# Patient Record
Sex: Male | Born: 1970 | Race: White | Hispanic: No | Marital: Married | Smoking: Never smoker
Health system: Southern US, Community
[De-identification: ages and names within clinical notes are randomized; demographics above are authoritative.]

## PROBLEM LIST (undated history)

## (undated) DIAGNOSIS — C801 Malignant (primary) neoplasm, unspecified: Secondary | ICD-10-CM

## (undated) DIAGNOSIS — N529 Male erectile dysfunction, unspecified: Secondary | ICD-10-CM

## (undated) DIAGNOSIS — G4730 Sleep apnea, unspecified: Secondary | ICD-10-CM

## (undated) HISTORY — DX: Male erectile dysfunction, unspecified: N52.9

## (undated) HISTORY — PX: SPINE SURGERY: SHX786

---

## 1998-07-24 HISTORY — PX: FOOT SURGERY: SHX648

## 2001-09-13 ENCOUNTER — Encounter: Payer: Self-pay | Admitting: Neurological Surgery

## 2001-09-13 ENCOUNTER — Ambulatory Visit (HOSPITAL_COMMUNITY): Admission: RE | Admit: 2001-09-13 | Discharge: 2001-09-13 | Payer: Self-pay | Admitting: Neurological Surgery

## 2001-10-01 ENCOUNTER — Inpatient Hospital Stay (HOSPITAL_COMMUNITY): Admission: RE | Admit: 2001-10-01 | Discharge: 2001-10-04 | Payer: Self-pay | Admitting: Neurological Surgery

## 2001-10-01 ENCOUNTER — Encounter: Payer: Self-pay | Admitting: Neurological Surgery

## 2009-12-10 DIAGNOSIS — N529 Male erectile dysfunction, unspecified: Secondary | ICD-10-CM | POA: Insufficient documentation

## 2009-12-10 DIAGNOSIS — F528 Other sexual dysfunction not due to a substance or known physiological condition: Secondary | ICD-10-CM | POA: Insufficient documentation

## 2011-09-08 ENCOUNTER — Ambulatory Visit (INDEPENDENT_AMBULATORY_CARE_PROVIDER_SITE_OTHER): Payer: 59 | Admitting: Physician Assistant

## 2011-09-08 VITALS — BP 137/80 | HR 79 | Temp 98.1°F | Resp 18 | Ht 69.0 in | Wt 248.6 lb

## 2011-09-08 DIAGNOSIS — G4700 Insomnia, unspecified: Secondary | ICD-10-CM | POA: Insufficient documentation

## 2011-09-08 DIAGNOSIS — J020 Streptococcal pharyngitis: Secondary | ICD-10-CM

## 2011-09-08 MED ORDER — PENICILLIN G BENZATHINE 1200000 UNIT/2ML IM SUSP
1.2000 10*6.[IU] | Freq: Once | INTRAMUSCULAR | Status: AC
  Administered 2011-09-08: 1.2 10*6.[IU] via INTRAMUSCULAR

## 2011-09-08 NOTE — Progress Notes (Signed)
Patient left approximately 5 min after injection of bicillin LA. He was advised by both myself and Benny Lennert that he needed to wait 15 minutes in case there was any type of reaction. Patient stated there had already been a time warp here and he had been here for hours. Patient was told again that it was not advisable but he left anyway.  [name], CMA

## 2011-09-08 NOTE — Progress Notes (Signed)
  Subjective:    Patient ID: Gerald [name], male    DOB: 1971-02-01, 41 y.o.   MRN: [id number]  HPI [facility] and tested positive for strep by culture.  He has been taking clindamycin for 4 days bid (though Rx qid) but feels no better and yesterday developed a rash (hives that were really itchy just across chest), now worried he has an allergy and very frustrated that he is not better.  He had a nose sore about 1 month ago and was put on bactroban prior to this sore throat. He has no SOB, no feelings of throat edema.     Review of Systems  Constitutional: Negative for fever and chills.  HENT: Positive for sore throat. Negative for drooling and trouble swallowing.   Respiratory: Negative for chest tightness and shortness of breath.        Objective:   Physical Exam  Constitutional: He is oriented to person, place, and time. He appears well-developed and well-nourished.  HENT:  Head: Normocephalic and atraumatic.  Right Ear: External ear normal.  Left Ear: External ear normal.  Mouth/Throat: Oropharyngeal exudate and posterior oropharyngeal erythema present.  Cardiovascular: Normal rate, regular rhythm and normal heart sounds.   Pulmonary/Chest: Effort normal and breath sounds normal. No respiratory distress. He has no wheezes.  Neurological: He is alert and oriented to person, place, and time. He has normal reflexes.  Skin: Skin is warm and dry.     Psychiatric: He has a normal mood and affect. His behavior is normal. Judgment and thought content normal.          Assessment & Plan:   1. Strep pharyngitis  penicillin g benzathine (BICILLIN LA) 1200000 UNIT/2ML injection 1.2 Million Units  2. Insomnia     I expect pt is not better due to inadequate abx dosage.  ? True allergy b/c of distribution but b/c hives will document as an allergy.  Will give Bicillin tonight.  Pt agrees with plan.   Pt immediately left against medical advice following  administration of Bicillin .  Gerald [name] asked him to stay for 15 mins after the injection of Bicillin as per protocol (and as I had instructed he would have to prior to the injection being ordered/given) but he refused and walked out of the room, stating that his 15 mins had occurred prior to the injection because we were so slow.  I could not catch the patient and try to convince him to stay.

## 2012-08-30 ENCOUNTER — Ambulatory Visit: Payer: 59

## 2012-08-30 ENCOUNTER — Ambulatory Visit (INDEPENDENT_AMBULATORY_CARE_PROVIDER_SITE_OTHER): Payer: 59 | Admitting: Emergency Medicine

## 2012-08-30 VITALS — BP 130/90 | HR 86 | Temp 98.4°F | Resp 16 | Ht 67.0 in | Wt 240.0 lb

## 2012-08-30 DIAGNOSIS — N529 Male erectile dysfunction, unspecified: Secondary | ICD-10-CM

## 2012-08-30 DIAGNOSIS — M25579 Pain in unspecified ankle and joints of unspecified foot: Secondary | ICD-10-CM

## 2012-08-30 MED ORDER — SILDENAFIL CITRATE 100 MG PO TABS: 100.0000 mg | ORAL_TABLET | Freq: Every day | ORAL | Status: DC | PRN

## 2012-08-30 MED ORDER — MELOXICAM 7.5 MG PO TABS: 7.5000 mg | ORAL_TABLET | Freq: Every day | ORAL | Status: DC

## 2012-08-30 NOTE — Progress Notes (Signed)
   [address], [place] [address]   Phone [phone]  Subjective:    Patient ID: Gerald [name], male    DOB: 06-27-1971, 42 y.o.   MRN: [id number]  HPI Pt presents to clinic with R ankle pain that he gets every once in a while.  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  He typically can get it to stop hurting and swelling by wrapping it for a couple of days but this time he feels like he needs NSAID.  He has taken no medications.  He is also on Viagra and 50mg  works great.  He wakes up in the am with soft erections and during stimulation he can get an erection but he cannot maintain one.  He would like a refill of his viagra.  His last testosterone was several years ago and it was normal.  He works on [employer] and runs his own blood every once in a while and he has normal labs.   Review of Systems  Musculoskeletal: Positive for joint swelling. Gait problem: 2nd to pain.       Objective:   Physical Exam  Vitals reviewed. Constitutional: He appears well-developed and well-nourished.  HENT:  Head: Normocephalic and atraumatic.  Right Ear: External ear normal.  Left Ear: External ear normal.  Cardiovascular: Normal rate, regular rhythm and normal heart sounds.   No murmur heard. Pulmonary/Chest: Effort normal and breath sounds normal.  Musculoskeletal:       Right ankle: He exhibits decreased range of motion and swelling (mild). He exhibits no deformity. No lateral malleolus and no medial malleolus tenderness found.       Feet:  Skin: Skin is warm and dry.  Psychiatric: He has a normal mood and affect. His behavior is normal. Judgment and thought content normal.   [facility] reading (PRIMARY) by  Dr. Cleta Alberts. Anchor screw in place - no acute findings.     Assessment & Plan:   1. Ankle pain  DG Ankle Complete Right, sildenafil (VIAGRA) 100 MG tablet,  meloxicam (MOBIC) 7.5 MG tablet  2. Impotence due to erectile dysfunction  DG Ankle Complete Right   Suggested pt RTC for CPE but doubt he will because he stated no need because his labs are ok.  Gave meds with refills. Pt to continue with brace and ice and rest.  F/u if problems.

## 2013-06-06 ENCOUNTER — Ambulatory Visit (INDEPENDENT_AMBULATORY_CARE_PROVIDER_SITE_OTHER): Payer: 59 | Admitting: Emergency Medicine

## 2013-06-06 VITALS — BP 110/80 | HR 76 | Temp 98.4°F | Resp 16 | Ht 69.0 in | Wt 254.0 lb

## 2013-06-06 DIAGNOSIS — J018 Other acute sinusitis: Secondary | ICD-10-CM

## 2013-06-06 DIAGNOSIS — J309 Allergic rhinitis, unspecified: Secondary | ICD-10-CM

## 2013-06-06 MED ORDER — PSEUDOEPHEDRINE-GUAIFENESIN ER 60-600 MG PO TB12: 1.0000 | ORAL_TABLET | Freq: Two times a day (BID) | ORAL | Status: AC

## 2013-06-06 MED ORDER — FLUTICASONE PROPIONATE 50 MCG/ACT NA SUSP: 2.0000 | Freq: Every day | NASAL | Status: DC

## 2013-06-06 MED ORDER — AMOXICILLIN-POT CLAVULANATE 875-125 MG PO TABS: 1.0000 | ORAL_TABLET | Freq: Two times a day (BID) | ORAL | Status: DC

## 2013-06-06 NOTE — Progress Notes (Signed)
[facility] [address], [place] [address] [phone]  Date:  06/06/2013   Name:  Gerald [name]   DOB:  [birth year]   MRN:  [id number]  PCP:  No primary provider on file.    Chief Complaint: Sore Throat, Diarrhea and Head congestion   History of Present Illness:  Gerald [name] is a 42 y.o. very pleasant male patient who presents with the following:  Purulent nasal drainage with congestion and a post nasal drainage.  Has a sore throat.  No fever or chills. No nausea or vomiting.  No rash has a cough that is productive of purulent sputum occasionally.  No wheezing or shortness of breath.  No improvement with over the counter medications or other home remedies. Denies other complaint or health concern today.   Patient Active Problem List   Diagnosis Date Noted  . Impotence due to erectile dysfunction 08/30/2012  . Insomnia 09/08/2011    History reviewed. No pertinent past medical history.  Past Surgical History  Procedure Laterality Date  . Spine surgery    . Foot surgery  2000    extra nivicular removal    History  Substance Use Topics  . Smoking status: Never Smoker   . Smokeless tobacco: Not on file  . Alcohol Use: Not on file    History reviewed. No pertinent family history.  No Known Allergies  Medication list has been reviewed and updated.  Current Outpatient Prescriptions on File Prior to Visit  Medication Sig Dispense Refill  . sildenafil (VIAGRA) 100 MG tablet Take 1 tablet (100 mg total) by mouth daily as needed.  8 tablet  1   No current facility-administered medications on file prior to visit.    Review of Systems:  As per HPI, otherwise negative.    Physical Examination: Filed Vitals:   06/06/13 1302  BP: 110/80  Pulse: 76  Temp: 98.4 F (36.9 C)  Resp: 16   Filed Vitals:   06/06/13 1302  Height: 5\' 9"  (1.753 m)  Weight: 254 lb (115.214 kg)   Body mass index is 37.49 kg/(m^2). Ideal Body Weight: Weight in  (lb) to have BMI = 25: 168.9  GEN: WDWN, NAD, Non-toxic, A & O x 3 HEENT: Atraumatic, Normocephalic. Neck supple. No masses, No LAD. Ears and Nose: No external deformity. CV: RRR, No M/G/R. No JVD. No thrill. No extra heart sounds. PULM: CTA B, no wheezes, crackles, rhonchi. No retractions. No resp. distress. No accessory muscle use. ABD: S, NT, ND, +BS. No rebound. No HSM. EXTR: No c/c/e NEURO Normal gait.  PSYCH: Normally interactive. Conversant. Not depressed or anxious appearing.  Calm demeanor.    Assessment and Plan: Sinusitis augmentin mucinex flonase  Signed,  Phillips Odor, MD

## 2013-06-06 NOTE — Patient Instructions (Signed)

## 2014-10-14 ENCOUNTER — Encounter (HOSPITAL_COMMUNITY): Payer: Self-pay | Admitting: Emergency Medicine

## 2014-10-14 ENCOUNTER — Emergency Department (HOSPITAL_COMMUNITY)
Admission: EM | Admit: 2014-10-14 | Discharge: 2014-10-14 | Disposition: A | Discharge status: Home / Self Care | Payer: 59 | Attending: Emergency Medicine | Admitting: Emergency Medicine

## 2014-10-14 DIAGNOSIS — L02215 Cutaneous abscess of perineum: Secondary | ICD-10-CM | POA: Diagnosis present

## 2014-10-14 DIAGNOSIS — L03818 Cellulitis of other sites: Secondary | ICD-10-CM | POA: Insufficient documentation

## 2014-10-14 DIAGNOSIS — Z7951 Long term (current) use of inhaled steroids: Secondary | ICD-10-CM | POA: Diagnosis not present

## 2014-10-14 DIAGNOSIS — Z792 Long term (current) use of antibiotics: Secondary | ICD-10-CM | POA: Insufficient documentation

## 2014-10-14 MED ORDER — LIDOCAINE-EPINEPHRINE 2 %-1:100000 IJ SOLN
20.0000 mL | Freq: Once | INTRAMUSCULAR | Status: AC
  Administered 2014-10-14: 20 mL
  Filled 2014-10-14: qty 1

## 2014-10-14 MED ORDER — HYDROCODONE-ACETAMINOPHEN 5-325 MG PO TABS: 1.0000 | ORAL_TABLET | ORAL | Status: DC | PRN

## 2014-10-14 NOTE — ED Notes (Signed)
Pt states he has an abcess behind his scrotum. Was seen yesterday at [facility] and had it lanced but states that the PA was not able to get much out of it. Is on antibiotics but states it has doubled in size. Alert and oriented.

## 2014-10-14 NOTE — ED Provider Notes (Signed)
CSN: [id number]     Arrival date & time 10/14/14  0148 History   First MD Initiated Contact with Patient 10/14/14 [phone]     Chief Complaint  Patient presents with  . Skin Abcess      (Consider location/radiation/quality/duration/timing/severity/associated sxs/prior Treatment) HPI Comments: Patient with abscess on abdominal wall in the mons area  And one under scrotum not involving scrotal wall  Had these lanced wt [facility] and started on Sulfa and T#3 without relief of discomfort  Denies dysuria, constipation testicular pain   The history is provided by the patient.    History reviewed. No pertinent past medical history. Past Surgical History  Procedure Laterality Date  . Spine surgery    . Foot surgery  2000    extra nivicular removal   History reviewed. No pertinent family history. History  Substance Use Topics  . Smoking status: Never Smoker   . Smokeless tobacco: Not on file  . Alcohol Use: Not on file    Review of Systems  Constitutional: Negative for fever.  Genitourinary: Negative for dysuria, frequency, hematuria, flank pain, discharge, penile swelling, scrotal swelling, genital sores, penile pain and testicular pain.  Skin: Positive for wound.  All other systems reviewed and are negative.     Allergies  Review of patient's allergies indicates no known allergies.  Home Medications   Prior to Admission medications   Medication Sig Start Date End Date Taking? Authorizing Provider  amoxicillin-clavulanate (AUGMENTIN) 875-125 MG per tablet Take 1 tablet by mouth 2 (two) times daily. 06/06/13   Roselee Culver, MD  fluticasone (FLONASE) 50 MCG/ACT nasal spray Place 2 sprays into both nostrils daily. 06/06/13   Roselee Culver, MD  HYDROcodone-acetaminophen (NORCO/VICODIN) 5-325 MG per tablet Take 1-2 tablets by mouth every 4 (four) hours as needed for moderate pain. 10/14/14   Junius Creamer, NP  sildenafil (VIAGRA) 100 MG tablet Take 1 tablet (100 mg total) by mouth  daily as needed. 08/30/12   Mancel Bale, PA-C   BP 153/98 mmHg  Pulse 113  Temp(Src) 98.8 F (37.1 C) (Oral)  SpO2 98% Physical Exam  Constitutional: He is oriented to person, place, and time. He appears well-developed and well-nourished.  HENT:  Head: Normocephalic.  Eyes: Pupils are equal, round, and reactive to light.  Neck: Normal range of motion.  Cardiovascular: Normal rate.   Pulmonary/Chest: Effort normal.  Abdominal: Soft. He exhibits no distension. There is no tenderness.  Genitourinary: Penis normal.    Right testis shows no tenderness. Cremasteric reflex is not absent on the right side. Left testis shows no tenderness. Cremasteric reflex is not absent on the left side. No penile tenderness.     Musculoskeletal: Normal range of motion.  Neurological: He is alert and oriented to person, place, and time.  Skin: Skin is warm. There is erythema.  Nursing note and vitals reviewed.   ED Course  INCISION AND DRAINAGE Date/Time: 10/14/2014 3:14 AM Performed by: Junius Creamer Authorized by: Junius Creamer Consent: Verbal consent obtained. Written consent not obtained. Risks and benefits: risks, benefits and alternatives were discussed Consent given by: patient Patient understanding: patient states understanding of the procedure being performed Patient identity confirmed: verbally with patient Time out: Immediately prior to procedure a "time out" was called to verify the correct patient, procedure, equipment, support staff and site/side marked as required. Type: abscess Anesthesia: local infiltration Local anesthetic: lidocaine 1% with epinephrine Anesthetic total: 2 ml Patient sedated: no Scalpel size: 11 Needle gauge: 22 Incision type:  single straight Complexity: simple Drainage: purulent Drainage amount: moderate Wound treatment: wound left open Patient tolerance: Patient tolerated the procedure well with no immediate complications Comments: I&D of perineal  abscess 3 cc lidocine with epi 11 blade.22gague needle Moderate draining Packed with 1/4 packing    (including critical care time) Labs Review Labs Reviewed - No data to display  Imaging Review No results found.   EKG Interpretation None     Patient is to continue antibiotic, warm compresses to the area 3-4 times daily He is to avoid baths, pools or ocean bathing  MDM   Final diagnoses:  Abscess of perineum  Cellulitis of other specified site         Junius Creamer, NP 10/14/14 [place], MD 10/14/14 0700

## 2014-10-14 NOTE — Discharge Instructions (Signed)
Cellulitis Cellulitis is an infection of the skin and the tissue under the skin. The infected area is usually red and tender. This happens most often in the arms and lower legs. HOME CARE   Take your antibiotic medicine as told. Finish the medicine even if you start to feel better.  Keep the infected arm or leg raised (elevated).  Put a warm cloth on the area up to 4 times per day.  Only take medicines as told by your doctor.  Keep all doctor visits as told. GET HELP IF:  You see red streaks on the skin coming from the infected area.  Your red area gets bigger or turns a dark color.  Your bone or joint under the infected area is painful after the skin heals.  Your infection comes back in the same area or different area.  You have a puffy (swollen) bump in the infected area.  You have new symptoms.  You have a fever. GET HELP RIGHT AWAY IF:   You feel very sleepy.  You throw up (vomit) or have watery poop (diarrhea).  You feel sick and have muscle aches and pains. MAKE SURE YOU:   Understand these instructions.  Will watch your condition.  Will get help right away if you are not doing well or get worse. Document Released: 12/27/2007 Document Revised: 11/24/2013 Document Reviewed: 09/25/2011 [facility] Patient Information 2015 [place], [place]. This information is not intended to replace advice given to you by your health care provider. Make sure you discuss any questions you have with your health care provider.  Abscess Care After An abscess (also called a boil or furuncle) is an infected area that contains a collection of pus. Signs and symptoms of an abscess include pain, tenderness, redness, or hardness, or you may feel a moveable soft area under your skin. An abscess can occur anywhere in the body. The infection may spread to surrounding tissues causing cellulitis. A cut (incision) by the surgeon was made over your abscess and the pus was drained out. Gauze may have  been packed into the space to provide a drain that will allow the cavity to heal from the inside outwards. The boil may be painful for 5 to 7 days. Most people with a boil do not have high fevers. Your abscess, if seen early, may not have localized, and may not have been lanced. If not, another appointment may be required for this if it does not get better on its own or with medications. HOME CARE INSTRUCTIONS   Only take over-the-counter or prescription medicines for pain, discomfort, or fever as directed by your caregiver.  When you bathe, soak and then remove gauze or iodoform packs at least daily or as directed by your caregiver. You may then wash the wound gently with mild soapy water. Repack with gauze or do as your caregiver directs. SEEK IMMEDIATE MEDICAL CARE IF:   You develop increased pain, swelling, redness, drainage, or bleeding in the wound site.  You develop signs of generalized infection including muscle aches, chills, fever, or a general ill feeling.  An oral temperature above 102 F (38.9 C) develops, not controlled by medication. See your caregiver for a recheck if you develop any of the symptoms described above. If medications (antibiotics) were prescribed, take them as directed. Document Released: 01/26/2005 Document Revised: 10/02/2011 Document Reviewed: 09/23/2007 [facility] Patient Information 2015 [place], [place]. This information is not intended to replace advice given to you by your health care provider. Make sure you discuss  any questions you have with your health care provider.  You may shower but no sitting in water. Pool or ocean til no longer draining

## 2020-10-26 ENCOUNTER — Other Ambulatory Visit: Payer: Self-pay | Admitting: Otolaryngology

## 2020-10-26 DIAGNOSIS — K1120 Sialoadenitis, unspecified: Secondary | ICD-10-CM

## 2020-11-09 ENCOUNTER — Ambulatory Visit
Admission: RE | Admit: 2020-11-09 | Discharge: 2020-11-09 | Disposition: A | Discharge status: Home / Self Care | Payer: Managed Care, Other (non HMO) | Source: Ambulatory Visit | Attending: Otolaryngology | Admitting: Otolaryngology

## 2020-11-09 ENCOUNTER — Other Ambulatory Visit: Payer: Self-pay

## 2020-11-09 DIAGNOSIS — K1120 Sialoadenitis, unspecified: Secondary | ICD-10-CM

## 2020-11-09 MED ORDER — IOPAMIDOL (ISOVUE-300) INJECTION 61%
75.0000 mL | Freq: Once | INTRAVENOUS | Status: AC | PRN
  Administered 2020-11-09: 75 mL via INTRAVENOUS

## 2020-11-11 ENCOUNTER — Encounter (HOSPITAL_COMMUNITY): Payer: Self-pay

## 2020-11-11 ENCOUNTER — Ambulatory Visit (HOSPITAL_COMMUNITY)
Admission: EM | Admit: 2020-11-11 | Discharge: 2020-11-11 | Disposition: A | Discharge status: Home / Self Care | Payer: Managed Care, Other (non HMO)

## 2020-11-11 ENCOUNTER — Encounter (HOSPITAL_COMMUNITY): Payer: Self-pay | Admitting: Emergency Medicine

## 2020-11-11 ENCOUNTER — Other Ambulatory Visit: Payer: Self-pay

## 2020-11-11 ENCOUNTER — Emergency Department (HOSPITAL_COMMUNITY)
Admission: EM | Admit: 2020-11-11 | Discharge: 2020-11-11 | Disposition: A | Discharge status: Home / Self Care | Payer: Managed Care, Other (non HMO) | Attending: Emergency Medicine | Admitting: Emergency Medicine

## 2020-11-11 ENCOUNTER — Emergency Department (HOSPITAL_COMMUNITY): Payer: Managed Care, Other (non HMO)

## 2020-11-11 DIAGNOSIS — H9209 Otalgia, unspecified ear: Secondary | ICD-10-CM | POA: Insufficient documentation

## 2020-11-11 DIAGNOSIS — R070 Pain in throat: Secondary | ICD-10-CM | POA: Diagnosis not present

## 2020-11-11 DIAGNOSIS — Z5321 Procedure and treatment not carried out due to patient leaving prior to being seen by health care provider: Secondary | ICD-10-CM | POA: Insufficient documentation

## 2020-11-11 DIAGNOSIS — J392 Other diseases of pharynx: Secondary | ICD-10-CM

## 2020-11-11 DIAGNOSIS — R042 Hemoptysis: Secondary | ICD-10-CM | POA: Diagnosis not present

## 2020-11-11 LAB — CBC WITH DIFFERENTIAL/PLATELET
Abs Immature Granulocytes: 0.03 10*3/uL (ref 0.00–0.07)
Basophils Absolute: 0.1 10*3/uL (ref 0.0–0.1)
Basophils Relative: 1 %
Eosinophils Absolute: 0.2 10*3/uL (ref 0.0–0.5)
Eosinophils Relative: 2 %
HCT: 44.2 % (ref 39.0–52.0)
Hemoglobin: 15.5 g/dL (ref 13.0–17.0)
Immature Granulocytes: 0 %
Lymphocytes Relative: 30 %
Lymphs Abs: 3.1 10*3/uL (ref 0.7–4.0)
MCH: 33.6 pg (ref 26.0–34.0)
MCHC: 35.1 g/dL (ref 30.0–36.0)
MCV: 95.9 fL (ref 80.0–100.0)
Monocytes Absolute: 0.6 10*3/uL (ref 0.1–1.0)
Monocytes Relative: 6 %
Neutro Abs: 6.2 10*3/uL (ref 1.7–7.7)
Neutrophils Relative %: 61 %
Platelets: 251 10*3/uL (ref 150–400)
RBC: 4.61 MIL/uL (ref 4.22–5.81)
RDW: 12.3 % (ref 11.5–15.5)
WBC: 10.1 10*3/uL (ref 4.0–10.5)
nRBC: 0 % (ref 0.0–0.2)

## 2020-11-11 LAB — BASIC METABOLIC PANEL
Anion gap: 6 (ref 5–15)
BUN: 10 mg/dL (ref 6–20)
CO2: 26 mmol/L (ref 22–32)
Calcium: 8.8 mg/dL — ABNORMAL LOW (ref 8.9–10.3)
Chloride: 105 mmol/L (ref 98–111)
Creatinine, Ser: 1.09 mg/dL (ref 0.61–1.24)
GFR, Estimated: >60 mL/min (ref >60)
Glucose, Bld: 91 mg/dL (ref 70–99)
Potassium: 3.9 mmol/L (ref 3.5–5.1)
Sodium: 137 mmol/L (ref 135–145)

## 2020-11-11 NOTE — ED Triage Notes (Signed)
Emergency Medicine Provider Triage Evaluation Note  Andon Villard , a 50 y.o. male  was evaluated in triage.  Pt complains of coughing up blood today. Recently diagnosed with tonsil mass on CT, his ENT (Dr. Melene Plan) is out of town, scheduled for appointment on Monday. Has area to left posterior tongue that is bleeding, thinks this is why he coughed up blood but came in to be sure.  Review of Systems  Positive: hemoptysis  Negative: Difficulty breathing  Physical Exam  BP (!) 148/107 (BP Location: Right Arm)   Pulse 83   Temp 98.8 F (37.1 C) (Oral)   Resp 20   SpO2 98%  Gen:   Awake, no distress   HEENT:  Macerated area to left posterior tongue, no visible bleeding  Resp:  Normal effort  Cardiac:  Normal rate  Abd:   Nondistended, nontender  MSK:   Moves extremities without difficulty  Neuro:  Speech clear   Medical Decision Making  Medically screening exam initiated at 7:41 PM.  Appropriate orders placed.  Awad Gladd was informed that the remainder of the evaluation will be completed by another provider, this initial triage assessment does not replace that evaluation, and the importance of remaining in the ED until their evaluation is complete.  Clinical Impression     Roque Lias 11/11/20 1943

## 2020-11-11 NOTE — ED Provider Notes (Signed)
[facility] [place]    CSN: [id number] Arrival date & time: 11/11/20  1830      History   Chief Complaint Chief Complaint  Patient presents with  . Cough  . Hemoptysis    HPI Gerald [name] is a 50 y.o. male presenting with hemoptysis and throat pain.  This patient is currently being evaluated for neck mass by ENT, CT was performed 2 days ago with results as below.  Patient states that he is currently followed by ENT for 6 months of a neck mass and sore throat.  States that he has been treated for sialadenitis with multiple rounds of antibiotics without improvement.  States that he noticed taste of blood in his mouth 4 days ago, and has coughed up several blood clots.  Denies cough or respiratory symptoms, states that he thinks the blood is coming from a lesion at the base of his tongue.  Denies shortness of breath, trouble swallowing, dizziness, chest pain.  States he did call his ear nose and throat doctor but they are on vacation and cannot see him.  HPI  History reviewed. No pertinent past medical history.  Patient Active Problem List   Diagnosis Date Noted  . Impotence due to erectile dysfunction 08/30/2012  . Insomnia 09/08/2011    Past Surgical History:  Procedure Laterality Date  . FOOT SURGERY  2000   extra nivicular removal  . SPINE SURGERY         Home Medications    Prior to Admission medications   Medication Sig Start Date End Date Taking? Authorizing Provider  fluticasone (FLONASE) 50 MCG/ACT nasal spray Place 2 sprays into both nostrils daily. 06/06/13 11/11/20  Roselee Culver, MD  sildenafil (VIAGRA) 100 MG tablet Take 1 tablet (100 mg total) by mouth daily as needed. 08/30/12 11/11/20  Mancel Bale, PA-C    Family History No family history on file.  Social History Social History   Tobacco Use  . Smoking status: Never Smoker  . Smokeless tobacco: Never Used  Vaping Use  . Vaping Use: Never used  Substance Use Topics  . Alcohol use: Yes     Comment: rare  . Drug use: Never     Allergies   Patient has no known allergies.   Review of Systems Review of Systems  Musculoskeletal:       Tongue lesion Neck mass  All other systems reviewed and are negative.    Physical Exam Triage Vital Signs ED Triage Vitals  Enc Vitals Group     BP      Pulse      Resp      Temp      Temp src      SpO2      Weight      Height      Head Circumference      Peak Flow      Pain Score      Pain Loc      Pain Edu?      Excl. in [place]?    No data found.  Updated Vital Signs BP 130/85 (BP Location: Right Arm) Comment (BP Location): large cuff  Pulse 79   Temp 99.2 F (37.3 C) (Oral)   Resp 20   Visual Acuity Right Eye Distance:   Left Eye Distance:   Bilateral Distance:    Right Eye Near:   Left Eye Near:    Bilateral Near:     Physical Exam Vitals reviewed.  Constitutional:  General: He is not in acute distress.    Appearance: Normal appearance. He is not ill-appearing or diaphoretic.  HENT:     Head: Normocephalic and atraumatic.     Mouth/Throat:      Comments: Base of L tongue with are of bleeding and friability  Eyes:     Extraocular Movements: Extraocular movements intact.     Pupils: Pupils are equal, round, and reactive to light.  Neck:     Comments: Area of swelling and tenderness L chin Cardiovascular:     Rate and Rhythm: Normal rate and regular rhythm.     Heart sounds: Normal heart sounds.  Pulmonary:     Effort: Pulmonary effort is normal.     Breath sounds: Normal breath sounds.  Skin:    General: Skin is warm.  Neurological:     General: No focal deficit present.     Mental Status: He is alert and oriented to person, place, and time.  Psychiatric:        Mood and Affect: Mood normal.        Behavior: Behavior normal.        Thought Content: Thought content normal.        Judgment: Judgment normal.      UC Treatments / Results  Labs (all labs ordered are listed, but only  abnormal results are displayed) Labs Reviewed - No data to display  EKG   Radiology No results found.  Procedures Procedures (including critical care time)  Medications Ordered in UC Medications - No data to display  Initial Impression / Assessment and Plan / UC Course  I have reviewed the triage vital signs and the nursing notes.  Pertinent labs & imaging results that were available during my care of the patient were reviewed by me and considered in my medical decision making (see chart for details).     This patient is a 50 year old male presenting with neck mass x6 months and "hemoptysis" x4 days. Today this pt is afebrile nontachycardic nontachypneic, oxygenating well on room air, no wheezes rhonchi or rales. This patient has been followed by ENT for 6 months for "sialadenitis" and has been treated with multiple rounds of abx per pt. Unfortunately I do not have access to their notes. 4/19 CT as below; pt has not been given these results yet. On exam, there is a lesion at the base of his tongue that appears to be bleeding. However, I cannot rule out additional sources of bleeding and airway compromise, and so I am recommending this patient head to [facility] ED for further evaluation and management. He is in agreement with this treatment plan. He is hemodynamically stable for transport to ED in personal vehicle at this time.   CT neck 11/09/20 IMPRESSION: 1. 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 2. 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  Final Clinical Impressions(s) / UC Diagnoses   Final diagnoses:  Pharyngeal lesion     Discharge Instructions     -Head straight to [facility] ED for further evaluation of coughing up blood     ED Prescriptions    None     PDMP not reviewed this encounter.   Hazel Sams, PA-C 11/11/20 1923

## 2020-11-11 NOTE — ED Notes (Signed)
Notified laura, pa of patient's complaint

## 2020-11-11 NOTE — ED Triage Notes (Signed)
Pt states he is seeing ENT for a lump in throat, states it may be a salivary gland stone. Today while standing in garage on phone, he started coughing up blood. Pt has picture of bright red small blood clot he coughed up. Pt states he had same a few days ago but it went away. Pt c/o throat and ear pain.

## 2020-11-11 NOTE — Discharge Instructions (Signed)
-  Head straight to [facility] ED for further evaluation of coughing up blood

## 2020-11-11 NOTE — ED Triage Notes (Addendum)
Patient has been seeing an ent for a lump on left side of neck.  Patient had a ct on Monday.  Patient had blood streaks to sputum on Monday, ent did not agree to appt.  This afternoon, patient coughed up small clot.  Patient reports increased pain and taste of blood in left side of throat.  Patient has picture of bright red, small clot on phone

## 2020-11-11 NOTE — ED Notes (Signed)
Patient is being discharged from the Urgent Care and sent to the Emergency Department via POV . Per Olene Craven, PA, patient is in need of higher level of care due to coughing up blood. Patient is aware and verbalizes understanding of plan of care.  Vitals:   11/11/20 1859  BP: 130/85  Pulse: 79  Resp: 20  Temp: 99.2 F (37.3 C)

## 2020-11-11 NOTE — ED Notes (Signed)
Pt inquiring about wait time, pt stated that he has stopped spitting up blood and wants to leave. Pt advised against leaving, but he decided to leave.

## 2020-11-12 ENCOUNTER — Telehealth: Payer: Managed Care, Other (non HMO) | Admitting: Physician Assistant

## 2020-11-12 DIAGNOSIS — J358 Other chronic diseases of tonsils and adenoids: Secondary | ICD-10-CM | POA: Diagnosis not present

## 2020-11-12 NOTE — Progress Notes (Signed)
Mr. Gerald [name], Gerald [name] are scheduled for a virtual visit with your provider today.    Just as we do with appointments in the office, we must obtain your consent to participate.  Your consent will be active for this visit and any virtual visit you may have with one of our providers in the next 365 days.    If you have a MyChart account, I can also send a copy of this consent to you electronically.  All virtual visits are billed to your insurance company just like a traditional visit in the office.  As this is a virtual visit, video technology does not allow for your provider to perform a traditional examination.  This may limit your provider's ability to fully assess your condition.  If your provider identifies any concerns that need to be evaluated in person or the need to arrange testing such as labs, EKG, etc, we will make arrangements to do so.    Although advances in technology are sophisticated, we cannot ensure that it will always work on either your end or our end.  If the connection with a video visit is poor, we may have to switch to a telephone visit.  With either a video or telephone visit, we are not always able to ensure that we have a secure connection.   I need to obtain your verbal consent now.   Are you willing to proceed with your visit today?   Gerald [name] has provided verbal consent on 11/12/2020 for a virtual visit (video or telephone).  Gerald Rio, PA-C 11/12/2020  9:32 AM  Virtual Visit via Video   I connected with patient on 11/12/20 at  9:30 AM EDT by a video enabled telemedicine application and verified that I am speaking with the correct person using two identifiers.  Location patient: Home Location provider: [place] participating in the virtual visit: Patient, Provider  I discussed the limitations of evaluation and management by telemedicine and the availability of in person appointments. The patient expressed understanding and agreed to  proceed.  Subjective:   HPI:   Patient presents via [facility] today with concerns regarding a recent CT head and neck that he had on 11/09/2020. Notes he has been dealing with ongoing issue of left sided sore throat with a palpable mass of the neck. Was initially diagnosed with sialadenitis and treated with rounds of antibiotics without improvement. Was referred to ENT, Dr. Leta Baptist, who ordered the CT scan 10/26/2020. Patient states yesterday he noted he was getting blood up when coughing so went to a local Urgent Care for evaluation. Workup revealed a friable ulcerated lesion in the back of this throat. Was sent to ER for further evaluation giving hemoptysis. ER workup included labs (overall unremarkable except for borderline low calcium) and CXR which was negative for any acute findings or mass. Patient left after waiting over 5 hours. No residual hemoptysis. States he is just very worried as he had not been contacted by his ENT for CT result, noting he is now able to see it on MyChart and sees mention of malignant mass and lymph nodes. He contacted his ENT office but the provider is out of state until Sunday and they told him there was no covering provider. He states he is very concerned and does not know if he needs to wait until his follow-up appointment Monday or go back to the ER. He says he is very upset that he had his CT earlier in the  week and no one told him the provider would not be in to review the test.     ROS:   See pertinent positives and negatives per HPI.  Patient Active Problem List   Diagnosis Date Noted  . Impotence due to erectile dysfunction 08/30/2012  . Insomnia 09/08/2011    Social History   Tobacco Use  . Smoking status: Never Smoker  . Smokeless tobacco: Never Used  Substance Use Topics  . Alcohol use: Yes    Comment: rare   No current outpatient medications on file.  No Known Allergies  Objective:   There were no vitals taken for this visit.  Patient is  well-developed, well-nourished in no acute distress. Mildly anxious.  Resting comfortably at home.  Head is normocephalic, atraumatic.  No labored breathing.  Speech is clear and coherent with logical content.  Patient is alert and oriented at baseline.   Assessment and Plan:   1. 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 No contralateral adenopathy. Patient had already seen results via MyChart and unable to get advice from ENT as provider is out of town. While on video I encouraged patient not to go to the ER unless there was any significant issue -- recurrence of hemoptysis, fever, chills, etc, as they will not likely do anything except have him follow-up with his specialist. I contacted practice who verified provider is out of town and not able to receive messages and that there was no covering provider. Made sure patient had appointment Monday for further evaluation and management. RN agreed to answer any other questions the patient had in regards to delay in result response. Called patient back and reviewed this with him in detail. He is currently between PCP and recommendations given for [facility] primary providers giving his interest.  Requested patient send me a message to let me know he is doing and if he needs any other recommendations for a primary care provider.   Gerald [name], [place] 11/12/2020

## 2020-11-16 ENCOUNTER — Other Ambulatory Visit: Payer: Self-pay

## 2020-11-16 ENCOUNTER — Encounter (HOSPITAL_BASED_OUTPATIENT_CLINIC_OR_DEPARTMENT_OTHER): Payer: Self-pay | Admitting: Otolaryngology

## 2020-11-16 ENCOUNTER — Other Ambulatory Visit: Payer: Self-pay | Admitting: Otolaryngology

## 2020-11-16 DIAGNOSIS — J358 Other chronic diseases of tonsils and adenoids: Secondary | ICD-10-CM

## 2020-11-16 HISTORY — DX: Other chronic diseases of tonsils and adenoids: J35.8

## 2020-11-18 ENCOUNTER — Other Ambulatory Visit (HOSPITAL_COMMUNITY): Payer: Managed Care, Other (non HMO)

## 2020-11-19 ENCOUNTER — Other Ambulatory Visit (HOSPITAL_COMMUNITY)
Admission: RE | Admit: 2020-11-19 | Discharge: 2020-11-19 | Disposition: A | Discharge status: Home / Self Care | Payer: Managed Care, Other (non HMO) | Source: Ambulatory Visit | Attending: Internal Medicine | Admitting: Internal Medicine

## 2020-11-19 DIAGNOSIS — Z01812 Encounter for preprocedural laboratory examination: Secondary | ICD-10-CM | POA: Insufficient documentation

## 2020-11-19 DIAGNOSIS — Z20822 Contact with and (suspected) exposure to covid-19: Secondary | ICD-10-CM | POA: Diagnosis not present

## 2020-11-19 LAB — SARS CORONAVIRUS 2 (TAT 6-24 HRS): SARS Coronavirus 2: NEGATIVE

## 2020-11-22 ENCOUNTER — Encounter (HOSPITAL_BASED_OUTPATIENT_CLINIC_OR_DEPARTMENT_OTHER): Payer: Self-pay | Admitting: Otolaryngology

## 2020-11-22 ENCOUNTER — Ambulatory Visit (HOSPITAL_BASED_OUTPATIENT_CLINIC_OR_DEPARTMENT_OTHER): Payer: Managed Care, Other (non HMO) | Admitting: Certified Registered"

## 2020-11-22 ENCOUNTER — Ambulatory Visit (HOSPITAL_BASED_OUTPATIENT_CLINIC_OR_DEPARTMENT_OTHER)
Admission: RE | Admit: 2020-11-22 | Discharge: 2020-11-22 | Disposition: A | Discharge status: Home / Self Care | Payer: Managed Care, Other (non HMO) | Source: Ambulatory Visit | Attending: Otolaryngology | Admitting: Otolaryngology

## 2020-11-22 ENCOUNTER — Encounter (HOSPITAL_BASED_OUTPATIENT_CLINIC_OR_DEPARTMENT_OTHER)
Admission: RE | Disposition: A | Discharge status: Home / Self Care | Payer: Self-pay | Source: Ambulatory Visit | Attending: Otolaryngology

## 2020-11-22 DIAGNOSIS — J359 Chronic disease of tonsils and adenoids, unspecified: Secondary | ICD-10-CM | POA: Insufficient documentation

## 2020-11-22 DIAGNOSIS — C090 Malignant neoplasm of tonsillar fossa: Secondary | ICD-10-CM | POA: Insufficient documentation

## 2020-11-22 DIAGNOSIS — C01 Malignant neoplasm of base of tongue: Secondary | ICD-10-CM | POA: Diagnosis not present

## 2020-11-22 HISTORY — PX: DIRECT LARYNGOSCOPY: SHX5326

## 2020-11-22 HISTORY — DX: Sleep apnea, unspecified: G47.30

## 2020-11-22 HISTORY — PX: TONSILLECTOMY: SHX5217

## 2020-11-22 SURGERY — TONSILLECTOMY
Anesthesia: General | Site: Throat

## 2020-11-22 MED ORDER — HYDROMORPHONE HCL 1 MG/ML IJ SOLN
INTRAMUSCULAR | Status: AC
  Filled 2020-11-22: qty 0.5

## 2020-11-22 MED ORDER — PROPOFOL 10 MG/ML IV BOLUS
INTRAVENOUS | Status: DC | PRN
  Administered 2020-11-22: 200 mg via INTRAVENOUS

## 2020-11-22 MED ORDER — ONDANSETRON HCL 4 MG/2ML IJ SOLN
INTRAMUSCULAR | Status: DC | PRN
  Administered 2020-11-22: 4 mg via INTRAVENOUS

## 2020-11-22 MED ORDER — HYDROMORPHONE HCL 1 MG/ML IJ SOLN
0.2500 mg | INTRAMUSCULAR | Status: DC | PRN
Start: 2020-11-22 — End: 2020-11-22
  Administered 2020-11-22 (×3): 0.5 mg via INTRAVENOUS

## 2020-11-22 MED ORDER — LIDOCAINE-EPINEPHRINE 1 %-1:100000 IJ SOLN
INTRAMUSCULAR | Status: AC
  Filled 2020-11-22: qty 1

## 2020-11-22 MED ORDER — SUGAMMADEX SODIUM 500 MG/5ML IV SOLN
INTRAVENOUS | Status: DC | PRN
  Administered 2020-11-22: 300 mg via INTRAVENOUS

## 2020-11-22 MED ORDER — MIDAZOLAM HCL 2 MG/2ML IJ SOLN
INTRAMUSCULAR | Status: AC
  Filled 2020-11-22: qty 2

## 2020-11-22 MED ORDER — ROCURONIUM BROMIDE 10 MG/ML (PF) SYRINGE
PREFILLED_SYRINGE | INTRAVENOUS | Status: AC
  Filled 2020-11-22: qty 10

## 2020-11-22 MED ORDER — AMISULPRIDE (ANTIEMETIC) 5 MG/2ML IV SOLN: 10.0000 mg | Freq: Once | INTRAVENOUS | Status: DC | PRN

## 2020-11-22 MED ORDER — MUPIROCIN 2 % EX OINT
TOPICAL_OINTMENT | CUTANEOUS | Status: AC
  Filled 2020-11-22: qty 22

## 2020-11-22 MED ORDER — SODIUM CHLORIDE 0.9 % IR SOLN
Status: DC | PRN
  Administered 2020-11-22: 300 mL

## 2020-11-22 MED ORDER — DEXAMETHASONE SODIUM PHOSPHATE 4 MG/ML IJ SOLN
INTRAMUSCULAR | Status: DC | PRN
  Administered 2020-11-22: 10 mg via INTRAVENOUS

## 2020-11-22 MED ORDER — BACITRACIN ZINC 500 UNIT/GM EX OINT
TOPICAL_OINTMENT | CUTANEOUS | Status: AC
  Filled 2020-11-22: qty 0.9

## 2020-11-22 MED ORDER — OXYMETAZOLINE HCL 0.05 % NA SOLN
NASAL | Status: AC
  Filled 2020-11-22: qty 90

## 2020-11-22 MED ORDER — FENTANYL CITRATE (PF) 100 MCG/2ML IJ SOLN
INTRAMUSCULAR | Status: DC | PRN
  Administered 2020-11-22: 100 ug via INTRAVENOUS

## 2020-11-22 MED ORDER — FENTANYL CITRATE (PF) 100 MCG/2ML IJ SOLN
INTRAMUSCULAR | Status: AC
  Filled 2020-11-22: qty 2

## 2020-11-22 MED ORDER — BUPIVACAINE HCL (PF) 0.25 % IJ SOLN
INTRAMUSCULAR | Status: AC
  Filled 2020-11-22: qty 30

## 2020-11-22 MED ORDER — ONDANSETRON HCL 4 MG/2ML IJ SOLN
INTRAMUSCULAR | Status: AC
  Filled 2020-11-22: qty 2

## 2020-11-22 MED ORDER — AMOXICILLIN 400 MG/5ML PO SUSR: 800.0000 mg | Freq: Two times a day (BID) | ORAL | 0 refills | Status: AC

## 2020-11-22 MED ORDER — OXYCODONE HCL 5 MG PO TABS: 5.0000 mg | ORAL_TABLET | Freq: Once | ORAL | Status: DC | PRN

## 2020-11-22 MED ORDER — OXYCODONE HCL 5 MG/5ML PO SOLN: 5.0000 mg | Freq: Once | ORAL | Status: DC | PRN

## 2020-11-22 MED ORDER — LIDOCAINE 2% (20 MG/ML) 5 ML SYRINGE
INTRAMUSCULAR | Status: AC
  Filled 2020-11-22: qty 5

## 2020-11-22 MED ORDER — ROCURONIUM BROMIDE 100 MG/10ML IV SOLN
INTRAVENOUS | Status: DC | PRN
  Administered 2020-11-22: 100 mg via INTRAVENOUS

## 2020-11-22 MED ORDER — PROMETHAZINE HCL 25 MG/ML IJ SOLN: 6.2500 mg | INTRAMUSCULAR | Status: DC | PRN

## 2020-11-22 MED ORDER — LACTATED RINGERS IV SOLN: INTRAVENOUS | Status: DC

## 2020-11-22 MED ORDER — OXYMETAZOLINE HCL 0.05 % NA SOLN
NASAL | Status: DC | PRN
  Administered 2020-11-22: 1 via TOPICAL

## 2020-11-22 MED ORDER — MIDAZOLAM HCL 5 MG/5ML IJ SOLN
INTRAMUSCULAR | Status: DC | PRN
  Administered 2020-11-22: 2 mg via INTRAVENOUS

## 2020-11-22 MED ORDER — DEXAMETHASONE SODIUM PHOSPHATE 10 MG/ML IJ SOLN
INTRAMUSCULAR | Status: AC
  Filled 2020-11-22: qty 1

## 2020-11-22 MED ORDER — MEPERIDINE HCL 25 MG/ML IJ SOLN: 6.2500 mg | INTRAMUSCULAR | Status: DC | PRN

## 2020-11-22 MED ORDER — LIDOCAINE HCL (CARDIAC) PF 100 MG/5ML IV SOSY
PREFILLED_SYRINGE | INTRAVENOUS | Status: DC | PRN
  Administered 2020-11-22: 100 mg via INTRAVENOUS

## 2020-11-22 MED ORDER — OXYCODONE-ACETAMINOPHEN 7.5-325 MG PO TABS: 2.0000 | ORAL_TABLET | Freq: Four times a day (QID) | ORAL | 0 refills | Status: AC | PRN

## 2020-11-22 SURGICAL SUPPLY — 31 items
BNDG COHESIVE 2X5 TAN STRL LF (GAUZE/BANDAGES/DRESSINGS) IMPLANT
CANISTER SUCT 1200ML W/VALVE (MISCELLANEOUS) ×3 IMPLANT
CATH ROBINSON RED A/P 10FR (CATHETERS) IMPLANT
CATH ROBINSON RED A/P 14FR (CATHETERS) IMPLANT
COAGULATOR SUCT SWTCH 10FR 6 (ELECTROSURGICAL) IMPLANT
COVER BACK TABLE 60X90IN (DRAPES) ×3 IMPLANT
COVER MAYO STAND STRL (DRAPES) ×3 IMPLANT
COVER WAND RF STERILE (DRAPES) IMPLANT
ELECT REM PT RETURN 9FT ADLT (ELECTROSURGICAL) ×3
ELECT REM PT RETURN 9FT PED (ELECTROSURGICAL)
ELECTRODE REM PT RETRN 9FT PED (ELECTROSURGICAL) IMPLANT
ELECTRODE REM PT RTRN 9FT ADLT (ELECTROSURGICAL) IMPLANT
GAUZE SPONGE 4X4 12PLY STRL LF (GAUZE/BANDAGES/DRESSINGS) ×3 IMPLANT
GLOVE SURG ENC MOIS LTX SZ7.5 (GLOVE) ×3 IMPLANT
GLOVE SURG POLYISO LF SZ8 (GLOVE) ×1 IMPLANT
GOWN STRL REUS W/ TWL LRG LVL3 (GOWN DISPOSABLE) ×4 IMPLANT
GOWN STRL REUS W/TWL 2XL LVL3 (GOWN DISPOSABLE) ×1 IMPLANT
GOWN STRL REUS W/TWL LRG LVL3 (GOWN DISPOSABLE) ×3
IV NS 500ML (IV SOLUTION) ×3
IV NS 500ML BAXH (IV SOLUTION) ×2 IMPLANT
MARKER SKIN DUAL TIP RULER LAB (MISCELLANEOUS) IMPLANT
NS IRRIG 1000ML POUR BTL (IV SOLUTION) ×1 IMPLANT
SHEET MEDIUM DRAPE 40X70 STRL (DRAPES) ×3 IMPLANT
SOLUTION BUTLER CLEAR DIP (MISCELLANEOUS) ×3 IMPLANT
SPONGE TONSIL TAPE 1.25 RFD (DISPOSABLE) ×1 IMPLANT
SYR BULB EAR ULCER 3OZ GRN STR (SYRINGE) ×1 IMPLANT
TOWEL GREEN STERILE FF (TOWEL DISPOSABLE) ×3 IMPLANT
TUBE CONNECTING 20X1/4 (TUBING) ×3 IMPLANT
TUBE SALEM SUMP 12R W/ARV (TUBING) IMPLANT
TUBE SALEM SUMP 16 FR W/ARV (TUBING) ×1 IMPLANT
WAND COBLATOR 70 EVAC XTRA (SURGICAL WAND) ×1 IMPLANT

## 2020-11-22 NOTE — Anesthesia Postprocedure Evaluation (Signed)
Anesthesia Post Note  Patient: Gerald [name]  Procedure(s) Performed: LEFT TONSILLECTOMY (Left Throat) DIRECT LARYNGOSCOPY WITH BIOPSY (N/A Throat)     Patient location during evaluation: PACU Anesthesia Type: General Level of consciousness: awake and alert Pain management: pain level controlled Vital Signs Assessment: post-procedure vital signs reviewed and stable Respiratory status: spontaneous breathing, nonlabored ventilation and respiratory function stable Cardiovascular status: blood pressure returned to baseline and stable Postop Assessment: no apparent nausea or vomiting Anesthetic complications: no   No complications documented.  Last Vitals:  Vitals:   11/22/20 0915 11/22/20 0951  BP: 133/83 127/72  Pulse: 70 72  Resp: 15 20  Temp:  36.5 C  SpO2: 98% (!) 72%    Last Pain:  Vitals:   11/22/20 0951  TempSrc: Oral  PainSc: 2                  Lynda Rainwater

## 2020-11-22 NOTE — Anesthesia Preprocedure Evaluation (Signed)
Anesthesia Evaluation  Patient identified by MRN, date of birth, ID band Patient awake    Reviewed: Allergy & Precautions, NPO status , Patient's Chart, lab work & pertinent test results  Airway Mallampati: II  TM Distance: >3 FB Neck ROM: Full    Dental no notable dental hx.    Pulmonary sleep apnea ,    Pulmonary exam normal breath sounds clear to auscultation       Cardiovascular negative cardio ROS Normal cardiovascular exam Rhythm:Regular Rate:Normal     Neuro/Psych negative neurological ROS  negative psych ROS   GI/Hepatic negative GI ROS, Neg liver ROS,   Endo/Other  negative endocrine ROS  Renal/GU negative Renal ROS  negative genitourinary   Musculoskeletal negative musculoskeletal ROS (+)   Abdominal (+) + obese,   Peds negative pediatric ROS (+)  Hematology negative hematology ROS (+)   Anesthesia Other Findings Tonsillar Mass  Reproductive/Obstetrics negative OB ROS                             Anesthesia Physical Anesthesia Plan  ASA: II  Anesthesia Plan: General   Post-op Pain Management:    Induction: Intravenous  PONV Risk Score and Plan: 2 and Ondansetron, Midazolam and Treatment may vary due to age or medical condition  Airway Management Planned: Oral ETT  Additional Equipment:   Intra-op Plan:   Post-operative Plan: Extubation in OR  Informed Consent: I have reviewed the patients History and Physical, chart, labs and discussed the procedure including the risks, benefits and alternatives for the proposed anesthesia with the patient or authorized representative who has indicated his/her understanding and acceptance.     Dental advisory given  Plan Discussed with: CRNA  Anesthesia Plan Comments:         Anesthesia Quick Evaluation

## 2020-11-22 NOTE — Discharge Instructions (Signed)
Post Anesthesia Home Care Instructions  Activity: Get plenty of rest for the remainder of the day. A responsible individual must stay with you for 24 hours following the procedure.  For the next 24 hours, DO NOT: -Drive a car -Paediatric nurse -Drink alcoholic beverages -Take any medication unless instructed by your physician -Make any legal decisions or sign important papers.  Meals: Start with liquid foods such as gelatin or soup. Progress to regular foods as tolerated. Avoid greasy, spicy, heavy foods. If nausea and/or vomiting occur, drink only clear liquids until the nausea and/or vomiting subsides. Call your physician if vomiting continues.  Special Instructions/Symptoms: Your throat may feel dry or sore from the anesthesia or the breathing tube placed in your throat during surgery. If this causes discomfort, gargle with warm salt water. The discomfort should disappear within 24 hours.  If you had a scopolamine patch placed behind your ear for the management of post- operative nausea and/or vomiting:  1. The medication in the patch is effective for 72 hours, after which it should be removed.  Wrap patch in a tissue and discard in the trash. Wash hands thoroughly with soap and water. 2. You may remove the patch earlier than 72 hours if you experience unpleasant side effects which may include dry mouth, dizziness or visual disturbances. 3. Avoid touching the patch. Wash your hands with soap and water after contact with the patch.    -------------  Erlean Mealor Raynelle Bring M.D., P.A. Postoperative Instructions for Tonsillectomy  Activity Restrict activity at home for the first two days, resting as much as possible. Light indoor activity is best. You may usually return to school or work within a week but void strenuous activity and sports for two weeks. Sleep with your head elevated on 2-3 pillows for 3-4 days to help decrease swelling. Diet Due to tissue swelling and throat discomfort, you  may have little desire to drink for several days. However fluids are very important to prevent dehydration. You will find that non-acidic juices, soups, popsicles, Jell-O, custard, puddings, and any soft or mashed foods taken in small quantities can be swallowed fairly easily. Try to increase your fluid and food intake as the discomfort subsides. It is recommended that a child receive 1-1/2 quarts of fluid in a 24-hour period. Adult require twice this amount.  Discomfort Your sore throat may be relieved by applying an ice collar to your neck and/or by taking Tylenol. You may experience an earache, which is due to referred pain from the throat. Referred ear pain is commonly felt at night when trying to rest.  Bleeding                        Although rare, there is risk of having some bleeding during the first 2 weeks after having a T&A. This usually happens between days 7-10 postoperatively. If you or your child should have any bleeding, try to remain calm. We recommend sitting up quietly in a chair and gently spitting out the blood into a bowl. For adults, gargling gently with ice water may help. If the bleeding does not stop after a short time (5 minutes), is more than 1 teaspoonful, or if you become worried, please call our office at [phone] or go directly to the nearest hospital emergency room. Do not eat or drink anything prior to going to the hospital as you may need to be taken to the operating room in order to control the bleeding. GENERAL  CONSIDERATIONS 1. Brush your teeth regularly. Avoid mouthwashes and gargles for three weeks. You may gargle gently with warm salt-water as necessary or spray with Chloraseptic. You may make salt-water by placing 2 teaspoons of table salt into a quart of fresh water. Warm the salt-water in a microwave to a luke warm temperature.  2. Avoid exposure to colds and upper respiratory infections if possible.  3. If you look into a mirror or into your child's mouth,  you will see white-gray patches in the back of the throat. This is normal after having a T&A and is like a scab that forms on the skin after an abrasion. It will disappear once the back of the throat heals completely. However, it may cause a noticeable odor; this too will disappear with time. Again, warm salt-water gargles may be used to help keep the throat clean and promote healing.  4. You may notice a temporary change in voice quality, such as a higher pitched voice or a nasal sound, until healing is complete. This may last for 1-2 weeks and should resolve.  5. Do not take or give you child any medications that we have not prescribed or recommended.  6. Snoring may occur, especially at night, for the first week after a T&A. It is due to swelling of the soft palate and will usually resolve.  Please call our office at [phone] if you have any questions.

## 2020-11-22 NOTE — Op Note (Signed)
DATE OF PROCEDURE:  11/22/2020                              OPERATIVE REPORT  SURGEON:  Leta Baptist, MD  PREOPERATIVE DIAGNOSES: 1. Left tonsillar mass  POSTOPERATIVE DIAGNOSES: 1. Left tonsillar and tongue bass mass  PROCEDURE PERFORMED:   1. Left tonsillectomy 2.  Direct laryngoscopy and tongue base biopsy  ANESTHESIA:  General endotracheal tube anesthesia.  COMPLICATIONS:  None.  ESTIMATED BLOOD LOSS: 25 mL.  INDICATION FOR PROCEDURE:  Gerald [name] is a 50 y.o. male with a history of chronic sore throat.  He was treated with multiple antibiotics without improvement in his symptoms.  He subsequently underwent a neck CT scan.  The CT showed primarily submucosal 3 cm mass within the left tonsillar fossa.  He was also noted to have a 3.2 cm left jugulodigastric lymph node.  The findings were concerning for left tonsillar malignancy and malignant lymph node.  Based on the above findings, the decision was made for the patient to undergo the left tonsillectomy procedure. The risks, benefits, alternatives, and details of the procedure were discussed with the patient. Questions were invited and answered.  Informed consent was obtained. Intraoperatively, the palpable mass appeared to extend into the left tongue base.  As a result, direct laryngoscopy was performed, with biopsy of the tongue base tissue.    DESCRIPTION:  The patient was taken to the operating room and placed supine on the operating table.  General endotracheal tube anesthesia was administered by the anesthesiologist.  The patient was positioned and prepped and draped in a standard fashion for adenotonsillectomy.  A Crowe-Davis mouth gag was inserted into the oral cavity for exposure. 2+ tonsils were noted bilaterally.  Palpation of the tonsillar fossa revealed a firm mass within the inferior portion of the left tonsillar fossa, extending into the left tongue base.  The left tonsil was then grasped with a straight Allis clamp and retracted  medially.  It was resected free from the underlying pharyngeal constrictor muscles with the Coblator device.  After the removal of the left tonsil, palpable mass was still noted within the left tongue base.    A Dedo laryngoscope was inserted via the oral cavity into the pharynx.  Examination of the vallecula showed no obvious lesion or mass.  The piriform sinus, aryepiglottic folds, and the laryngeal inlet were all normal.  Multiple biopsy specimens were obtained from the left tongue base.  The surgical sites were copiously irrigated.  The Dedo laryngoscope was withdrawn.   The care of the patient was turned over to the anesthesiologist.  The patient was awakened from anesthesia without difficulty.  The patient was extubated and transferred to the recovery room in good condition.  OPERATIVE FINDINGS: Left tonsillar mass, extending into the left tongue base.  SPECIMEN: Left tonsil.  Multiple biopsy specimens from the left tongue base.  FOLLOWUP CARE:  The patient will be discharged home once awake and alert.  He will be placed on amoxicillin 800 mg p.o. b.i.d. for 5 days, and percocet for postop pain control.   The patient will follow up in my office in approximately 1 week.  Melanye Hiraldo W Gennifer Potenza 11/22/2020 8:28 AM

## 2020-11-22 NOTE — H&P (Signed)
Cc: Left tonsillar mass  HPI: The patient is a 50 year old male who returns today for his follow-up evaluation. The patient has been experiencing chronic sore throat for the past few months.  At his last visit, he was noted to have 2+ cryptic tonsils bilaterally.  Persistent fullness was noted along the left submandibular gland.  He subsequently underwent a neck CT scan.  The CT showed primarily submucosal 3 cm mass within the left tonsillar fossa.  He was also noted to have a 3.2 cm left jugulodigastric lymph node.  The findings were concerning for left tonsillar malignancy and malignant lymph node.  The patient returned today complaining of persistent left-sided sore throat and referred pain to his left ear.  He is experiencing occasional dysphagia and odynophagia.  He denies any dyspnea or change in his voice.  He has no significant history of tobacco use.    Exam: General: Communicates without difficulty, well nourished, no acute distress. Head:  Normocephalic, no lesions or asymmetry. Eyes: PERRL, EOMI. No scleral icterus, conjunctivae clear. Neuro: CN II exam reveals vision grossly intact. No nystagmus at any point of gaze. Ears:  EAC normal without erythema AU. TM intact without fluid and mobile AU. Nose: Moist, pink mucosa without lesions or mass. Mouth: Oral cavity clear and moist, no lesions, tonsils symmetric. Tonsils are 2+. Tonsils without obvious ulceration. Neck: Full range of motion, no lymphadenopathy or masses. Tenderness and fullness noted along the left submandibular space. Neuro:  CN 2-12 grossly intact. Gait normal. Vestibular: No nystagmus at any point of gaze. The cerebellar examination is unremarkable.   Assessment: 1.  The patient is noted to have 2+ symmetric tonsils bilaterally.  However, his recent CT scan showed a 3 cm submucosal mass within the left tonsillar fossa.   2.  The patient also has a 3 cm left jugulodigastric lymph node.  3.  No ulcerative mass or lesion is noted  on today's laryngoscopy examination.   Plan 1.  The laryngoscopy findings and the CT images are reviewed with the patient.  2.  Based on the above findings, the patient will benefit from undergoing left tonsillectomy to obtain tissue diagnosis of the tonsillar mass.  The risks, benefits, alternatives and details of the procedure are reviewed with the patient.  Questions are invited and answered.  3.  The patient would like to proceed with the procedure.

## 2020-11-22 NOTE — Anesthesia Procedure Notes (Signed)
Procedure Name: Intubation Performed by: Tawni Millers, CRNA Pre-anesthesia Checklist: Patient identified, Emergency Drugs available, Suction available and Patient being monitored Patient Re-evaluated:Patient Re-evaluated prior to induction Oxygen Delivery Method: Circle system utilized Preoxygenation: Pre-oxygenation with 100% oxygen Induction Type: IV induction Ventilation: Mask ventilation without difficulty Grade View: Grade II Tube type: Oral Rae Tube size: 7.0 mm Number of attempts: 1 Airway Equipment and Method: Stylet and Oral airway Placement Confirmation: ETT inserted through vocal cords under direct vision,  positive ETCO2 and breath sounds checked- equal and bilateral Tube secured with: Tape Dental Injury: Teeth and Oropharynx as per pre-operative assessment

## 2020-11-22 NOTE — Transfer of Care (Signed)
Immediate Anesthesia Transfer of Care Note  Patient: Gerald [name]  Procedure(s) Performed: LEFT TONSILLECTOMY (Left Throat) DIRECT LARYNGOSCOPY WITH BIOPSY (N/A Throat)  Patient Location: PACU  Anesthesia Type:General  Level of Consciousness: awake  Airway & Oxygen Therapy: Patient Spontanous Breathing and Patient connected to face mask oxygen  Post-op Assessment: Report given to RN and Post -op Vital signs reviewed and stable  Post vital signs: Reviewed and stable  Last Vitals:  Vitals Value Taken Time  BP    Temp    Pulse 95 11/22/20 0835  Resp 12 11/22/20 0835  SpO2 100 % 11/22/20 0835  Vitals shown include unvalidated device data.  Last Pain:  Vitals:   11/22/20 0637  TempSrc: Oral  PainSc: 8       Patients Stated Pain Goal: 3 (53/66/44 0347)  Complications: No complications documented.

## 2020-11-23 ENCOUNTER — Encounter (HOSPITAL_BASED_OUTPATIENT_CLINIC_OR_DEPARTMENT_OTHER): Payer: Self-pay | Admitting: Otolaryngology

## 2020-11-24 ENCOUNTER — Other Ambulatory Visit: Payer: Self-pay | Admitting: Otolaryngology

## 2020-11-24 ENCOUNTER — Other Ambulatory Visit (HOSPITAL_COMMUNITY): Payer: Self-pay | Admitting: Otolaryngology

## 2020-11-24 DIAGNOSIS — C091 Malignant neoplasm of tonsillar pillar (anterior) (posterior): Secondary | ICD-10-CM

## 2020-11-24 LAB — SURGICAL PATHOLOGY

## 2020-11-26 ENCOUNTER — Telehealth: Payer: Self-pay | Admitting: Hematology and Oncology

## 2020-11-26 NOTE — Telephone Encounter (Signed)
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 Gerald [name] has been cld and scheduled to see Dr. Chryl Heck on 5/16 at 10am. Pt aware to arrive 20 minutes early.

## 2020-11-29 ENCOUNTER — Other Ambulatory Visit: Payer: Self-pay

## 2020-11-29 DIAGNOSIS — C099 Malignant neoplasm of tonsil, unspecified: Secondary | ICD-10-CM

## 2020-12-03 ENCOUNTER — Other Ambulatory Visit: Payer: Self-pay

## 2020-12-03 ENCOUNTER — Ambulatory Visit (HOSPITAL_COMMUNITY)
Admission: RE | Admit: 2020-12-03 | Discharge: 2020-12-03 | Disposition: A | Discharge status: Home / Self Care | Payer: Managed Care, Other (non HMO) | Source: Ambulatory Visit | Attending: Otolaryngology | Admitting: Otolaryngology

## 2020-12-03 ENCOUNTER — Other Ambulatory Visit (HOSPITAL_COMMUNITY): Payer: Managed Care, Other (non HMO)

## 2020-12-03 DIAGNOSIS — C091 Malignant neoplasm of tonsillar pillar (anterior) (posterior): Secondary | ICD-10-CM | POA: Diagnosis present

## 2020-12-03 LAB — GLUCOSE, CAPILLARY: Glucose-Capillary: 90 mg/dL (ref 70–99)

## 2020-12-03 MED ORDER — FLUDEOXYGLUCOSE F - 18 (FDG) INJECTION
13.9000 | Freq: Once | INTRAVENOUS | Status: AC | PRN
  Administered 2020-12-03: 13.9 via INTRAVENOUS

## 2020-12-06 ENCOUNTER — Other Ambulatory Visit: Payer: Self-pay

## 2020-12-06 ENCOUNTER — Inpatient Hospital Stay: Payer: Managed Care, Other (non HMO)

## 2020-12-06 ENCOUNTER — Inpatient Hospital Stay: Payer: Managed Care, Other (non HMO) | Attending: Hematology and Oncology | Admitting: Hematology and Oncology

## 2020-12-06 ENCOUNTER — Encounter: Payer: Self-pay | Admitting: Radiation Oncology

## 2020-12-06 ENCOUNTER — Encounter: Payer: Self-pay | Admitting: Hematology and Oncology

## 2020-12-06 DIAGNOSIS — E669 Obesity, unspecified: Secondary | ICD-10-CM | POA: Diagnosis not present

## 2020-12-06 DIAGNOSIS — C109 Malignant neoplasm of oropharynx, unspecified: Secondary | ICD-10-CM | POA: Diagnosis not present

## 2020-12-06 DIAGNOSIS — G893 Neoplasm related pain (acute) (chronic): Secondary | ICD-10-CM | POA: Diagnosis not present

## 2020-12-06 DIAGNOSIS — G4730 Sleep apnea, unspecified: Secondary | ICD-10-CM | POA: Diagnosis not present

## 2020-12-06 NOTE — Assessment & Plan Note (Signed)
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  At this time his clinical staging is at least a T2N1M0 or stage I HPV positive oropharyngeal cancer. We have reviewed PET/CT imaging and discussed that there is no clear evidence of distant metastatic disease.  There is definitely ipsilateral lymphadenopathy.  We have discussed that possible options at this point include surgery versus CRT. We have discussed about weekly versus every 21 days cisplatin dosing, mechanism of action, adverse effects including but not limited to fatigue, nausea, vomiting, ototoxicity, nephrotoxicity, increased risk of infections etc.  He understands that some of the side effects can be permanent.  He is willing to take the every 21-day dosing, he wants to do the most aggressive treatment. We have discussed that every 21-day dosing of cisplatin has not been head-to-head compared to every weekly dosing but has been used in several studies as an alternative given the toxicity from every 21 days cisplatin.  We discussed that there is an ongoing trial comparing both of these regimens. He also has some appointments at [facility] for second opinion.  He would like to talk to the medical oncologist there and would like to make a final decision.  He will have to reach out back to [place] before we place any orders for port or G-tube since he is not sure where he would like to do his treatment. He will also be meeting Dr. Isidore Moos from radiation oncology tomorrow.  We have also discussed about port as well as a G-tube for chemotherapy administration and nutrition support respectively. All his questions were answered to the best of my knowledge.  Thank you for consulting [place] in the care of this patient.  Please do  not hesitate to contact us with any new additional questions or concerns.

## 2020-12-06 NOTE — Progress Notes (Signed)
[place] NOTE  Patient Care Team: Pa, [place] as PCP - General (Family Medicine)  CHIEF COMPLAINTS/PURPOSE OF CONSULTATION:  HPV positive SCC oropharynx.  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  At this time his clinical staging is at least a T2N1M0 or stage I HPV positive oropharyngeal cancer. We have reviewed PET/CT imaging and discussed that there is no clear evidence of distant metastatic disease.  There is definitely ipsilateral lymphadenopathy.  We have discussed that possible options at this point include surgery versus CRT. We have discussed about weekly versus every 21 days cisplatin dosing, mechanism of action, adverse effects including but not limited to fatigue, nausea, vomiting, ototoxicity, nephrotoxicity, increased risk of infections etc.  He understands that some of the side effects can be permanent.  He is willing to take the every 21-day dosing, he wants to do the most aggressive treatment. We have discussed that every 21-day dosing of cisplatin has not been head-to-head compared to every weekly dosing but has been used in several studies as an alternative given the toxicity from every 21 days cisplatin.  We discussed that there is an ongoing trial comparing both of these regimens. He also has some appointments at [facility] for second opinion.  He would like to talk to the medical oncologist there and would like to make a final decision.  He will have to reach out back to [place] before we place any orders for port or G-tube since he is not sure where he would like to do his treatment. He will also be meeting Dr. Isidore Moos from  radiation oncology tomorrow.  We have also discussed about port as well as a G-tube for chemotherapy administration and nutrition support respectively. All his questions were answered to the best of my knowledge.  Thank you for consulting [place] in the care of this patient.  Please do not hesitate to contact us with any new additional questions or concerns.  Neoplasm related pain He has had severe pain since the left tonsillectomy and has been using two 7.5 mg doses of Percocets every 3-4 hours.  He has lost about 20 pounds in the past week because of lack of oral intake given the severe pain.  He will continue the current pain management.  Orders Placed This Encounter  Procedures  . Ambulatory referral to Social Work    Referral Priority:   Routine    Referral Type:   Consultation    Referral Reason:   Specialty Services Required    Number of Visits Requested:   1     HISTORY OF PRESENTING ILLNESS:  Gerald [name] 50 y.o. male is here because of left tonsillar cancer, HPV positive.  Oncology History Overview Note  50 yr old male who has experiencing chronic sore throat for the past few months. He underwent a CT neck which showed primarily a submucosal 3 cm mass within the left tonsillar fossa also noted to have 3.2 cm left jugulodigastric lymph node, findings were concerning for left tonsillar malignancy and malignant lymph node. He underwent left tonsillectomy by Dr. [name] and pathology showed invasive squamous cell carcinoma, HPV mediated.  He He had PET imaging which showed evidence of left tonsillar cancer and left ipsilateral  lymph nodes, from my review, no evidence of distant metastasis, I could not see an official report on the PET imaging. He is here for initial recommendations.   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[place])  12/06/2020 Initial Diagnosis   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[place])   12/06/2020 Cancer Staging   Staging form: Pharynx - HPV-Mediated Oropharynx, AJCC 8th  Edition - Clinical stage from 12/06/2020: Stage I (cT2, cN1, cM0, p16+) - Signed by Gerald Pike, MD on 12/06/2020 Stage prefix: Initial diagnosis    Today, patient arrived to the appointment with his wife.  He is a Building services engineer.  He tells me that there is a lot of sore throat since his tonsillectomy and the he was not able to eat or drink for almost a week which led to a 20 pound weight loss. He otherwise is very healthy at baseline except for obesity and underlying sleep apnea. He has been taking 2 percocet every 4 hrs for management of pain. Besides the sore throat and weight loss, he feels well. Rest of the pertinent 10 point ROS reviewed and negative.  REVIEW OF SYSTEMS:   Constitutional: Denies fevers, chills or abnormal night sweats Eyes: Denies blurriness of vision, double vision or watery eyes Ears, nose, mouth, throat, and face: As mentioned in HPIRespiratory: Denies cough, dyspnea or wheezes Cardiovascular: Denies palpitation, chest discomfort or lower extremity swelling Gastrointestinal:  Denies nausea, heartburn or change in bowel habits Skin: Denies abnormal skin rashes Lymphatics: Denies new lymphadenopathy or easy bruising Neurological:Denies numbness, tingling or new weaknesses Behavioral/Psych: Mood is stable, no new changes  All other systems were reviewed with the patient and are negative.  MEDICAL HISTORY:  Past Medical History:  Diagnosis Date  . Sleep apnea   . Tonsillar mass 11/16/2020    SURGICAL HISTORY: Past Surgical History:  Procedure Laterality Date  . DIRECT LARYNGOSCOPY N/A 11/22/2020   Procedure: DIRECT LARYNGOSCOPY WITH BIOPSY;  Surgeon: Leta Baptist, MD;  Location: [name];  Service: ENT;  Laterality: N/A;  . FOOT SURGERY  2000   extra nivicular removal  . SPINE SURGERY    . TONSILLECTOMY Left 11/22/2020   Procedure: LEFT TONSILLECTOMY;  Surgeon: Leta Baptist, MD;  Location: [facility];  Service: ENT;  Laterality:  Left;    SOCIAL HISTORY: Social History   Socioeconomic History  . Marital status: Married    Spouse name: Not on file  . Number of children: Not on file  . Years of education: Not on file  . Highest education level: Not on file  Occupational History  . Not on file  Tobacco Use  . Smoking status: Never Smoker  . Smokeless tobacco: Never Used  Vaping Use  . Vaping Use: Never used  Substance and Sexual Activity  . Alcohol use: Yes    Comment: rare  . Drug use: Never  . Sexual activity: Not on file  Other Topics Concern  . Not on file  Social History Narrative  . Not on file   Social Determinants of Health   Financial Resource Strain: Not on file  Food Insecurity: Not on file  Transportation Needs: Not on file  Physical Activity: Not on file  Stress: Not on file  Social Connections: Not on file  Intimate Partner Violence: Not on file    FAMILY HISTORY: No family history on file.  ALLERGIES:  has No Known Allergies.  MEDICATIONS:  Current Outpatient Medications  Medication Sig Dispense Refill  . oxyCODONE-acetaminophen (PERCOCET) 7.5-325 MG  tablet Take by mouth.     No current facility-administered medications for this visit.    PHYSICAL EXAMINATION: ECOG PERFORMANCE STATUS: 1 - Symptomatic but completely ambulatory  Vitals:   12/06/20 0956  BP: 119/86  Pulse: 64  Resp: 17  Temp: (!) 97.5 F (36.4 C)  SpO2: 100%   Filed Weights   12/06/20 0956  Weight: 255 lb 14.4 oz (116.1 kg)    GENERAL:alert, no distress and comfortable, obese. EYES: normal, conjunctiva are pink and non-injected, sclera clear OROPHARYNX:no exudate, no erythema and lips, buccal mucosa. Firmness at the BOT on the left, tonsilar fossa empty, healing from recent tonsillectomy. NECK: supple, thyroid normal size, non-tender, without nodularity, palpable LN left neck measuring over 3 cms on my exam. LYMPH:  no palpable lymphadenopathy in the right cervical or axillary. LUNGS: clear  to auscultation and percussion with normal breathing effort HEART: regular rate & rhythm and no murmurs and no lower extremity edema ABDOMEN:abdomen soft, non-tender and normal bowel sounds Musculoskeletal:no cyanosis of digits and no clubbing  PSYCH: alert & oriented x 3 with fluent speech NEURO: no focal motor/sensory deficits  LABORATORY DATA:  I have reviewed the data as listed Lab Results  Component Value Date   WBC 10.1 11/11/2020   HGB 15.5 11/11/2020   HCT 44.2 11/11/2020   MCV 95.9 11/11/2020   PLT 251 11/11/2020     Chemistry      Component Value Date/Time   NA 137 11/11/2020 1950   K 3.9 11/11/2020 1950   CL 105 11/11/2020 1950   CO2 26 11/11/2020 1950   BUN 10 11/11/2020 1950   CREATININE 1.09 11/11/2020 1950      Component Value Date/Time   CALCIUM 8.8 (L) 11/11/2020 1950       RADIOGRAPHIC STUDIES: I have personally reviewed the radiological images as listed and agreed with the findings in the report. DG Chest 2 View  Result Date: 11/11/2020 CLINICAL DATA:  Hemoptysis for 1 day EXAM: CHEST - 2 VIEW COMPARISON:  None. FINDINGS: The heart size and mediastinal contours are within normal limits. Both lungs are clear. The visualized skeletal structures are unremarkable. IMPRESSION: No active cardiopulmonary disease. Electronically Signed   By: Inez Catalina M.D.   On: 11/11/2020 20:10   CT SOFT TISSUE NECK W CONTRAST  Result Date: 11/10/2020 CLINICAL DATA:  Sialoadenitis.  Left neck swelling for 5 months EXAM: CT NECK WITH CONTRAST TECHNIQUE: Multidetector CT imaging of the neck was performed using the standard protocol following the bolus administration of intravenous contrast. CONTRAST:  16mL ISOVUE-300 IOPAMIDOL (ISOVUE-300) INJECTION 61% COMPARISON:  None. FINDINGS: Pharynx and larynx: Ulcerated mass at the left tonsillar fossa with primarily submucosal growth. On sagittal reformats maximal span is 3.6 cm. Salivary glands: No inflammation, mass, or stone. Thyroid:  Unremarkable Lymph nodes: The palpable complaint reflects an enlarged and heterogeneous left jugulodigastric node which measures up to 3.2 cm. 2 additional asymmetrically prominent nodes in the left upper jugular chain measuring up to 14 mm long axis. No contralateral adenopathy is seen. Vascular: Negative Limited intracranial: Negative Visualized orbits: Not covered Mastoids and visualized paranasal sinuses: Clear Skeleton: No bony erosion. Overlapping thyroid and hyoid on the left with depressed appearance of the left thyroid cartilage which may be related to prior trauma. Upper chest: Negative Other: These results will be called to the ordering clinician or representative by the Radiologist Assistant, and communication documented in the PACS or [employer]. IMPRESSION: 1. 3.6 cm ulcerated left tonsil mass with at  least 1 ipsilateral malignant node measuring up to 3.4 cm. 2. 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 Electronically Signed   By: Monte Fantasia M.D.   On: 11/10/2020 06:21   NM PET Image Initial (PI) Skull Base To Thigh  Result Date: 12/06/2020 CLINICAL DATA:  Initial treatment strategy for head neck carcinoma. LEFT tonsil carcinoma. EXAM: NUCLEAR MEDICINE PET SKULL BASE TO THIGH TECHNIQUE: 12.3 mCi F-18 FDG was injected intravenously. Full-ring PET imaging was performed from the skull base to thigh after the radiotracer. CT data was obtained and used for attenuation correction and anatomic localization. Fasting blood glucose: 90 mg/dl COMPARISON:  Neck CT 11/09/2020 FINDINGS: Mediastinal blood pool activity: SUV max Liver activity: SUV max NA NECK: LEFT tonsillar fossa on a there is a rim of intense hypermetabolic activity with SUV max equal 16.5 (image 34) activity appears confined to the mucosal/submucosal surface. Enlarged intensely hypermetabolic ipsilateral level 2 lymph node measures 15 mm short axis (image 37) with SUV max  equal 13.9. No additional hypermetabolic nodes are present along the cervical chains. Incidental CT findings: none CHEST: No hypermetabolic mediastinal lymph nodes. No suspicious pulmonary nodules. Incidental CT findings: none ABDOMEN/PELVIS: No abnormal hypermetabolic activity within the liver, pancreas, adrenal glands, or spleen. No hypermetabolic lymph nodes in the abdomen or pelvis. Incidental CT findings: none SKELETON: No focal hypermetabolic activity to suggest skeletal metastasis. Incidental CT findings: none IMPRESSION: 1. Hypermetabolic lesion in the LEFT tonsillar fossa corresponds to abnormal findings on comparison neck CT. Lesion appears confined to the mucosal/submucosal tissue. 2. 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 3. No evidence of contralateral adenopathy. 4. No evidence of distant metastatic disease. Electronically Signed   By: Suzy Bouchard M.D.   On: 12/06/2020 10:00    All questions were answered. The patient knows to call the clinic with any problems, questions or concerns. I spent 60 minutes in the care of this patient including H and P, review of records, counseling and coordination of care. We have reviewed imaging, pathology, staging, treatment options, adverse effects in detail today.    Gerald Pike, MD 12/06/2020 12:46 PM

## 2020-12-06 NOTE — Progress Notes (Signed)
Head and Neck Cancer Location of Tumor / Histology:  Squamous cell carcinoma of oropharynx, p16(+)  Patient presented with symptoms of:  chronic sore throat for the past few months.  At his last visit, he was noted to have 2+ cryptic tonsils bilaterally.  Persistent fullness was noted along the left submandibular gland.  He subsequently underwent a neck CT scan.  The CT showed primarily submucosal 3 cm mass within the left tonsillar fossa.  He was also noted to have a 3.2 cm left jugulodigastric lymph node.  The findings were concerning for left tonsillar malignancy and malignant lymph node. He is experiencing occasional dysphagia and odynophagia.  He denies any dyspnea or change in his voice.  PET Scan 12/03/2020 IMPRESSION: 1. Hypermetabolic lesion in the LEFT tonsillar fossa corresponds to abnormal findings on comparison neck CT. Lesion appears confined to the mucosal/submucosal tissue. 2. 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 3. No evidence of contralateral adenopathy. 4. No evidence of distant metastatic disease  Biopsies revealed:  11/22/2020 FINAL MICROSCOPIC DIAGNOSIS:  A. TONSIL, LEFT, TONSILLECTOMY:  - Invasive squamous cell carcinoma.  B. BASE OF TONGUE, LEFT, BIOPSY:  - Invasive squamous cell carcinoma.  COMMENT:  Invasive squamous cell carcinoma is observed at the cauterized/black inked resection margin of the tonsillectomy specimen. The base of tongue biopsy is comprised of multiple fragments of squamous cell carcinoma. Immunohistochemistry for p16 will be separately reported.  ADDENDUM:  Tumor cells are strong and diffusely positive for p16  Nutrition Status Yes No Comments  Weight changes? [x]  []  Lost ~21 pounds after tonsillectomy due to pain/difficulty swallowing  Swallowing concerns? [x]  []  Had some issues prior to surgery, but since tonsillectomy, reports it is painful to swallow and often feels like there is a lump in his esophagus. Also  struggles with thin liquids at times  PEG? []  [x]     Referrals Yes No Comments  Social Work? [x]  []    Dentistry? [x]  []  Sees Dr. Rogue Bussing at [place] therapy? [x]  []    Nutrition? [x]  []    Med/Onc? [x]  []  Dr. Arletha Pili [place] 12/06/2020   Safety Issues Yes No Comments  Prior radiation? []  [x]    Pacemaker/ICD? []  [x]    Possible current pregnancy? []  [x]  N/A  Is the patient on methotrexate? []  [x]     Tobacco/Marijuana/Snuff/ETOH use: Patient has never smoked or used smokeless tobacco. States he used to occasionally drink alcohol once a week, but since his diagnosis he has stopped. Denies any recreational drug use.  Past/Anticipated interventions by otolaryngology, if any:  11/22/2020 Dr. Leta Baptist 1. Left tonsillectomy 2. Direct laryngoscopy and tongue base biopsy  Past/Anticipated interventions by medical oncology, if any:  Under care of Dr. 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  At this time his clinical staging is at least a T2N1M0 or stage I HPV positive oropharyngeal cancer. --We have reviewed PET/CT imaging and discussed that there is no clear evidence of distant metastatic disease.  There is definitely ipsilateral lymphadenopathy.  We have discussed that possible options at this point include surgery versus CRT. --We have discussed about weekly versus every 21 days cisplatin dosing, mechanism of action, adverse effects including but not limited to fatigue, nausea, vomiting, ototoxicity, nephrotoxicity, increased risk of infections etc.  He understands that some of  the side effects can be permanent.  He is willing to take the every 21-day dosing, he wants to do the most aggressive  treatment. --We have discussed that every 21-day dosing of cisplatin has not been head-to-head compared to every weekly dosing but has been used in several studies as an alternative given the toxicity from every 21 days cisplatin.  We discussed that there is an ongoing trial comparing both of these regimens. --He also has some appointments at [facility] for second opinion.  He would like to talk to the medical oncologist there and would like to make a final decision.  He will have to reach out back to [place] before we place any orders for port or G-tube since he is not sure where he would like to do his treatment. --He will also be meeting Dr. Isidore Moos from radiation oncology tomorrow.  We have also discussed about port as well as a G-tube for chemotherapy administration and nutrition support respectively. --All his questions were answered to the best of my knowledge.  Thank you for consulting [place] in the care of this patient.  Please do not hesitate to contact us with any new additional questions or concerns.   Current Complaints / other details:  Patient has received the J&J COVID-19 vaccine, but not a booster

## 2020-12-06 NOTE — Assessment & Plan Note (Signed)
He has had severe pain since the left tonsillectomy and has been using two 7.5 mg doses of Percocets every 3-4 hours.  He has lost about 20 pounds in the past week because of lack of oral intake given the severe pain.  He will continue the current pain management.

## 2020-12-07 ENCOUNTER — Ambulatory Visit
Admission: RE | Admit: 2020-12-07 | Discharge: 2020-12-07 | Disposition: A | Discharge status: Home / Self Care | Payer: Managed Care, Other (non HMO) | Source: Ambulatory Visit | Attending: Radiation Oncology | Admitting: Radiation Oncology

## 2020-12-07 ENCOUNTER — Encounter: Payer: Self-pay | Admitting: *Deleted

## 2020-12-07 ENCOUNTER — Ambulatory Visit: Payer: Managed Care, Other (non HMO)

## 2020-12-07 ENCOUNTER — Encounter: Payer: Self-pay | Admitting: Radiation Oncology

## 2020-12-07 VITALS — BP 116/81 | HR 63 | Temp 97.0°F | Resp 18 | Ht 69.0 in | Wt 253.4 lb

## 2020-12-07 DIAGNOSIS — C090 Malignant neoplasm of tonsillar fossa: Secondary | ICD-10-CM

## 2020-12-07 DIAGNOSIS — K1120 Sialoadenitis, unspecified: Secondary | ICD-10-CM | POA: Diagnosis not present

## 2020-12-07 DIAGNOSIS — C109 Malignant neoplasm of oropharynx, unspecified: Secondary | ICD-10-CM | POA: Diagnosis not present

## 2020-12-07 DIAGNOSIS — C099 Malignant neoplasm of tonsil, unspecified: Secondary | ICD-10-CM

## 2020-12-07 MED ORDER — LIDOCAINE VISCOUS HCL 2 % MT SOLN: OROMUCOSAL | 3 refills | Status: DC

## 2020-12-07 NOTE — Progress Notes (Signed)
Radiation Oncology         [phone] ________________________________  Initial Outpatient Consultation  Name: Gerald [name] MRN: [id number]  Date: 12/07/2020  DOB: 05/03/1971  CC:[facility], [facility]  Leta Baptist, MD   REFERRING PHYSICIAN: Leta Baptist, MD  DIAGNOSIS: C09.0   ICD-10-CM   1. Primary tonsillar squamous cell carcinoma (HCC)  C09.9 lidocaine (XYLOCAINE) 2 % solution  2. 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HCC)  C10.9   3. Cancer of tonsillar fossa (HCC)  C09.0    Cancer Staging Cancer of tonsillar fossa ([place]) Staging form: Pharynx - HPV-Mediated Oropharynx, AJCC 8th Edition - Clinical stage from 12/07/2020: Stage I (cT2, cN1, cM0, p16+) - Signed by Eppie Gibson, MD on 12/07/2020 Stage prefix: Initial diagnosis  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[place]) Staging form: Pharynx - HPV-Mediated Oropharynx, AJCC 8th Edition - Clinical stage from 12/06/2020: Stage I (cT2, cN1, cM0, p16+) - Signed by Benay Pike, MD on 12/06/2020 Stage prefix: Initial diagnosis   CHIEF COMPLAINT: Here to discuss management of left tonsillar cancer  HISTORY OF PRESENT ILLNESS::Gerald [name] is a 50 y.o. male who presented with a five-month history of sore throat and left-sided neck swelling.  Subsequently, the patient saw Dr. [name], who noted cryptic tonsils bilaterally.  Pertinent imaging thus far includes: 1. Soft tissue neck CT scan on 11/09/2020 that showed a 3.6 cm ulcerated left tonsil mass with at least 1 ipsilateral malignant node that measured up to 3.4 cm. There was also noted to be distorted thyroid cartilage with overlapping left hyoid and thyroid cartilage, likely post-traumatic. 2. PET scan on 12/03/2020 that showed a hypermetabolic lesion in the left tonsillar fossa that corresponded to the abnormal findings on above CT scan. The lesion appeared confined to the mucosal/submucosal tissue. Additionally, there was a hypermetabolic ipsilateral metastatic level II  lymph node. There was no evidence of contralateral adenopathy, nor was there any evidence of distant metastatic disease.  I have personally reviewed his images.  Biopsy of the left tonsil and tongue base mass on 11/22/2020 revealed: invasive squamous cell carcinoma. p16 strongly and diffusely positive  He works for a [employer].  He is on temporary disability.  He is here with his wife today.  He has received the [employer] vaccine as of several months ago but no subsequent COVID shots.  He denies smoking or chewing tobacco.  He denies heavy drinking  Nutrition Status Yes No Comments  Weight changes? [x]  []  Lost ~21 pounds after tonsillectomy due to pain/difficulty swallowing  Swallowing concerns? [x]  []  Had some issues prior to surgery, but since tonsillectomy, reports it is painful to swallow and often feels like there is a lump in his esophagus. Also struggles with thin liquids at times  PEG? []  [x]     Referrals Yes No Comments  Social Work? [x]  []    Dentistry? [x]  []  Sees Dr. Rogue Bussing at [facility]? [x]  []    Nutrition? [x]  []    Med/Onc? [x]  []  Dr. Arletha Pili Iruku 12/06/2020   Safety Issues Yes No Comments  Prior radiation? []  [x]    Pacemaker/ICD? []  [x]    Possible current pregnancy? []  [x]  N/A  Is the patient on methotrexate? []  [x]     PREVIOUS RADIATION THERAPY: No  PAST MEDICAL HISTORY:  has a past medical history of Sleep apnea and Tonsillar mass (11/16/2020).    PAST SURGICAL HISTORY: Past Surgical History:  Procedure Laterality Date  . DIRECT LARYNGOSCOPY N/A 11/22/2020   Procedure: DIRECT LARYNGOSCOPY WITH  BIOPSY;  Surgeon: Leta Baptist, MD;  Location: [facility];  Service: ENT;  Laterality: N/A;  . FOOT SURGERY  2000   extra nivicular removal  . SPINE SURGERY    . TONSILLECTOMY Left 11/22/2020   Procedure: LEFT TONSILLECTOMY;  Surgeon: Leta Baptist, MD;  Location: [place];  Service: ENT;   Laterality: Left;    FAMILY HISTORY: family history is not on file.  SOCIAL HISTORY:  reports that he has never smoked. He has never used smokeless tobacco. He reports previous alcohol use. He reports that he does not use drugs.  ALLERGIES: Patient has no known allergies.  MEDICATIONS:  Current Outpatient Medications  Medication Sig Dispense Refill  . lidocaine (XYLOCAINE) 2 % solution Patient: Mix 1part 2% viscous lidocaine, 1part H20. Swish & swallow 80mL of diluted mixture, 79min before meals and at bedtime, up to QID 200 mL 3  . oxyCODONE-acetaminophen (PERCOCET) 7.5-325 MG tablet Take by mouth.     No current facility-administered medications for this encounter.    REVIEW OF SYSTEMS:  Notable for that above.   PHYSICAL EXAM:  height is 5\' 9"  (1.753 m) and weight is 253 lb 6 oz (114.9 kg). His temporal temperature is 97 F (36.1 C) (abnormal). His blood pressure is 116/81 and his pulse is 63. His respiration is 18 and oxygen saturation is 99%.   General: Alert and oriented, in no acute distress HEENT: Head is normocephalic. Extraocular movements are intact. Oropharynx is notable for scar tissue at the site of biopsy, left tonsillar fossa.  There is no exophytic tumor in this area but the tissue does appear irregular and could be consistent with endophytic tumor and biopsy changes; right tonsil is slightly enlarged Neck: Neck is notable for approximate 3 cm mass in level 2 of left neck  Heart: Regular in rate and rhythm with no murmurs, rubs, or gallops. Chest: Clear to auscultation bilaterally, with no rhonchi, wheezes, or rales. Abdomen: Soft, nontender, nondistended, with no rigidity or guarding. Lymphatics: see Neck Exam Skin: No concerning lesions. Musculoskeletal: symmetric strength and muscle tone throughout. Neurologic: Cranial nerves II through XII are grossly intact. No obvious focalities. Speech is fluent. Coordination is intact. Psychiatric: Judgment and insight are  intact. Affect is appropriate.   ECOG = 1  0 - Asymptomatic (Fully active, able to carry on all predisease activities without restriction)  1 - Symptomatic but completely ambulatory (Restricted in physically strenuous activity but ambulatory and able to carry out work of a light or sedentary nature. For example, light housework, office work)  2 - Symptomatic, <50% in bed during the day (Ambulatory and capable of all self care but unable to carry out any work activities. Up and about more than 50% of waking hours)  3 - Symptomatic, >50% in bed, but not bedbound (Capable of only limited self-care, confined to bed or chair 50% or more of waking hours)  4 - Bedbound (Completely disabled. Cannot carry on any self-care. Totally confined to bed or chair)  5 - Death   Eustace Pen MM, Creech RH, Tormey DC, et al. [phone]). "Toxicity and response criteria of the [facility]". [place] Oncol. 5 (6): 649-55   LABORATORY DATA:  Lab Results  Component Value Date   WBC 10.1 11/11/2020   HGB 15.5 11/11/2020   HCT 44.2 11/11/2020   MCV 95.9 11/11/2020   PLT 251 11/11/2020   CMP     Component Value Date/Time   NA 137 11/11/2020  1950   K 3.9 11/11/2020 1950   CL 105 11/11/2020 1950   CO2 26 11/11/2020 1950   GLUCOSE 91 11/11/2020 1950   BUN 10 11/11/2020 1950   CREATININE 1.09 11/11/2020 1950   CALCIUM 8.8 (L) 11/11/2020 1950   GFRNONAA >60 11/11/2020 1950      No results found for: TSH   RADIOGRAPHY: DG Chest 2 View  Result Date: 11/11/2020 CLINICAL DATA:  Hemoptysis for 1 day EXAM: CHEST - 2 VIEW COMPARISON:  None. FINDINGS: The heart size and mediastinal contours are within normal limits. Both lungs are clear. The visualized skeletal structures are unremarkable. IMPRESSION: No active cardiopulmonary disease. Electronically Signed   By: Inez Catalina M.D.   On: 11/11/2020 20:10   CT SOFT TISSUE NECK W CONTRAST  Result Date: 11/10/2020 CLINICAL DATA:  Sialoadenitis.   Left neck swelling for 5 months EXAM: CT NECK WITH CONTRAST TECHNIQUE: Multidetector CT imaging of the neck was performed using the standard protocol following the bolus administration of intravenous contrast. CONTRAST:  11mL ISOVUE-300 IOPAMIDOL (ISOVUE-300) INJECTION 61% COMPARISON:  None. FINDINGS: Pharynx and larynx: Ulcerated mass at the left tonsillar fossa with primarily submucosal growth. On sagittal reformats maximal span is 3.6 cm. Salivary glands: No inflammation, mass, or stone. Thyroid: Unremarkable Lymph nodes: The palpable complaint reflects an enlarged and heterogeneous left jugulodigastric node which measures up to 3.2 cm. 2 additional asymmetrically prominent nodes in the left upper jugular chain measuring up to 14 mm long axis. No contralateral adenopathy is seen. Vascular: Negative Limited intracranial: Negative Visualized orbits: Not covered Mastoids and visualized paranasal sinuses: Clear Skeleton: No bony erosion. Overlapping thyroid and hyoid on the left with depressed appearance of the left thyroid cartilage which may be related to prior trauma. Upper chest: Negative Other: These results will be called to the ordering clinician or representative by the Radiologist Assistant, and communication documented in the PACS or [employer]. IMPRESSION: 1. 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 2. 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 Electronically Signed   By: Monte Fantasia M.D.   On: 11/10/2020 06:21   NM PET Image Initial (PI) Skull Base To Thigh  Result Date: 12/06/2020 CLINICAL DATA:  Initial treatment strategy for head neck carcinoma. LEFT tonsil carcinoma. EXAM: NUCLEAR MEDICINE PET SKULL BASE TO THIGH TECHNIQUE: 12.3 mCi F-18 FDG was injected intravenously. Full-ring PET imaging was performed from the skull base to thigh after the radiotracer. CT data was  obtained and used for attenuation correction and anatomic localization. Fasting blood glucose: 90 mg/dl COMPARISON:  Neck CT 11/09/2020 FINDINGS: Mediastinal blood pool activity: SUV max Liver activity: SUV max NA NECK: LEFT tonsillar fossa on a there is a rim of intense hypermetabolic activity with SUV max equal 16.5 (image 34) activity appears confined to the mucosal/submucosal surface. Enlarged intensely hypermetabolic ipsilateral level 2 lymph node measures 15 mm short axis (image 37) with SUV max equal 13.9. No additional hypermetabolic nodes are present along the cervical chains. Incidental CT findings: none CHEST: No hypermetabolic mediastinal lymph nodes. No suspicious pulmonary nodules. Incidental CT findings: none ABDOMEN/PELVIS: No abnormal hypermetabolic activity within the liver, pancreas, adrenal glands, or spleen. No hypermetabolic lymph nodes in the abdomen or pelvis. Incidental CT findings: none SKELETON: No focal hypermetabolic activity to suggest skeletal metastasis. Incidental CT findings: none IMPRESSION: 1. Hypermetabolic lesion in the LEFT tonsillar fossa corresponds to abnormal findings on comparison neck  CT. Lesion appears confined to the mucosal/submucosal tissue. 2. 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 3. No evidence of contralateral adenopathy. 4. No evidence of distant metastatic disease. Electronically Signed   By: Suzy Bouchard M.D.   On: 12/06/2020 10:00      IMPRESSION/PLAN:  This is a delightful patient with head and neck cancer, p16+ oropharyngeal cancer.  Later this week he is getting second opinions at [facility] with medical oncology and radiation oncology.  He will not see a Teacher, early years/pre during those visits.  I offered to refer him to see a Teacher, early years/pre at [facility].  He would like to see someone at [facility].  I personally conferred with the [facility] ENT team of TORS surgeons and shared his imaging.  They think he may be a candidate.  We  will arrange consultation with their team ASAP.  He understands that his best curative options are concurrent chemoradiation for 7 weeks versus TORS followed by likely radiation and possible chemotherapy (though the goal of surgery would be to eliminate chemotherapy from the treatment paradigm).  We discussed the potential risks, benefits, and side effects of radiotherapy. We talked in detail about acute and late effects. We discussed that some of the most bothersome acute effects may be mucositis, dysgeusia, salivary changes, skin irritation, hair loss, dehydration, weight loss and fatigue. We talked about late effects which include but are not necessarily limited to dysphagia, hypothyroidism, nerve injury, vascular injury, spinal cord injury, xerostomia, trismus, neck edema, and potential injury to any of the tissues in the head and neck region. No guarantees of treatment were given. A consent form was signed and placed in the patient's medical record. The patient is enthusiastic about proceeding with treatment. I look forward to participating in the patient's care.    Simulation (treatment planning) will take place after his disposition/final treatment plan is determined and he is cleared by dentistry  We also discussed that the treatment of head and neck cancer is a multidisciplinary process to maximize treatment outcomes and quality of life. For this reason the following referrals have been or will be made; many of these appointments will be through our multidisciplinary clinic:   Medical oncology to discuss chemotherapy  ( done )   TORS surgical consultation at [facility] for dental evaluation, possible extractions in the radiation fields, and /or advice on reducing risk of cavities, osteoradionecrosis, or other oral issues.   Nutritionist for nutrition support during and after treatment.   Speech language pathology for swallowing and/or speech therapy.   Social work for social  support.    Physical therapy due to risk of lymphedema in neck and deconditioning.   Baseline labs including TSH.  We talked about the likelihood that he will benefit from taking 6 months off from work to devote himself to treatment, which will be rigorous.  We discussed measures to reduce the risk of infection during the COVID-19 pandemic.  He is not up-to-date on his vaccinations.  I recommended that he get the [employer] or Moderna shot as a booster for the [employer] vaccine that he received many months ago.  We talked about the relative safety of the vaccine and the risks of hospitalization or long-term complications from [place], particularly if he is not adequately vaccinated.  He talked with me about his hesitation regarding the vaccine.  After long discussion he stated that he has decided that he will get another shot.  He plans  to get it at his local pharmacy today.   On date of service, in total, I spent 65 minutes on this encounter. Patient was seen in person.  __________________________________________   Eppie Gibson, MD Medical Director of Head and Neck Oncology [place]  This document serves as a record of services personally performed by Eppie Gibson, MD. It was created on his behalf by Clerance Lav, a trained medical scribe. The creation of this record is based on the scribe's personal observations and the provider's statements to them. This document has been checked and approved by the attending provider.

## 2020-12-07 NOTE — Progress Notes (Signed)
[place] Work  Initial Assessment   Gerald [name] is a 50 y.o. year old male contacted by phone.  Patient is was recently diagnosed with Head and  Neck cancer. Clinical Social Work was referred by radiation and medical oncology for assessment of psychosocial needs.   SDOH (Social Determinants of Health) assessments performed: Yes SDOH Interventions   Flowsheet Row Most Recent Value  SDOH Interventions   Food Insecurity Interventions Intervention Not Indicated  Financial Strain Interventions Intervention Not Indicated  Housing Interventions Intervention Not Indicated  Intimate Partner Violence Interventions Intervention Not Indicated  Stress Interventions Intervention Not Indicated  Social Connections Interventions Intervention Not Indicated  Transportation Interventions Intervention Not Indicated      Distress Screen completed: Yes ONCBCN DISTRESS SCREENING 12/06/2020  Screening Type Initial Screening  Distress experienced in past week (1-10) 8  Emotional problem type Nervousness/Anxiety;Adjusting to illness;Boredom  Information Concerns Type Lack of info about treatment  Physical Problem type Pain  Physician notified of physical symptoms Yes  Referral to clinical social work Yes      Family/Social Information:  . Housing Arrangement: patient lives with wife  . Family members/support persons in your life? Family and Friends/Colleagues . Transportation concerns: no  . Employment: Out on work excuse. Income source: Short-Term Disability . Financial concerns: No o Type of concern: None . Food access concerns: no . Religious or spiritual practice: not indicated  . Medication Concerns: no  . :    Coping/ Adjustment to diagnosis: . Patient understands treatment plan and what happens next? yes . Concerns about diagnosis and/or treatment: Overwhelmed by information . Patient reported stressors: Adjusting to my illness . Hopes and priorities: successful treatment  outcome . Patient enjoys time with family/ friends . Current coping skills/ strengths: Ability for insight, Average or above average intelligence, Capable of independent living, Communication skills, Financial means and Supportive family/friends    SUMMARY: Patient identified a "very supportive" family.  Patient lives with his wife and stated his wife's family has "adopted me", and been helpful and supportive.  Patient is currently on medical leave and short term disability through his employer.  Patient works in Pharmacist, community.  Patient was appreciative of CSW contact and will connect with CSW and support team with questions or concerns.      Current SDOH Barriers:  . none  Clinical Social Work Clinical Goal(s):  . patient will work with SW to address concerns related to treatment needs.  Interventions: . Discussed common feeling and emotions when being diagnosed with cancer, and the importance of support during treatment . Informed patient of the support team roles and support services at [facility] . Provided CSW contact information and encouraged patient to call with any questions or concerns    Follow Up Plan: Patient will contact CSW with any support or resource needs Patient verbalizes understanding of plan: Yes    [place] , LCSW   Johnnye Lana, MSW, LCSW, OSW-C Clinical Social Worker [place] [phone]

## 2020-12-07 NOTE — Progress Notes (Signed)
Dental Form with Estimates of Radiation Dose      Diagnosis:    ICD-10-CM   1. Primary tonsillar squamous cell carcinoma (HCC)  C09.9 lidocaine (XYLOCAINE) 2 % solution    Prognosis: curative, might have TORS  Anticipated # of fractions: 30-35   (not definite he will have RT but moderately likely)  Daily?: yes  # of weeks of radiotherapy: 6-7  Chemotherapy?: possible  Anticipated xerostomia:  Mild permanent    Pre-simulation needs:   Scatter protection    Simulation: ASAP   Other Notes: posterior teeth will get close to or over 50Gy on LEFT  Please contact Eppie Gibson, MD, with patient's disposition after evaluation and/or dental treatment.

## 2020-12-08 ENCOUNTER — Other Ambulatory Visit: Payer: Self-pay | Admitting: Hematology and Oncology

## 2020-12-08 ENCOUNTER — Ambulatory Visit (INDEPENDENT_AMBULATORY_CARE_PROVIDER_SITE_OTHER): Payer: Managed Care, Other (non HMO) | Admitting: Otolaryngology

## 2020-12-10 ENCOUNTER — Encounter: Payer: Self-pay | Admitting: Radiation Oncology

## 2020-12-10 ENCOUNTER — Other Ambulatory Visit: Payer: Self-pay

## 2020-12-10 DIAGNOSIS — C109 Malignant neoplasm of oropharynx, unspecified: Secondary | ICD-10-CM

## 2020-12-10 DIAGNOSIS — R5381 Other malaise: Secondary | ICD-10-CM

## 2020-12-10 DIAGNOSIS — R5383 Other fatigue: Secondary | ICD-10-CM

## 2020-12-10 NOTE — Progress Notes (Signed)
I spoke with the patient by phone today.  He updated me on his second opinions at [facility].  I sent the following notes subsequently to Dr. Juluis Rainier and Dr. Nicolette Bang (who will see him next week for TORS opinion).  Mr. Criger would like to proceed with getting appointments in place to plan and receive chemoradiation here.  But, he is still looking forward to his consultation at [facility] next week.  Dentistry will see him next week as well (at [place], you see Arvid Right next week.  He saw the esteemed Dr. Leitha Schuller @ [facility] for a second opinion this week after seeing me.  Shanon Brow, thanks for seeing him! He spoke really highly of you).  One thing that Shanon Brow brought up (which is valid) is that we may want to consider a contralateral tonsil biopsy. That tonsil is mildly enlarged on exam, and mildly bright on PET.    Please let us know your thoughts and arrange bx if you agree.   He is learning towards ChRT here but interested in hearing about TORS. Thanks!

## 2020-12-13 ENCOUNTER — Other Ambulatory Visit: Payer: Self-pay | Admitting: Hematology and Oncology

## 2020-12-13 ENCOUNTER — Telehealth: Payer: Self-pay

## 2020-12-13 MED ORDER — OXYCODONE-ACETAMINOPHEN 7.5-325 MG PO TABS: 2.0000 | ORAL_TABLET | Freq: Four times a day (QID) | ORAL | 0 refills | Status: DC | PRN

## 2020-12-13 NOTE — Telephone Encounter (Signed)
Spoke with patient regarding pain medication refill. Informed him that Dr Chryl Heck called in enough medication to get him through until his appt with her later this week, where they will come up with a pain management plan. Verbalized understanding.

## 2020-12-14 ENCOUNTER — Encounter (HOSPITAL_COMMUNITY): Payer: Self-pay | Admitting: Dentistry

## 2020-12-14 ENCOUNTER — Other Ambulatory Visit: Payer: Self-pay

## 2020-12-14 ENCOUNTER — Ambulatory Visit (INDEPENDENT_AMBULATORY_CARE_PROVIDER_SITE_OTHER): Payer: Managed Care, Other (non HMO) | Admitting: Dentistry

## 2020-12-14 DIAGNOSIS — C760 Malignant neoplasm of head, face and neck: Secondary | ICD-10-CM | POA: Insufficient documentation

## 2020-12-14 DIAGNOSIS — M264 Malocclusion, unspecified: Secondary | ICD-10-CM

## 2020-12-14 DIAGNOSIS — K08109 Complete loss of teeth, unspecified cause, unspecified class: Secondary | ICD-10-CM

## 2020-12-14 DIAGNOSIS — Z01818 Encounter for other preprocedural examination: Secondary | ICD-10-CM

## 2020-12-14 DIAGNOSIS — K030 Excessive attrition of teeth: Secondary | ICD-10-CM

## 2020-12-14 DIAGNOSIS — K029 Dental caries, unspecified: Secondary | ICD-10-CM

## 2020-12-14 DIAGNOSIS — C109 Malignant neoplasm of oropharynx, unspecified: Secondary | ICD-10-CM

## 2020-12-14 DIAGNOSIS — C770 Secondary and unspecified malignant neoplasm of lymph nodes of head, face and neck: Secondary | ICD-10-CM | POA: Insufficient documentation

## 2020-12-14 NOTE — Progress Notes (Signed)
Department of Dental Medicine     OUTPATIENT CONSULTATION  Service Date:   12/14/2020  Patient Name:   Gerald [name] Date of Birth:   March 26, 1971 Medical Record Number: [id number]  Referring Provider:                Eppie Gibson, MD  TODAY'S VISIT   Assessment:   . There are no current signs of acute odontogenic infection including abscess, edema or erythema, or suspicious lesion requiring biopsy.   . Tooth #17 has occlusal caries  Recommendations:   . No dental intervention indicated prior to starting radiation at this time.   Plan:   . Discuss case with medical team and coordinate treatment as needed. . Return for delivery of scatter protection devices on 5/27.  Marland Kitchen Discussed in detail all treatment options and recommendations with the patient and they are agreeable to the plan.    Thank you for consulting with [facility] and for the opportunity to participate in this patient's treatment.  Should you have any questions or concerns, please contact the [facility] at [phone].   PROGRESS NOTE:   COVID-19 SCREENING:  The patient denies symptoms concerning for COVID-19 infection including fever, chills, cough, or newly developed shortness of breath.   HISTORY OF PRESENT ILLNESS: . Gerald [name] is a very pleasant 50 y.o. 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  The patient presents today for a medically necessary dental consultation as part of their pre-radiation work-up.   DENTAL HISTORY: . The patient reports that he last had a dental exam and cleaning in January of this year.  He does not believe any Xrays were taken during that visit.  His primary dentist is Dr. Rogue Bussing and he states he goes twice a year for recall visits.  He currently denies any dental/orofacial pain or sensitivity. . Patient is able to manage oral secretions.  Patient endorses  odynophagia and neck pain; denies dysphagia, dysphonia and SOB.  Patient denies fever, rigors and malaise.   CHIEF COMPLAINT:  . Here for a pre-radiation dental exam.   Patient Active Problem List   Diagnosis Date Noted  . Cancer of tonsillar fossa ([place]) 12/07/2020  . 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 Neoplasm related pain 12/06/2020  . Impotence due to erectile dysfunction 08/30/2012  . Insomnia 09/08/2011   Past Medical History:  Diagnosis Date  . Sleep apnea   . Tonsillar mass 11/16/2020   Past Surgical History:  Procedure Laterality Date  . DIRECT LARYNGOSCOPY N/A 11/22/2020   Procedure: DIRECT LARYNGOSCOPY WITH BIOPSY;  Surgeon: Leta Baptist, MD;  Location: [facility];  Service: ENT;  Laterality: N/A;  . FOOT SURGERY  2000   extra nivicular removal  . SPINE SURGERY    . TONSILLECTOMY Left 11/22/2020   Procedure: LEFT TONSILLECTOMY;  Surgeon: Leta Baptist, MD;  Location: [facility];  Service: ENT;  Laterality: Left;   No Known Allergies Current Outpatient Medications  Medication Sig Dispense Refill  . lidocaine (XYLOCAINE) 2 % solution Patient: Mix 1part 2% viscous lidocaine, 1part H20. Swish & swallow 61mL of diluted mixture, 46min before meals and at bedtime, up to QID 200 mL 3  . oxyCODONE-acetaminophen (PERCOCET) 7.5-325 MG tablet Take 2 tablets by mouth every 6 (six) hours as needed for up to 7 days for moderate pain or severe pain. 30 tablet 0  No current facility-administered medications for this visit.    LABS: Lab Results  Component Value Date   WBC 10.1 11/11/2020   HGB 15.5 11/11/2020   HCT 44.2 11/11/2020   MCV 95.9 11/11/2020   PLT 251 11/11/2020      Component Value Date/Time   NA 137 11/11/2020 1950   K 3.9 11/11/2020 1950   CL 105 11/11/2020 1950   CO2 26 11/11/2020 1950   GLUCOSE 91 11/11/2020 1950   BUN 10 11/11/2020 1950   CREATININE 1.09 11/11/2020 1950   CALCIUM 8.8 (L) 11/11/2020 1950    GFRNONAA >60 11/11/2020 1950   No results found for: INR, PROTIME No results found for: PTT  Social History   Socioeconomic History  . Marital status: Married    Spouse name: Not on file  . Number of children: Not on file  . Years of education: Not on file  . Highest education level: Not on file  Occupational History  . Not on file  Tobacco Use  . Smoking status: Never Smoker  . Smokeless tobacco: Never Used  Vaping Use  . Vaping Use: Never used  Substance and Sexual Activity  . Alcohol use: Not Currently    Comment: rare  . Drug use: Never  . Sexual activity: Not Currently  Other Topics Concern  . Not on file  Social History Narrative  . Not on file   Social Determinants of Health   Financial Resource Strain: Low Risk   . Difficulty of Paying Living Expenses: Not hard at all  Food Insecurity: No Food Insecurity  . Worried About Charity fundraiser in the Last Year: Never true  . Ran Out of Food in the Last Year: Never true  Transportation Needs: No Transportation Needs  . Lack of Transportation (Medical): No  . Lack of Transportation (Non-Medical): No  Physical Activity: Not on file  Stress: No Stress Concern Present  . Feeling of Stress : Only a little  Social Connections: Socially Integrated  . Frequency of Communication with Friends and Family: More than three times a week  . Frequency of Social Gatherings with Friends and Family: Three times a week  . Attends Religious Services: 1 to 4 times per year  . Active Member of Clubs or Organizations: Yes  . Attends Archivist Meetings: 1 to 4 times per year  . Marital Status: Married  Human resources officer Violence: Not At Risk  . Fear of Current or Ex-Partner: No  . Emotionally Abused: No  . Physically Abused: No  . Sexually Abused: No   No family history on file.   REVIEW OF SYSTEMS:  . Reviewed with the patient as per HPI. Psych: Patient denies having dental phobia.   VITAL SIGNS: BP 120/78 (BP  Location: Right Arm)   Pulse (!) 59   Temp 99.2 F (37.3 C) (Oral)    PHYSICAL EXAM: General:  Well-developed, comfortable and in no apparent distress. Neurological:  Alert and oriented to person, place and  time. Extraoral:  Facial symmetry present without any edema or erythema.  (+) Left side of neck notable for enlargement due to mass.  TMJ asymptomatic without clicks or crepitations. Maximum Interincisal Opening:  61 mm Intraoral:  Soft tissues appear well-perfused and mucous membranes moist.  FOM and vestibules soft and not raised. Oral cavity without mass or lesion. No signs of infection, parulis, sinus tract, edema or erythema evident upon exam.  (+) Narrow palatal vault   DENTAL EXAM:  . Hard  tissue exam completed and charted. Overall impression:  Good remaining dentition.  Missing teeth #1, #15, #16, #18 and #32.  Caries, existing restorations and crowns.   Oral hygiene:   Good    Periodontal:  Pink, healthy gingival tissue with blunted papilla. Caries:  #32 occlusal caries Endodontics:   #30 previously RCT with complete coronal coverage Removable/fixed prosthodontics:  #30 full-coverage PFM crown Occlusion:  Slight crossbite/unbalanced occlusion- #27 occludes edge to edge or slightly facial to #6.  Non-functional tooth #17. Other findings:  Attrition/wear: #6, #7, #10, #11 and #22-#27 incisal   RADIOGRAPHIC EXAM:  . PAN and Full Mouth Series exposed and interpreted.  Condyles seated bilaterally in fossas.  No evidence of abnormal pathology.  All visualized osseous structures appear WNL.    Missing teeth numbers 1, 15, 16, 18 and 32. Existing restorations. #30 previously endodontically treated with full-coverage crown; gutta percha fill appears to be exactly at apices of each root without any significant voids. Incisal edges of mandibular anterior teeth appear worn through enamel.   ASSESSMENT:  1.  SCC of tonsillar fossa 2.  Pre-radiation dental examination 3.   Missing teeth 4.  Caries 5.  Attrition/wear 6.  Malocclusion   PROCEDURES: . The common and significant side effects of radiation therapy to the head and neck were explained and discussed with the patient.  The discussion included side effects of trismus (limited opening), dysgeusia (loss of taste), xerostomia (dry mouth), radiation caries and osteoradionecrosis of the jaw.  I also discussed the importance of maintaining optimal oral hygiene and oral health before, during and after radiation to decrease the risk of developing radiation cavities and the need for any surgery such as extractions after therapy.    1. Upper and Lower alginate impressions taken and poured up in Type IV Microstone for fabrication of scatter protection devices.   2. Trismus appliance made using patient's baseline MIO (30 sticks).  Leta Speller, DAII demonstrated use of appliance.  Verbal and written postop instructions were given to the patient.   PLAN AND RECOMMENDATIONS: . I discussed the risks, benefits, and complications of various scenarios with the patient in relationship to their medical and dental conditions, which included systemic infection or other serious issues such as osteoradionecrosis that could potentially occur either before, during or after their anticipated radiation therapy if dental/oral concerns are not addressed.  I explained that if any chronic or acute dental/oral infection(s) are addressed and subsequently not maintained following medical optimization and recovery, their risk of the previously mentioned complications are just as high and could potentially occur postoperatively.  I explained all significant findings of the dental consultation with the patient including caries on tooth #17 which will be in the field of radiation, and the recommended care including preventative extraction vs maintaining optimal oral hygiene, using fluoride trays and having the tooth restored with a filling as soon as he  has completed radiation in order to reduce the risk for ORN should the tooth become infected later and require extraction or other surgical treatment.  The patient verbalized understanding of all findings, discussion, and recommendations. . We then discussed various treatment options to include no treatment, multiple extractions with alveoloplasty, pre-prosthetic surgery as indicated, periodontal therapy, dental restorations, root canal therapy, crown and bridge therapy, implant therapy, and replacement of missing teeth as indicated.  The patient verbalized understanding of all options, and currently wishes to proceed with not extracting tooth #17 at this time and remain vigilant in regards to dental recall visits  every 4-6 months, brushing/flossing and using fluoride trays.  Since he does have a regular dentist that he visits every 6 months for exams and cleanings and is well-aware of the risk for ORN and radiation caries/dry mouth after head and neck radiation particularly at the dose he will be given, I am okay with his decision to leave #17 and restore it later. . Return for delivery of scatter protection devices on 5/27 and then follow-up after the completion of radiation. . Plan to discuss all findings and recommendations with medical team and coordinate future care as needed.    o All questions and concerns were invited and addressed.  The patient tolerated today's visit well and departed in stable condition.   I spent in excess of 120 minutes during the conduct of this consultation and >50% of this time involved direct face-to-face encounter for counseling and/or coordination of the patient's care. Lynd Benson Norway, D.M.D.

## 2020-12-14 NOTE — Patient Instructions (Signed)
[place] Benson [place], D.M.D. Phone: [phone] Fax: [phone]   It was a pleasure seeing you today!  Please refer to the information below regarding your dental visit with [place].  Call if you have any questions or concerns that come up after you leave.   Thank you for letting us provide care for you.  If there is anything we can do for you, please let us know.    RADIATION THERAPY AND INFORMATION REGARDING YOUR TEETH   . XEROSTOMIA (DRY MOUTH):  Your salivary glands may be in the field of radiation.  Radiation may include all or only part of your salivary glands.  This will cause your saliva to dry up, and you will have a dry mouth.  The dry mouth will be for the rest of your life unless your radiation oncologist tells you otherwise.  Your saliva has many functions: 1. It wets your tongue for speaking. 2. It coats your teeth and the inside of your mouth for easier movement. 3. It helps with chewing and swallowing food. 4. It helps clean away harmful acid and toxic products made by the germs in your mouth, therefore it helps prevent cavities. 5. It kills some germs in your mouth and helps to prevent gum disease. 6. It helps to carry flavor to your taste buds.  Once you have lost your saliva, you will be at higher risk for tooth decay and gum disease.    What can be done to help improve your mouth when there's not enough saliva? Marland Kitchen Your dentist may give a recommendation for CLoSYS.  It will not bring back all of your saliva but may bring back some of it.  Also, your saliva may be thick and ropy or white and foamy.  It will not feel like it use to feel. . You will need to swish with water every time your mouth feels dry.  YOU CANNOT suck on any cough drops, mints, lemon drops, candy, vitamin C or any other products.  You cannot use anything other than water to make your mouth feel less dry.  If you want to drink anything else, you have to drink it  all at once and brush afterwards.  Be sure to discuss the details of your diet habits with your dentist or hygienist.   . RADIATION CARIES:  This is decay (cavities) that happens very quickly once your mouth is very dry due to radiation therapy.  Normally, cavities take six months to two years to become a problem.  When you have dry mouth, cavities may take as little as eight weeks to cause you a problem.    o Dental check-ups every two months are necessary as long as you have a dry mouth. Radiation caries typically, but not always, start at your gum line where it is hard to see the cavity.  It is therefore also hard to fill these cavities adequately.  This high rate of cavities happens because your mouth no longer has saliva and therefore the acid made by the germs starts the decay process.  Whenever you eat anything the germs in your mouth change the food into acid.  The acid then burns a small hole in your tooth.  This small hole is the beginning of a cavity.  If this is not treated then it will grow bigger and become a cavity.  The way to avoid this hole getting bigger is to use fluoride every evening as prescribed by your  dentist following your radiation. o NOTE:  You have to make sure that your teeth are very clean before you use the fluoride.  This fluoride in turn will strengthen your teeth and prepare them for another day of fighting acid. o If you develop radiation caries many times, the damage is so large that you will have to have all your teeth removed.  This could be a big problem if some of these teeth are in the field of radiation.  Further details of why this could be a big problem will follow (see Osteoradionecrosis below).   . DYSGEUSIA (LOSS OF TASTE):  This happens to varying degrees once you've had radiation therapy to your jaw region.  Many times taste is not completely lost, but becomes limited.  The loss of taste is mostly due to radiation affecting your taste buds.  However, if  you have no saliva in your mouth to carry the flavor to your taste buds, it would be difficult for your taste buds to taste anything.  That is why using water or a prescription for Salagen prior to meals and during meal times may help with some of the taste.  Keep in mind that taste generally returns very slowly over the course of several months or several years after radiation therapy.  Don't give up hope.   . TRISMUS (LIMITED JAW OPENING):  According to your radiation oncologist, your TMJ or jaw joints are going to be partially or fully in the field of radiation.  This means that over time the muscles that help you open and close your mouth may get stiff.  This will potentially result in your not being able to open your mouth wide enough or as wide as you can open it now.    Let me give you an example of how slowly this happens and how unaware people are of it:   . A gentlemen that had radiation therapy two years ago came back to me complaining that bananas are just too large for him to be able to fit them in between his teeth.  He was not able to open wide enough to bite into a banana.  This happens slowly and over a period of time.  What we do to try and prevent this:   1. Your dentist will probably give you a stack of sticks called a trismus exercise device.  This stack will help remind your muscles and your jaw joints to open up to the same distance every day.  Use these sticks every morning when you wake up, or according to the instructions given by your dentist.    2. You must use these sticks for at least one to two years after radiation therapy.  The reason for that is because it happens so slowly and keeps going on for about two years after radiation therapy.  Your hospital dentist will help you monitor your mouth opening and make sure that it's not getting smaller after radiation.  TRISMUS EXERCISES: 1. Using the stack of sticks given to you by your dentist, place the stack in your mouth and  hold onto the other end for support. 2. Leave the sticks in your mouth while holding the other end.  Allow 30 seconds for muscle stretching. 3. Rest for a few seconds. 4. Repeat 3-5 times. 5. This exercise is recommended in the mornings and evenings unless otherwise instructed. 6. The exercise should be done for a period of 2 YEARS after the end of radiation. Marland Kitchen  Your maximum jaw opening should be checked regularly at recall dental visits by your general dentist. . You should report any changes, soreness, or difficulties encountered when doing the exercises to your dentist.   . OSTEORADIONECROSIS (ORN):  This is a condition where your jaw bone after radiation therapy becomes very dry.  It has very little blood supply to keep it alive.  If you develop a cavity that turns into an abscess or an infection, then the jaw bone does not have enough blood supply to help fight the infection.  At this point it is very likely that the infection could cause the death of your jaw bone.  When you have dead bone it has to be removed.  Therefore, you might end up having to have surgery to remove part of your jaw bone, the part of the jaw bone that has been affected.     o Healing is also a problem if you are to have surgery (like a tooth extraction) in the areas where the bone has had radiation therapy.  If you have surgery, you need more blood supply to heal which is not available.  When blood supply and oxygen are not available, there is a chance for the bone to die. o Occasionally, ORN happens on its own with no obvious reason, but this is quite rare.  We believe that patients who continue to smoke and/or drink alcohol have a higher chance of having this problem. o Once your jaw bone has had radiation therapy, if there are any remaining teeth in that area, it is not recommended to have them pulled unless your dentist or oral surgeon is aware of your history of radiation and believes it is safe.  o The risks for ORN  either from infection or spontaneously occurring (with no reason) are life long.   QUESTIONS?  Call our office during office hours at [phone].

## 2020-12-16 ENCOUNTER — Encounter: Payer: Self-pay | Admitting: Hematology and Oncology

## 2020-12-16 ENCOUNTER — Inpatient Hospital Stay: Payer: Managed Care, Other (non HMO) | Admitting: Hematology and Oncology

## 2020-12-16 ENCOUNTER — Telehealth (HOSPITAL_COMMUNITY): Payer: Self-pay

## 2020-12-16 ENCOUNTER — Other Ambulatory Visit: Payer: Self-pay

## 2020-12-16 VITALS — BP 109/80 | HR 64 | Temp 97.8°F | Resp 17 | Ht 69.0 in | Wt 253.0 lb

## 2020-12-16 DIAGNOSIS — C090 Malignant neoplasm of tonsillar fossa: Secondary | ICD-10-CM

## 2020-12-16 DIAGNOSIS — C109 Malignant neoplasm of oropharynx, unspecified: Secondary | ICD-10-CM

## 2020-12-16 DIAGNOSIS — G893 Neoplasm related pain (acute) (chronic): Secondary | ICD-10-CM

## 2020-12-16 DIAGNOSIS — N529 Male erectile dysfunction, unspecified: Secondary | ICD-10-CM | POA: Diagnosis not present

## 2020-12-16 NOTE — Assessment & Plan Note (Signed)
>>  ASSESSMENT AND PLAN FOR IMPOTENCE DUE TO ERECTILE DYSFUNCTION WRITTEN ON 12/16/2020 10:28 AM BY IRUKU, PRAVEENA, MD  I do not see a problem with using Viagra  sparingly during chemotherapy as long as he feels well.

## 2020-12-16 NOTE — Telephone Encounter (Signed)
-----   Message from Criselda Peaches, MD sent at 12/16/2020 11:16 AM EDT ----- Regarding: RE: peg/port placement Approved.  Best to schedule at [facility] if at all possible.   HKM   ----- Message ----- From: Danielle Dess Sent: 12/16/2020  11:07 AM EDT To: Ir Procedure Requests Subject: peg/port placement                             Procedure: Peg/port placement  Dx: Squamous cell carcinoma of oropharynx  Imaging: NM Pet done 5/13 in epic   Ordering: Dr. Benay Pike [phone]  Please review.   Thanks,  Lia Foyer

## 2020-12-16 NOTE — Progress Notes (Signed)
START ON PATHWAY REGIMEN - Head and Neck     A cycle is every 7 days:     Cisplatin   **Always confirm dose/schedule in your pharmacy ordering system**  Patient Characteristics: Oropharynx, HPV Positive, Preoperative or Nonsurgical Candidate (Clinical Staging), cT0-4, cN1-3 or cT3-4, cN0 Disease Classification: Oropharynx HPV Status: Positive (+) Therapeutic Status: Preoperative or Nonsurgical Candidate (Clinical Staging) AJCC T Category: cT2 AJCC 8 Stage Grouping: I AJCC N Category: cN1 AJCC M Category: cM0 Intent of Therapy: Curative Intent, Discussed with Patient 

## 2020-12-16 NOTE — Assessment & Plan Note (Signed)
Pain continues to be an issue.  He is still taking about 2 tablets of 7.5 mg of oxycodone/Tylenol about every 12 hours or so.  He has cut down on the frequency.  I recommended that he does not take any other forms of Tylenol in between without reporting to [place].  I recommended that he try taking 1 tablet every 8 hours instead of 2 tablets every 12 hours if possible. He will inform us when he needs a new refill.  He understands that the pain will get more intense after starting chemoradiation therapy.

## 2020-12-16 NOTE — Progress Notes (Signed)
[facility] NOTE  Patient Care Team: Pa, Dare as PCP - General (Family Medicine)  CHIEF COMPLAINTS/PURPOSE OF CONSULTATION:  HPV positive SCC oropharynx.  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  During his initial visit, we have discussed his options of tors versus chemoradiation.  He went to see Dr. Nicolette Bang, also met with radiation oncology and medical oncology at [facility] and he is here back for a follow-up.  He was given the option of tors and neck dissection followed by adjuvant radiation plus or minus chemotherapy versus CRT.  He was hoping to proceed with chemoradiation locally. We have again reviewed the adverse effects of cisplatin including but not limited to fatigue, nephrotoxicity, ototoxicity, some level of immunosuppression, nausea. Once we can coordinate the radiation started, we will schedule all his infusion visits.  Impotence due to erectile dysfunction I do not see a problem with using Viagra sparingly during chemotherapy as long as he feels well.    Neoplasm related pain Pain continues to be an issue.  He is still taking about 2 tablets of 7.5 mg of oxycodone/Tylenol about every 12 hours or so.  He has cut down on the frequency.  I recommended that he does not take any other forms of Tylenol in between without reporting to [place].  I recommended that he try taking 1 tablet every 8 hours instead of 2 tablets every 12 hours if possible. He will inform us when he needs a new refill.  He understands that the pain will get more intense after starting chemoradiation therapy.  Orders Placed This Encounter  Procedures  . IR IMAGING GUIDED PORT INSERTION    Standing Status:   Future    Standing Expiration Date:    12/16/2021    Order Specific Question:   Reason for Exam (SYMPTOM  OR DIAGNOSIS REQUIRED)    Answer:   Chemotherapy adminstration    Order Specific Question:   Preferred Imaging Location?    Answer:   [facility]  . IR Gastrostomy Tube    Standing Status:   Future    Standing Expiration Date:   12/16/2021    Order Specific Question:   Reason for exam:    Answer:   Chemoradiation for SCC oropharynx.    Order Specific Question:   Preferred Imaging Location?    Answer:   [facility]     HISTORY OF PRESENTING ILLNESS:  Denzil Mceachron 50 y.o. male is here because of left tonsillar cancer, HPV positive.  Oncology History Overview Note  50 yr old male who has experiencing chronic sore throat for the past few months. He underwent a CT neck which showed primarily a submucosal 3 cm mass within the left tonsillar fossa also noted to have 3.2 cm left jugulodigastric lymph node, findings were concerning for left tonsillar malignancy and malignant lymph node. He underwent left tonsillectomy by Dr. [name] and pathology showed invasive squamous cell carcinoma, HPV mediated.  He He had PET imaging which showed evidence of left tonsillar cancer and left ipsilateral lymph nodes, from my review, no evidence of distant metastasis, I could not see an official report on the PET imaging. He is here for initial recommendations.   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[place])  12/06/2020 Initial  Diagnosis   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[place])   12/06/2020 Cancer Staging   Staging form: Pharynx - HPV-Mediated Oropharynx, AJCC 8th Edition - Clinical stage from 12/06/2020: Stage I (cT2, cN1, cM0, p16+) - Signed by Benay Pike, MD on 12/06/2020 Stage prefix: Initial diagnosis   12/28/2020 -  Chemotherapy    Patient is on Treatment Plan: HEAD/NECK CISPLATIN Q7D      Cancer of tonsillar fossa ([place])  12/07/2020 Initial Diagnosis   Cancer of tonsillar fossa ([place])   12/07/2020 Cancer Staging    Staging form: Pharynx - HPV-Mediated Oropharynx, AJCC 8th Edition - Clinical stage from 12/07/2020: Stage I (cT2, cN1, cM0, p16+) - Signed by Eppie Gibson, MD on 12/07/2020 Stage prefix: Initial diagnosis    Patient is here for follow up after visit with Dr Nicolette Bang. He was given options for TORS and neck dissection followed by adj radiation +/- chemotherapy vs CRT. He wants to proceed with CRT. He says he didn't hear anything different from [place] and he didn't want to do the clinical trial. He continues to deal with pain in his throat since tonsillectomy which has gotten better,  He has been using oxycodone at least once or twice a day for the past week or so.  He has not started using his pain medication prescription yet. He is anxious to get started and he says he is ready to proceed with chemoradiation.  He is willing to proceed with the port and the G-tube. Besides the pain and increased yawning which again triggers the pain cycle, he has been doing well overall.  Rest of the pertinent 10 point ROS reviewed and negative.  REVIEW OF SYSTEMS:    Constitutional: Denies fevers, chills or abnormal night sweats Eyes: Denies blurriness of vision, double vision or watery eyes Ears, nose, mouth, throat, and face: As mentioned in HPIRespiratory: Denies cough, dyspnea or wheezes Cardiovascular: Denies palpitation, chest discomfort or lower extremity swelling Gastrointestinal:  Denies nausea, heartburn or change in bowel habits Skin: Denies abnormal skin rashes Lymphatics: Denies new lymphadenopathy or easy bruising Neurological:Denies numbness, tingling or new weaknesses Behavioral/Psych: Mood is stable, no new changes  All other systems were reviewed with the patient and are negative.  MEDICAL HISTORY:  Past Medical History:  Diagnosis Date  . Sleep apnea   . Tonsillar mass 11/16/2020    SURGICAL HISTORY: Past Surgical History:  Procedure Laterality Date  . DIRECT LARYNGOSCOPY N/A  11/22/2020   Procedure: DIRECT LARYNGOSCOPY WITH BIOPSY;  Surgeon: Leta Baptist, MD;  Location: [facility];  Service: ENT;  Laterality: N/A;  . FOOT SURGERY  2000   extra nivicular removal  . SPINE SURGERY    . TONSILLECTOMY Left 11/22/2020   Procedure: LEFT TONSILLECTOMY;  Surgeon: Leta Baptist, MD;  Location: [facility];  Service: ENT;  Laterality: Left;    SOCIAL HISTORY: Social History   Socioeconomic History  . Marital status: Married    Spouse name: Not on file  . Number of children: Not on file  . Years of education: Not on file  . Highest education level: Not on file  Occupational History  . Not on file  Tobacco Use  . Smoking status: Never Smoker  . Smokeless tobacco: Never Used  Vaping Use  . Vaping Use: Never used  Substance and Sexual Activity  . Alcohol use: Not Currently    Comment: rare  . Drug use: Never  . Sexual activity: Not Currently  Other Topics Concern  . Not  on file  Social History Narrative  . Not on file   Social Determinants of Health   Financial Resource Strain: Low Risk   . Difficulty of Paying Living Expenses: Not hard at all  Food Insecurity: No Food Insecurity  . Worried About Charity fundraiser in the Last Year: Never true  . Ran Out of Food in the Last Year: Never true  Transportation Needs: No Transportation Needs  . Lack of Transportation (Medical): No  . Lack of Transportation (Non-Medical): No  Physical Activity: Not on file  Stress: No Stress Concern Present  . Feeling of Stress : Only a little  Social Connections: Socially Integrated  . Frequency of Communication with Friends and Family: More than three times a week  . Frequency of Social Gatherings with Friends and Family: Three times a week  . Attends Religious Services: 1 to 4 times per year  . Active Member of Clubs or Organizations: Yes  . Attends Archivist Meetings: 1 to 4 times per year  . Marital Status: Married  Human resources officer  Violence: Not At Risk  . Fear of Current or Ex-Partner: No  . Emotionally Abused: No  . Physically Abused: No  . Sexually Abused: No    FAMILY HISTORY: No family history on file.  ALLERGIES:  has No Known Allergies.  MEDICATIONS:  Current Outpatient Medications  Medication Sig Dispense Refill  . lidocaine (XYLOCAINE) 2 % solution Patient: Mix 1part 2% viscous lidocaine, 1part H20. Swish & swallow 38m of diluted mixture, 330m before meals and at bedtime, up to QID 200 mL 3  . oxyCODONE-acetaminophen (PERCOCET) 7.5-325 MG tablet Take 2 tablets by mouth every 6 (six) hours as needed for up to 7 days for moderate pain or severe pain. 30 tablet 0   No current facility-administered medications for this visit.    PHYSICAL EXAMINATION: ECOG PERFORMANCE STATUS: 1 - Symptomatic but completely ambulatory  Vitals:   12/16/20 0847  BP: 109/80  Pulse: 64  Resp: 17  Temp: 97.8 F (36.6 C)  SpO2: 100%   Filed Weights   12/16/20 0847  Weight: 253 lb (114.8 kg)    GENERAL:alert, no distress and comfortable, obese. EYES: normal, conjunctiva are pink and non-injected, sclera clear OROPHARYNX:no exudate, no erythema and lips, buccal mucosa. Firmness at the BOT on the left, tonsillar fossa appears healed well NECK: supple, thyroid normal size, non-tender, without nodularity, palpable LN left neck measuring over 3 cms on my exam. Some swelling of the right neck. LUNGS: clear to auscultation and percussion with normal breathing effort HEART: regular rate & rhythm and no murmurs and no lower extremity edema ABDOMEN:abdomen soft, non-tender and normal bowel sounds Musculoskeletal:no cyanosis of digits and no clubbing  PSYCH: alert & oriented x 3 with fluent speech NEURO: no focal motor/sensory deficits  LABORATORY DATA:  I have reviewed the data as listed Lab Results  Component Value Date   WBC 10.1 11/11/2020   HGB 15.5 11/11/2020   HCT 44.2 11/11/2020   MCV 95.9 11/11/2020   PLT  251 11/11/2020     Chemistry      Component Value Date/Time   NA 137 11/11/2020 1950   K 3.9 11/11/2020 1950   CL 105 11/11/2020 1950   CO2 26 11/11/2020 1950   BUN 10 11/11/2020 1950   CREATININE 1.09 11/11/2020 1950      Component Value Date/Time   CALCIUM 8.8 (L) 11/11/2020 1950       RADIOGRAPHIC STUDIES: I have personally  reviewed the radiological images as listed and agreed with the findings in the report. NM PET Image Initial (PI) Skull Base To Thigh  Result Date: 12/06/2020 CLINICAL DATA:  Initial treatment strategy for head neck carcinoma. LEFT tonsil carcinoma. EXAM: NUCLEAR MEDICINE PET SKULL BASE TO THIGH TECHNIQUE: 12.3 mCi F-18 FDG was injected intravenously. Full-ring PET imaging was performed from the skull base to thigh after the radiotracer. CT data was obtained and used for attenuation correction and anatomic localization. Fasting blood glucose: 90 mg/dl COMPARISON:  Neck CT 11/09/2020 FINDINGS: Mediastinal blood pool activity: SUV max Liver activity: SUV max NA NECK: LEFT tonsillar fossa on a there is a rim of intense hypermetabolic activity with SUV max equal 16.5 (image 34) activity appears confined to the mucosal/submucosal surface. Enlarged intensely hypermetabolic ipsilateral level 2 lymph node measures 15 mm short axis (image 37) with SUV max equal 13.9. No additional hypermetabolic nodes are present along the cervical chains. Incidental CT findings: none CHEST: No hypermetabolic mediastinal lymph nodes. No suspicious pulmonary nodules. Incidental CT findings: none ABDOMEN/PELVIS: No abnormal hypermetabolic activity within the liver, pancreas, adrenal glands, or spleen. No hypermetabolic lymph nodes in the abdomen or pelvis. Incidental CT findings: none SKELETON: No focal hypermetabolic activity to suggest skeletal metastasis. Incidental CT findings: none IMPRESSION: 1. Hypermetabolic lesion in the LEFT tonsillar fossa corresponds to abnormal findings on comparison  neck CT. Lesion appears confined to the mucosal/submucosal tissue. 2. 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 3. No evidence of contralateral adenopathy. 4. No evidence of distant metastatic disease. Electronically Signed   By: Suzy Bouchard M.D.   On: 12/06/2020 10:00    All questions were answered. The patient knows to call the clinic with any problems, questions or concerns. I spent 45 minutes in the care of this patient including H and P, review of records, counseling and coordination of care. We have reviewed treatment adverse effects, pain management, chest port, G tube and some other questions patient wanted to go through.    Benay Pike, MD 12/16/2020 10:29 AM

## 2020-12-16 NOTE — Assessment & Plan Note (Signed)
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  During his initial visit, we have discussed his options of tors versus chemoradiation.  He went to see Dr. Nicolette Bang, also met with radiation oncology and medical oncology at [facility] and he is here back for a follow-up.  He was given the option of tors and neck dissection followed by adjuvant radiation plus or minus chemotherapy versus CRT.  He was hoping to proceed with chemoradiation locally. We have again reviewed the adverse effects of cisplatin including but not limited to fatigue, nephrotoxicity, ototoxicity, some level of immunosuppression, nausea. Once we can coordinate the radiation started, we will schedule all his infusion visits.

## 2020-12-16 NOTE — Assessment & Plan Note (Signed)
I do not see a problem with using Viagra sparingly during chemotherapy as long as he feels well.

## 2020-12-17 ENCOUNTER — Ambulatory Visit (INDEPENDENT_AMBULATORY_CARE_PROVIDER_SITE_OTHER): Payer: Managed Care, Other (non HMO) | Admitting: Dentistry

## 2020-12-17 ENCOUNTER — Other Ambulatory Visit: Payer: Self-pay

## 2020-12-17 ENCOUNTER — Encounter (HOSPITAL_COMMUNITY): Payer: Self-pay | Admitting: Dentistry

## 2020-12-17 DIAGNOSIS — Z463 Encounter for fitting and adjustment of dental prosthetic device: Secondary | ICD-10-CM

## 2020-12-17 DIAGNOSIS — C090 Malignant neoplasm of tonsillar fossa: Secondary | ICD-10-CM

## 2020-12-17 NOTE — Progress Notes (Signed)
  Department of Dental Medicine    DELIVERY OF INTRAORAL DEVICE  Service Date:   12/17/2020  Patient Name:   Gerald [name] Date of Birth:   1971/07/19 Medical Record Number: [id number]  TODAY'S VISIT   Procedures:  . Delivered upper and lower scatter protection devices.  Plan: . Follow-up s/p radiation therapy and then return to primary dentist for routine dental treatment including cleanings, exams and replacement of missing teeth as needed.  PROGRESS NOTE:   COVID-19 SCREENING:  The patient denies symptoms concerning for COVID-19 infection including fever, chills, cough, or newly developed shortness of breath.   HISTORY OF PRESENT ILLNESS: . Gerald [name] presents today for delivery of upper and lower scatter protection devices. . Medical and dental history reviewed with the patient.  No changes reported.   CHIEF COMPLAINT:   . Patient with no complaints.  Here for a routine dental appointment.   Patient Active Problem List   Diagnosis Date Noted  . Cancer of tonsillar fossa ([place]) 12/07/2020  . 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[place]) 12/06/2020  . Neoplasm related pain 12/06/2020  . Impotence due to erectile dysfunction 08/30/2012  . Insomnia 09/08/2011   Past Medical History:  Diagnosis Date  . Sleep apnea   . Tonsillar mass 11/16/2020   Current Outpatient Medications  Medication Sig Dispense Refill  . lidocaine (XYLOCAINE) 2 % solution Patient: Mix 1part 2% viscous lidocaine, 1part H20. Swish & swallow 34mL of diluted mixture, 80min before meals and at bedtime, up to QID 200 mL 3  . oxyCODONE-acetaminophen (PERCOCET) 7.5-325 MG tablet Take 2 tablets by mouth every 6 (six) hours as needed for up to 7 days for moderate pain or severe pain. 30 tablet 0   No current facility-administered medications for this visit.   No Known Allergies   VITALS: BP 105/71 (BP Location: Right Arm)   Pulse (!) 59   Temp 98.2 F (36.8 C) (Oral)    ASSESSMENT:  . Patient  with anticipated radiation therapy to the head and neck.   PROCEDURES: . Delivery of upper and lower scatter protection devices. 1. Appliances were tried in and adjusted as needed.  Polished. 2. Postoperative instructions were provided in a written and verbal format concerning the use and care of appliances.   PLAN: . Return following the completion of radiation therapy for a follow-up appointment. . Return to primary dentist for routine dental care including cleanings, exams and replacement of missing teeth as needed. . Call if any questions or concerns arise.  o All questions and concerns were invited and addressed.  The patient tolerated today's visit well and departed in stable condition.  [place] Benson Norway, D.M.D.

## 2020-12-21 ENCOUNTER — Other Ambulatory Visit: Payer: Self-pay | Admitting: Hematology and Oncology

## 2020-12-21 MED ORDER — OXYCODONE-ACETAMINOPHEN 7.5-325 MG PO TABS: 1.0000 | ORAL_TABLET | Freq: Four times a day (QID) | ORAL | 0 refills | Status: AC | PRN

## 2020-12-22 ENCOUNTER — Other Ambulatory Visit: Payer: Self-pay | Admitting: Hematology and Oncology

## 2020-12-22 ENCOUNTER — Inpatient Hospital Stay: Payer: Managed Care, Other (non HMO)

## 2020-12-22 ENCOUNTER — Ambulatory Visit
Admission: RE | Admit: 2020-12-22 | Discharge: 2020-12-22 | Disposition: A | Discharge status: Home / Self Care | Payer: Managed Care, Other (non HMO) | Source: Ambulatory Visit | Attending: Radiation Oncology | Admitting: Radiation Oncology

## 2020-12-22 ENCOUNTER — Encounter: Payer: Self-pay | Admitting: Hematology and Oncology

## 2020-12-22 ENCOUNTER — Other Ambulatory Visit: Payer: Self-pay

## 2020-12-22 DIAGNOSIS — Z931 Gastrostomy status: Secondary | ICD-10-CM | POA: Insufficient documentation

## 2020-12-22 DIAGNOSIS — C109 Malignant neoplasm of oropharynx, unspecified: Secondary | ICD-10-CM

## 2020-12-22 DIAGNOSIS — Z7952 Long term (current) use of systemic steroids: Secondary | ICD-10-CM | POA: Insufficient documentation

## 2020-12-22 DIAGNOSIS — E860 Dehydration: Secondary | ICD-10-CM | POA: Insufficient documentation

## 2020-12-22 DIAGNOSIS — Z79899 Other long term (current) drug therapy: Secondary | ICD-10-CM | POA: Insufficient documentation

## 2020-12-22 DIAGNOSIS — R5381 Other malaise: Secondary | ICD-10-CM

## 2020-12-22 DIAGNOSIS — Z5111 Encounter for antineoplastic chemotherapy: Secondary | ICD-10-CM | POA: Insufficient documentation

## 2020-12-22 DIAGNOSIS — R293 Abnormal posture: Secondary | ICD-10-CM | POA: Diagnosis not present

## 2020-12-22 DIAGNOSIS — Z510 Encounter for antineoplastic radiation therapy: Secondary | ICD-10-CM | POA: Insufficient documentation

## 2020-12-22 DIAGNOSIS — G4730 Sleep apnea, unspecified: Secondary | ICD-10-CM | POA: Insufficient documentation

## 2020-12-22 DIAGNOSIS — C090 Malignant neoplasm of tonsillar fossa: Secondary | ICD-10-CM | POA: Insufficient documentation

## 2020-12-22 DIAGNOSIS — R1310 Dysphagia, unspecified: Secondary | ICD-10-CM | POA: Diagnosis present

## 2020-12-22 DIAGNOSIS — Z923 Personal history of irradiation: Secondary | ICD-10-CM | POA: Insufficient documentation

## 2020-12-22 DIAGNOSIS — G893 Neoplasm related pain (acute) (chronic): Secondary | ICD-10-CM | POA: Insufficient documentation

## 2020-12-22 LAB — TSH: TSH: 1.924 u[IU]/mL (ref 0.320–4.118)

## 2020-12-22 MED ORDER — DEXAMETHASONE 4 MG PO TABS: 8.0000 mg | ORAL_TABLET | Freq: Every day | ORAL | 1 refills | Status: DC

## 2020-12-22 MED ORDER — ONDANSETRON HCL 8 MG PO TABS: 8.0000 mg | ORAL_TABLET | Freq: Two times a day (BID) | ORAL | 1 refills | Status: DC | PRN

## 2020-12-22 MED ORDER — LIDOCAINE-PRILOCAINE 2.5-2.5 % EX CREA: TOPICAL_CREAM | CUTANEOUS | 3 refills | Status: DC

## 2020-12-22 MED ORDER — PROCHLORPERAZINE MALEATE 10 MG PO TABS: 10.0000 mg | ORAL_TABLET | Freq: Four times a day (QID) | ORAL | 1 refills | Status: DC | PRN

## 2020-12-22 NOTE — Progress Notes (Signed)
Oncology Nurse Navigator Documentation  To provide support, encouragement and care continuity, met with Gerald [name] during his CT SIM.  He tolerated procedure without difficulty, denied questions/concerns.  He did report minor anxiety related to the radiation mask, but plans to attempt his future treatments without anti-anxiety medicine. He will discuss with Dr. Isidore Moos if he decides he needs medication.   I encouraged him to call me prior to 12/29/20 [facility]. I accompanied him upstairs to register for his lab and chemotherapy education appointments.   I also provided PEG/port education prior to 12/24/20 placement.  Provided port educational handout, showed example, provided guidance for post-surgical dsg removal, site care.  . Using  PEG teaching device   and Teach Back, provided education for PEG use and care, including: hand hygiene, gravity bolus administration of daily water flushes and nutritional supplement, fluids and medications; care of tube insertion site including daily dressing change and cleaning; S&S of infection.   . Gerald [name] correctly verbalized procedures for and provided correct return demonstration of gravity administration of water, dressing change and site care.  . I provided written instructions for PEG flushing/dressing change in support of verbal instruction.   . I provided/described contents of Start of Care Bolus Feeding Kit (3 60 cc syringes, 2 boxes 4x4 drainage sponges, 1 package mesh briefs, 1 roll paper tape, 1 case Osmolite 1.5).  He voiced understanding he is to start using Osmolite per guidance of Nutrition. Marland Kitchen He understands I will be available for ongoing PEG support. I also provided barium sulfate prep which I obtained from [facility] and reviewed instructions.   Harlow Asa RN, BSN, OCN Head & Neck Oncology Nurse [place] at [facility] Phone # [phone]  Fax # [phone]    Harlow Asa RN, BSN, OCN Head &  Neck Oncology Nurse [place] at [facility] Phone # [phone]  Fax # [phone]

## 2020-12-23 ENCOUNTER — Other Ambulatory Visit: Payer: Self-pay | Admitting: Student

## 2020-12-24 ENCOUNTER — Telehealth: Payer: Self-pay | Admitting: Hematology and Oncology

## 2020-12-24 ENCOUNTER — Ambulatory Visit (HOSPITAL_COMMUNITY)
Admission: RE | Admit: 2020-12-24 | Discharge: 2020-12-24 | Disposition: A | Discharge status: Home / Self Care | Payer: Managed Care, Other (non HMO) | Source: Ambulatory Visit | Attending: Hematology and Oncology | Admitting: Hematology and Oncology

## 2020-12-24 ENCOUNTER — Other Ambulatory Visit: Payer: Self-pay

## 2020-12-24 DIAGNOSIS — C099 Malignant neoplasm of tonsil, unspecified: Secondary | ICD-10-CM | POA: Insufficient documentation

## 2020-12-24 DIAGNOSIS — R1310 Dysphagia, unspecified: Secondary | ICD-10-CM | POA: Diagnosis not present

## 2020-12-24 DIAGNOSIS — Z7951 Long term (current) use of inhaled steroids: Secondary | ICD-10-CM | POA: Diagnosis not present

## 2020-12-24 DIAGNOSIS — C109 Malignant neoplasm of oropharynx, unspecified: Secondary | ICD-10-CM

## 2020-12-24 HISTORY — PX: IR IMAGING GUIDED PORT INSERTION: IMG5740

## 2020-12-24 HISTORY — PX: IR GASTROSTOMY TUBE MOD SED: IMG625

## 2020-12-24 LAB — CBC
HCT: 41 % (ref 39.0–52.0)
Hemoglobin: 13.8 g/dL (ref 13.0–17.0)
MCH: 33.1 pg (ref 26.0–34.0)
MCHC: 33.7 g/dL (ref 30.0–36.0)
MCV: 98.3 fL (ref 80.0–100.0)
Platelets: 218 10*3/uL (ref 150–400)
RBC: 4.17 MIL/uL — ABNORMAL LOW (ref 4.22–5.81)
RDW: 12.3 % (ref 11.5–15.5)
WBC: 7.1 10*3/uL (ref 4.0–10.5)
nRBC: 0 % (ref 0.0–0.2)

## 2020-12-24 LAB — PROTIME-INR
INR: 1.1 (ref 0.8–1.2)
Prothrombin Time: 14.1 seconds (ref 11.4–15.2)

## 2020-12-24 LAB — APTT: aPTT: 28 seconds (ref 24–36)

## 2020-12-24 MED ORDER — ONDANSETRON HCL 4 MG/2ML IJ SOLN: 4.0000 mg | INTRAMUSCULAR | Status: DC | PRN

## 2020-12-24 MED ORDER — HEPARIN SOD (PORK) LOCK FLUSH 100 UNIT/ML IV SOLN
INTRAVENOUS | Status: AC
  Administered 2020-12-24: 5 [IU]
  Filled 2020-12-24: qty 5

## 2020-12-24 MED ORDER — FENTANYL CITRATE (PF) 100 MCG/2ML IJ SOLN
INTRAMUSCULAR | Status: AC
  Filled 2020-12-24: qty 2

## 2020-12-24 MED ORDER — MIDAZOLAM HCL 2 MG/2ML IJ SOLN
INTRAMUSCULAR | Status: AC
  Filled 2020-12-24: qty 2

## 2020-12-24 MED ORDER — CEFAZOLIN SODIUM-DEXTROSE 2-4 GM/100ML-% IV SOLN
INTRAVENOUS | Status: AC
  Administered 2020-12-24: 2 g via INTRAVENOUS
  Filled 2020-12-24: qty 100

## 2020-12-24 MED ORDER — SODIUM CHLORIDE 0.9 % IV SOLN
INTRAVENOUS | Status: DC
  Administered 2020-12-24: 10 mL/h via INTRAVENOUS

## 2020-12-24 MED ORDER — GLUCAGON HCL RDNA (DIAGNOSTIC) 1 MG IJ SOLR
INTRAMUSCULAR | Status: AC
  Filled 2020-12-24: qty 1

## 2020-12-24 MED ORDER — HYDROMORPHONE HCL 1 MG/ML IJ SOLN
1.0000 mg | INTRAMUSCULAR | Status: DC | PRN
Start: 2020-12-24 — End: 2020-12-25

## 2020-12-24 MED ORDER — FENTANYL CITRATE (PF) 100 MCG/2ML IJ SOLN
INTRAMUSCULAR | Status: AC | PRN
  Administered 2020-12-24: 25 ug via INTRAVENOUS
  Administered 2020-12-24 (×2): 50 ug via INTRAVENOUS
  Administered 2020-12-24: 75 ug via INTRAVENOUS
  Administered 2020-12-24 (×2): 50 ug via INTRAVENOUS
  Administered 2020-12-24: 100 ug via INTRAVENOUS

## 2020-12-24 MED ORDER — LIDOCAINE HCL (PF) 1 % IJ SOLN
INTRAMUSCULAR | Status: AC
  Filled 2020-12-24: qty 30

## 2020-12-24 MED ORDER — LIDOCAINE-EPINEPHRINE 1 %-1:100000 IJ SOLN
INTRAMUSCULAR | Status: AC
  Administered 2020-12-24: 10 mL via SUBCUTANEOUS
  Filled 2020-12-24: qty 1

## 2020-12-24 MED ORDER — OXYCODONE HCL ER 15 MG PO T12A: 15.0000 mg | EXTENDED_RELEASE_TABLET | Freq: Two times a day (BID) | ORAL | 0 refills | Status: AC | PRN

## 2020-12-24 MED ORDER — HYDROCODONE-ACETAMINOPHEN 5-325 MG PO TABS
1.0000 | ORAL_TABLET | ORAL | Status: DC | PRN
  Administered 2020-12-24: 2 via ORAL
  Filled 2020-12-24: qty 2

## 2020-12-24 MED ORDER — LIDOCAINE-EPINEPHRINE 1 %-1:100000 IJ SOLN
INTRAMUSCULAR | Status: AC
  Administered 2020-12-24: 30 mL via SUBCUTANEOUS
  Filled 2020-12-24: qty 1

## 2020-12-24 MED ORDER — MIDAZOLAM HCL 2 MG/2ML IJ SOLN
INTRAMUSCULAR | Status: AC | PRN
  Administered 2020-12-24 (×6): 1 mg via INTRAVENOUS

## 2020-12-24 MED ORDER — IOHEXOL 300 MG/ML  SOLN
50.0000 mL | Freq: Once | INTRAMUSCULAR | Status: AC | PRN
  Administered 2020-12-24: 15 mL

## 2020-12-24 MED ORDER — CEFAZOLIN SODIUM-DEXTROSE 2-4 GM/100ML-% IV SOLN: 2.0000 g | INTRAVENOUS | Status: AC

## 2020-12-24 NOTE — Progress Notes (Signed)
Orthopedic Tech Progress Note Patient Details:  Gerald [name] 07-14-1971 [id number]  Ortho Devices Type of Ortho Device: Abdominal binder Ortho Device/Splint Interventions: Ordered,Application   Post Interventions Patient Tolerated: Well   [name] 12/24/2020, 1:45 PM

## 2020-12-24 NOTE — Sedation Documentation (Signed)
Attempted to call report to short stay unit. Per Caryl Pina, unable to take report at this time. Will call back.

## 2020-12-24 NOTE — Sedation Documentation (Signed)
Called back to short stay to give report. Per Pam, unable to take report at this time. Awaiting callback for short stay to receive pt.

## 2020-12-24 NOTE — Procedures (Signed)
  Procedure: R port catheter placement, Gastrostomy catheter placement   EBL:   minimal Complications:  none immediate  See full dictation in [employer].  Dillard Cannon MD Main # [phone] Pager  [phone] Mobile [phone]

## 2020-12-24 NOTE — Discharge Instructions (Signed)
Implanted Port Insertion, Care After This sheet gives you information about how to care for yourself after your procedure. Your health care provider may also give you more specific instructions. If you have problems or questions, contact your health care provider. What can I expect after the procedure? After the procedure, it is common to have:  Discomfort at the port insertion site.  Bruising on the skin over the port. This should improve over 3-4 days. Follow these instructions at home: Port care  After your port is placed, you will get a manufacturer's information card. The card has information about your port. Keep this card with you at all times.  Take care of the port as told by your health care provider. Ask your health care provider if you or a family member can get training for taking care of the port at home. A home health care nurse may also take care of the port.  Make sure to remember what type of port you have. Incision care  Follow instructions from your health care provider about how to take care of your port insertion site. Make sure you: ? Wash your hands with soap and water before and after you change your bandage (dressing). If soap and water are not available, use hand sanitizer. ? Change your dressing as told by your health care provider. ? Leave stitches (sutures), skin glue, or adhesive strips in place. These skin closures may need to stay in place for 2 weeks or longer. If adhesive strip edges start to loosen and curl up, you may trim the loose edges. Do not remove adhesive strips completely unless your health care provider tells you to do that.  Check your port insertion site every day for signs of infection. Check for: ? Redness, swelling, or pain. ? Fluid or blood. ? Warmth. ? Pus or a bad smell.      Activity  Return to your normal activities as told by your health care provider. Ask your health care provider what activities are safe for you.  Do not  lift anything that is heavier than 10 lb (4.5 kg), or the limit that you are told, until your health care provider says that it is safe. General instructions  Take over-the-counter and prescription medicines only as told by your health care provider.  Do not take baths, swim, or use a hot tub until your health care provider approves. Ask your health care provider if you may take showers. You may only be allowed to take sponge baths.  Do not drive for 24 hours if you were given a sedative during your procedure.  Wear a medical alert bracelet in case of an emergency. This will tell any health care providers that you have a port.  Keep all follow-up visits as told by your health care provider. This is important. Contact a health care provider if:  You cannot flush your port with saline as directed, or you cannot draw blood from the port.  You have a fever or chills.  You have redness, swelling, or pain around your port insertion site.  You have fluid or blood coming from your port insertion site.  Your port insertion site feels warm to the touch.  You have pus or a bad smell coming from the port insertion site. Get help right away if:  You have chest pain or shortness of breath.  You have bleeding from your port that you cannot control. Summary  Take care of the port as told by your   health care provider. Keep the manufacturer's information card with you at all times.  Change your dressing as told by your health care provider.  Contact a health care provider if you have a fever or chills or if you have redness, swelling, or pain around your port insertion site.  Keep all follow-up visits as told by your health care provider. This information is not intended to replace advice given to you by your health care provider. Make sure you discuss any questions you have with your health care provider. Document Revised: 02/05/2018 Document Reviewed: 02/05/2018 Elsevier Patient Education   [place]. Gastrostomy Tube Home Guide, Adult A gastrostomy tube, or G-tube, is a tube that is inserted through the abdomen into the stomach. The tube is used to give feedings and medicines when a person cannot eat and drink enough on his or her own or take medicines by mouth. How to care for the insertion site Supplies needed:  Saline solution or clean, warm water and soap. Saline solution is made of salt and water.  Cotton swab or gauze.  Pre-cut gauze bandage (dressing) and tape, if needed. Instructions Follow these steps daily to clean the insertion site: 1. Wash your hands with soap and water for at least 20 seconds. 2. Remove the dressing (if there is one) that is between the person's skin and the tube. 3. Check the area where the tube enters the skin. Check daily for problems such as: ? Redness, rash, or irritation. ? Swelling. ? Pus-like drainage. ? Extra skin growth. 4. Moisten the cotton swab or gauze with the saline solution or with a soap-and-water mixture. Gently clean around the insertion site. Remove any drainage or crusted material. ? When the G-tube is first put in, a normal saline solution or water can be used to clean the skin. ? After the skin around the tube has healed, mild soap and water may be used. 5. Apply a dressing (if there should be one) between the person's skin and the tube.   How to flush a G-tube Flush the G-tube regularly to keep it from clogging. Flush it before and after feedings and as often as told by the health care provider. Supplies needed:  Purified or germ-free (sterile) water, warmed.  Container with lid for boiling water, if needed.  60 cc G-tube syringe. Instructions Before you begin, decide whether to use sterile water or purified drinking water.  Use only sterile water if: ? The person has a weak disease-fighting (immune) system. ? The person has trouble fighting off infections (is immunocompromised). ? You are unsure  about the amount of chemical contaminants in purified or drinking water.  Use purified drinking water in all other cases. To purify drinking water by boiling: ? Boil water for at least 1 minute. Keep lid over water while it boils. ? Let water cool to room temperature before using. Follow these steps to flush the G-tube: 1. Wash your hands with soap and water for at least 20 seconds. 2. Bring out (draw up) 30 mL of warm water in a syringe. 3. Connect the syringe to the tube. 4. Slowly and gently push the water into the tube. General tips  If the tube comes out: ? Cover the opening with a clean dressing and tape. ? Get help right away.  If there is skin or scar tissue growing where the tube enters the skin: ? Keep the area clean and dry. ? Secure the tube with tape so that the tube does  not move around too much.  If the tube gets clogged: ? Slowly push warm water into the tube with a large syringe. ? Do not force the fluid into the tube or push an object into the tube. ? Get help right away if you cannot unclog the tube. Follow these instructions at home: Feedings  Give feedings at room temperature.  If feedings are continuous: ? Do not put more than 4 hours' worth of feedings in the feeding bag. ? Stop the feedings when you need to give medicine or flush the tube. Be sure to restart the feedings. ? Make sure the person's head is above his or her stomach (upright position). This will prevent choking and discomfort.  Make sure the person is in the right position during and after feedings. ? During feedings, have the person in the upright position. ? After a non-continuous feeding (bolus feeding), have the person stay in the upright position for 1 hour.  Cover and place unused feedings in the refrigerator.  Replace feeding bags and syringes as told. Good hygiene  Make sure the person takes good care of his or her mouth and teeth (oral hygiene), such as by brushing his or her  teeth.  Keep the area where the tube enters the skin clean and dry. General instructions  Use syringes made only for G-tubes.  Do not pull or put tension on the tube.  Before you remove the tube cap or disconnect a syringe, close the tube by using a clamp (clamping) or bending (kinking) the tube.  Measure the length of the G-tube every day from the insertion site to the end of the tube.  If the person's G-tube has a balloon, check the fluid in the balloon every week. Check the manufacturer's specifications to find the amount of fluid that should be in the balloon.  Remove excess air from the G-tube as told. This is called venting.  Do not push feedings, medicines, or flushes fast. Contact a health care provider if:  The person with the tube has constipation or a fever.  A large amount of fluid or mucus-like liquid is leaking from the tube.  Skin or scar tissue appears to be growing where the tube enters the skin.  The length of tube from the insertion site to the G-tube gets longer. Get help right away if:  The person with the tube has any of these problems: ? Severe pain, tenderness, or bloating in the abdomen. ? Nausea or vomiting. ? Trouble breathing or shortness of breath.  Any of these problems happen in the area where the tube enters the skin: ? Redness, irritation, swelling, or soreness. ? Pus-like discharge. ? A bad smell.  The tube is clogged and cannot be flushed.  The tube comes out. The tube will need to put back in within 4 hours. Summary  A gastrostomy tube, or G-tube, is a tube that is inserted through the abdomen into the stomach. The tube is used to give feedings and medicines when a person cannot eat and drink enough on his or her own or cannot take medicine by mouth.  Check and clean the insertion site daily as told by the person's health care provider.  Flush the G-tube regularly to keep it from clogging. Flush it before and after feedings and as  often as told.  Keep the area where the tube enters the skin clean and dry. This information is not intended to replace advice given to you by your health care  provider. Make sure you discuss any questions you have with your health care provider. Document Revised: 11/24/2019 Document Reviewed: 11/27/2019 Elsevier Patient Education  2021 [place]. Moderate Conscious Sedation, Adult, Care After This sheet gives you information about how to care for yourself after your procedure. Your health care provider may also give you more specific instructions. If you have problems or questions, contact your health care provider. What can I expect after the procedure? After the procedure, it is common to have:  Sleepiness for several hours.  Impaired judgment for several hours.  Difficulty with balance.  Vomiting if you eat too soon. Follow these instructions at home: For the time period you were told by your health care provider:  Rest.  Do not participate in activities where you could fall or become injured.  Do not drive or use machinery.  Do not drink alcohol.  Do not take sleeping pills or medicines that cause drowsiness.  Do not make important decisions or sign legal documents.  Do not take care of children on your own.      Eating and drinking  Follow the diet recommended by your health care provider.  Drink enough fluid to keep your urine pale yellow.  If you vomit: ? Drink water, juice, or soup when you can drink without vomiting. ? Make sure you have little or no nausea before eating solid foods.   General instructions  Take over-the-counter and prescription medicines only as told by your health care provider.  Have a responsible adult stay with you for the time you are told. It is important to have someone help care for you until you are awake and alert.  Do not smoke.  Keep all follow-up visits as told by your health care provider. This is important. Contact a  health care provider if:  You are still sleepy or having trouble with balance after 24 hours.  You feel light-headed.  You keep feeling nauseous or you keep vomiting.  You develop a rash.  You have a fever.  You have redness or swelling around the IV site. Get help right away if:  You have trouble breathing.  You have new-onset confusion at home. Summary  After the procedure, it is common to feel sleepy, have impaired judgment, or feel nauseous if you eat too soon.  Rest after you get home. Know the things you should not do after the procedure.  Follow the diet recommended by your health care provider and drink enough fluid to keep your urine pale yellow.  Get help right away if you have trouble breathing or new-onset confusion at home. This information is not intended to replace advice given to you by your health care provider. Make sure you discuss any questions you have with your health care provider. Document Revised: 11/07/2019 Document Reviewed: 06/05/2019 Elsevier Patient Education  2021 [employer].

## 2020-12-24 NOTE — Progress Notes (Signed)
Abdominal binder placed by ortho. Pt tolerated well

## 2020-12-24 NOTE — Telephone Encounter (Signed)
Scheduled per 06/03 scheduled message, called patient and left a voicemail regarding upcoming appointments.

## 2020-12-24 NOTE — H&P (Signed)
Chief Complaint: Patient was seen in consultation today for port and g-tube placement.  Referring Physician(s): Benay Pike  Supervising Physician: Arne Cleveland  Patient Status: [facility] - Out-pt  History of Present Illness: Gerald [name] is a 50 y.o. 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 [name] noted a sore throat for several months earlier this year and underwent a CT neck which showed a primarily submucosal 3 cm mass within the left tonsillar fossa also noted to have 3.2 cm left jugulodigastric lymph node concerning for left tonsillar malignancy. He underwent left tonsillectomy with Dr. [name] on 5/2 and pathology showed invasive squamous cell carcinoma, HPV mediated. PET scan showed left tonsillar cancer and left ipsilateral lymph nodes without evidence of distant metastasis. He was referred to oncology (Dr. Chryl Heck) with plans to begin chemotherapy. IR has been asked to place port for durable venous access and g-tube for treatment related dysphagia.  Mr. Dispenza denies any complaints today, he has some rods and screws in his back which cause him some difficulty laying flat but he should be fine with a pillow under his knees during the procedure today. He underwent g-tube education recently and has a good understanding of post procedure care. He is not planning to use the g-tube unless absolutely necessary and hopes to be able to continue swallowing enough using exercises given to him, although he is not opposed to using it when he needs to. He understands both procedures today and is agreeable to proceed as planned.  Past Medical History:  Diagnosis Date  . Sleep apnea   . Tonsillar mass 11/16/2020    Past Surgical History:  Procedure Laterality Date  . DIRECT LARYNGOSCOPY N/A 11/22/2020   Procedure: DIRECT LARYNGOSCOPY WITH BIOPSY;  Surgeon: Leta Baptist, MD;  Location:  [facility];  Service: ENT;  Laterality: N/A;  . FOOT SURGERY  2000   extra nivicular removal  . SPINE SURGERY    . TONSILLECTOMY Left 11/22/2020   Procedure: LEFT TONSILLECTOMY;  Surgeon: Leta Baptist, MD;  Location: [facility];  Service: ENT;  Laterality: Left;    Allergies: Patient has no known allergies.  Medications: Prior to Admission medications   Medication Sig Start Date End Date Taking? Authorizing Provider  Multiple Vitamins-Minerals (MULTIVITAMIN WITH MINERALS) tablet Take 1 tablet by mouth daily.   Yes [provider]  oxyCODONE-acetaminophen (PERCOCET) 7.5-325 MG tablet Take 1-2 tablets by mouth every 6 (six) hours as needed for up to 7 days for severe pain. Patient taking differently: Take 2 tablets by mouth every 6 (six) hours as needed for severe pain. 12/21/20 12/28/20 Yes Iruku, Arletha Pili, MD  dexamethasone (DECADRON) 4 MG tablet Take 2 tablets (8 mg total) by mouth daily. Take daily x 3 days starting the day after cisplatin chemotherapy. Take with food. 12/22/20   Benay Pike, MD  lidocaine-prilocaine (EMLA) cream Apply to affected area once 12/22/20   Benay Pike, MD  ondansetron (ZOFRAN) 8 MG tablet Take 1 tablet (8 mg total) by mouth 2 (two) times daily as needed. Start on the third day after cisplatin chemotherapy. 12/22/20   Benay Pike, MD  prochlorperazine (COMPAZINE) 10 MG tablet Take 1 tablet (10 mg total) by mouth every 6 (six) hours as needed (Nausea or vomiting). 12/22/20   Benay Pike, MD  fluticasone (FLONASE) 50 MCG/ACT nasal spray Place 2 sprays into both nostrils daily. 06/06/13 11/11/20  Roselee Culver,  MD  sildenafil (VIAGRA) 100 MG tablet Take 1 tablet (100 mg total) by mouth daily as needed. 08/30/12 11/11/20  Gale Journey, Damaris Hippo, PA-C     No family history on file.  Social History   Socioeconomic History  . Marital status: Married    Spouse name: Not on file  . Number of children: Not on file  . Years of  education: Not on file  . Highest education level: Not on file  Occupational History  . Not on file  Tobacco Use  . Smoking status: Never Smoker  . Smokeless tobacco: Never Used  Vaping Use  . Vaping Use: Never used  Substance and Sexual Activity  . Alcohol use: Not Currently    Comment: rare  . Drug use: Never  . Sexual activity: Not Currently  Other Topics Concern  . Not on file  Social History Narrative  . Not on file   Social Determinants of Health   Financial Resource Strain: Low Risk   . Difficulty of Paying Living Expenses: Not hard at all  Food Insecurity: No Food Insecurity  . Worried About Charity fundraiser in the Last Year: Never true  . Ran Out of Food in the Last Year: Never true  Transportation Needs: No Transportation Needs  . Lack of Transportation (Medical): No  . Lack of Transportation (Non-Medical): No  Physical Activity: Not on file  Stress: No Stress Concern Present  . Feeling of Stress : Only a little  Social Connections: Socially Integrated  . Frequency of Communication with Friends and Family: More than three times a week  . Frequency of Social Gatherings with Friends and Family: Three times a week  . Attends Religious Services: 1 to 4 times per year  . Active Member of Clubs or Organizations: Yes  . Attends Archivist Meetings: 1 to 4 times per year  . Marital Status: Married     Review of Systems: A 12 point ROS discussed and pertinent positives are indicated in the HPI above.  All other systems are negative.  Review of Systems  Constitutional: Negative for chills and fever.  Respiratory: Negative for cough and shortness of breath.   Cardiovascular: Negative for chest pain.  Gastrointestinal: Negative for abdominal pain, nausea and vomiting.  Musculoskeletal: Positive for back pain (chronic).  Neurological: Negative for dizziness and headaches.    Vital Signs: BP 116/84   Pulse (!) 59   Resp 20   Ht 5\' 10"  (1.778 m)   Wt  251 lb (113.9 kg)   SpO2 99%   BMI 36.01 kg/m   Physical Exam Vitals reviewed.  Constitutional:      General: He is not in acute distress. HENT:     Head: Normocephalic.     Mouth/Throat:     Mouth: Mucous membranes are moist.     Pharynx: Oropharynx is clear. No oropharyngeal exudate or posterior oropharyngeal erythema.  Cardiovascular:     Rate and Rhythm: Normal rate and regular rhythm.  Pulmonary:     Effort: Pulmonary effort is normal.     Breath sounds: Normal breath sounds.  Abdominal:     General: There is no distension.     Palpations: Abdomen is soft.     Tenderness: There is no abdominal tenderness.  Skin:    General: Skin is warm and dry.     Findings: No rash.  Neurological:     Mental Status: He is alert and oriented to person, place, and time.  Psychiatric:  Mood and Affect: Mood normal.        Behavior: Behavior normal.        Thought Content: Thought content normal.        Judgment: Judgment normal.      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 EXAM: NUCLEAR MEDICINE PET SKULL BASE TO THIGH TECHNIQUE: 12.3 mCi F-18 FDG was injected intravenously. Full-ring PET imaging was performed from the skull base to thigh after the radiotracer. CT data was obtained and used for attenuation correction and anatomic localization. Fasting blood glucose: 90 mg/dl COMPARISON:  Neck CT 11/09/2020 FINDINGS: Mediastinal blood pool activity: SUV max Liver activity: SUV max NA NECK: LEFT tonsillar fossa on a there is a rim of intense hypermetabolic activity with SUV max equal 16.5 (image 34) activity appears confined to the mucosal/submucosal surface. Enlarged intensely hypermetabolic ipsilateral level 2 lymph node  measures 15 mm short axis (image 37) with SUV max equal 13.9. No additional hypermetabolic nodes are present along the cervical chains. Incidental CT findings: none CHEST: No hypermetabolic mediastinal lymph nodes. No suspicious pulmonary nodules. Incidental CT findings: none ABDOMEN/PELVIS: No abnormal hypermetabolic activity within the liver, pancreas, adrenal glands, or spleen. No hypermetabolic lymph nodes in the abdomen or pelvis. Incidental CT findings: none SKELETON: No focal hypermetabolic activity to suggest skeletal metastasis. Incidental CT findings: none IMPRESSION: 1. Hypermetabolic lesion in the LEFT tonsillar fossa corresponds to abnormal findings on comparison neck CT. Lesion appears confined to the mucosal/submucosal tissue. 2. 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 3. No evidence of contralateral adenopathy. 4. No evidence of distant metastatic disease. Electronically Signed   By: Suzy Bouchard M.D.   On: 12/06/2020 10:00    Labs:  CBC: Recent Labs    11/11/20 1950  WBC 10.1  HGB 15.5  HCT 44.2  PLT 251    COAGS: No results for input(s): INR, APTT in the last 8760 hours.  BMP: Recent Labs    11/11/20 1950  NA 137  K 3.9  CL 105  CO2 26  GLUCOSE 91  BUN 10  CALCIUM 8.8*  CREATININE 1.09  GFRNONAA >60    LIVER FUNCTION TESTS: No results for input(s): BILITOT, AST, ALT, ALKPHOS, PROT, ALBUMIN in the last 8760 hours.  TUMOR MARKERS: No results for input(s): AFPTM, CEA, CA199, CHROMGRNA in the last 8760 hours.  Assessment and Plan:  50 y/o M with recently diagnosed tonsillar cancer planned to undergo systemic chemotherapy and radiation who presents today for port placement for durable venous access as well as g-tube placement for treatment related dysphagia.   Patient has been NPO since 9 pm last night, no current anticoagulation/antiplatelet medications. Afebrile, pre-procedure labs pending and will be reviewed prior to  proceeding.  Risks and benefits of image-guided Port-a-catheter placement were discussed with the patient including, but not limited to bleeding, infection, pneumothorax, or fibrin sheath development and need for additional procedures.  Risks and benefits of gastrostomy tube placement discussed with the patient including, but not limited to the need for a barium enema during the procedure, bleeding, infection, peritonitis, or damage to adjacent structures.  All of the patient's questions were answered, patient is agreeable to proceed.  Consent signed and in chart.  Thank you for this interesting consult.  I greatly  enjoyed meeting [place] and look forward to participating in their care.  A copy of this report was sent to the requesting provider on this date.  Electronically Signed: Joaquim Nam, PA-C 12/24/2020, 7:50 AM   I spent a total of  30 Minutes   in face to face in clinical consultation, greater than 50% of which was counseling/coordinating care for port and g-tube placement.

## 2020-12-27 ENCOUNTER — Ambulatory Visit: Payer: Managed Care, Other (non HMO)

## 2020-12-27 ENCOUNTER — Encounter: Payer: Self-pay | Admitting: Hematology and Oncology

## 2020-12-27 ENCOUNTER — Inpatient Hospital Stay (HOSPITAL_BASED_OUTPATIENT_CLINIC_OR_DEPARTMENT_OTHER): Payer: Managed Care, Other (non HMO) | Admitting: Hematology and Oncology

## 2020-12-27 ENCOUNTER — Other Ambulatory Visit: Payer: Self-pay

## 2020-12-27 ENCOUNTER — Inpatient Hospital Stay: Payer: Managed Care, Other (non HMO)

## 2020-12-27 DIAGNOSIS — R1310 Dysphagia, unspecified: Secondary | ICD-10-CM | POA: Diagnosis not present

## 2020-12-27 DIAGNOSIS — C109 Malignant neoplasm of oropharynx, unspecified: Secondary | ICD-10-CM

## 2020-12-27 DIAGNOSIS — G893 Neoplasm related pain (acute) (chronic): Secondary | ICD-10-CM

## 2020-12-27 DIAGNOSIS — R293 Abnormal posture: Secondary | ICD-10-CM | POA: Diagnosis not present

## 2020-12-27 LAB — CBC WITH DIFFERENTIAL (CANCER CENTER ONLY)
Abs Immature Granulocytes: 0.03 10*3/uL (ref 0.00–0.07)
Basophils Absolute: 0.1 10*3/uL (ref 0.0–0.1)
Basophils Relative: 1 %
Eosinophils Absolute: 0.2 10*3/uL (ref 0.0–0.5)
Eosinophils Relative: 2 %
HCT: 40.4 % (ref 39.0–52.0)
Hemoglobin: 13.9 g/dL (ref 13.0–17.0)
Immature Granulocytes: 0 %
Lymphocytes Relative: 14 %
Lymphs Abs: 1.5 10*3/uL (ref 0.7–4.0)
MCH: 32.7 pg (ref 26.0–34.0)
MCHC: 34.4 g/dL (ref 30.0–36.0)
MCV: 95.1 fL (ref 80.0–100.0)
Monocytes Absolute: 0.7 10*3/uL (ref 0.1–1.0)
Monocytes Relative: 6 %
Neutro Abs: 8.3 10*3/uL — ABNORMAL HIGH (ref 1.7–7.7)
Neutrophils Relative %: 77 %
Platelet Count: 212 10*3/uL (ref 150–400)
RBC: 4.25 MIL/uL (ref 4.22–5.81)
RDW: 12.3 % (ref 11.5–15.5)
WBC Count: 10.7 10*3/uL — ABNORMAL HIGH (ref 4.0–10.5)
nRBC: 0 % (ref 0.0–0.2)

## 2020-12-27 LAB — BASIC METABOLIC PANEL - CANCER CENTER ONLY
Anion gap: 10 (ref 5–15)
BUN: 12 mg/dL (ref 6–20)
CO2: 24 mmol/L (ref 22–32)
Calcium: 9.7 mg/dL (ref 8.9–10.3)
Chloride: 103 mmol/L (ref 98–111)
Creatinine: 1.05 mg/dL (ref 0.61–1.24)
GFR, Estimated: >60 mL/min (ref >60)
Glucose, Bld: 102 mg/dL — ABNORMAL HIGH (ref 70–99)
Potassium: 4.1 mmol/L (ref 3.5–5.1)
Sodium: 137 mmol/L (ref 135–145)

## 2020-12-27 LAB — MAGNESIUM: Magnesium: 1.9 mg/dL (ref 1.7–2.4)

## 2020-12-27 MED ORDER — HEPARIN SOD (PORK) LOCK FLUSH 100 UNIT/ML IV SOLN
500.0000 [IU] | Freq: Once | INTRAVENOUS | Status: AC | PRN
  Administered 2020-12-27: 500 [IU]
  Filled 2020-12-27: qty 5

## 2020-12-27 MED ORDER — MAGNESIUM SULFATE 2 GM/50ML IV SOLN
INTRAVENOUS | Status: AC
  Filled 2020-12-27: qty 50

## 2020-12-27 MED ORDER — MAGNESIUM SULFATE 2 GM/50ML IV SOLN
2.0000 g | Freq: Once | INTRAVENOUS | Status: AC
  Administered 2020-12-27: 2 g via INTRAVENOUS

## 2020-12-27 MED ORDER — SODIUM CHLORIDE 0.9 % IV SOLN
150.0000 mg | Freq: Once | INTRAVENOUS | Status: AC
  Administered 2020-12-27: 150 mg via INTRAVENOUS
  Filled 2020-12-27: qty 150

## 2020-12-27 MED ORDER — PALONOSETRON HCL INJECTION 0.25 MG/5ML
0.2500 mg | Freq: Once | INTRAVENOUS | Status: AC
  Administered 2020-12-27: 0.25 mg via INTRAVENOUS

## 2020-12-27 MED ORDER — SODIUM CHLORIDE 0.9 % IV SOLN
Freq: Once | INTRAVENOUS | Status: AC
  Filled 2020-12-27: qty 250

## 2020-12-27 MED ORDER — SODIUM CHLORIDE 0.9 % IV SOLN
10.0000 mg | Freq: Once | INTRAVENOUS | Status: AC
  Administered 2020-12-27: 10 mg via INTRAVENOUS
  Filled 2020-12-27: qty 10

## 2020-12-27 MED ORDER — SODIUM CHLORIDE 0.9 % IV SOLN
40.0000 mg/m2 | Freq: Once | INTRAVENOUS | Status: AC
  Administered 2020-12-27: 94 mg via INTRAVENOUS
  Filled 2020-12-27: qty 94

## 2020-12-27 MED ORDER — POTASSIUM CHLORIDE IN NACL 20-0.9 MEQ/L-% IV SOLN
Freq: Once | INTRAVENOUS | Status: AC
Start: 2020-12-27 — End: 2020-12-27
  Filled 2020-12-27: qty 1000

## 2020-12-27 MED ORDER — PALONOSETRON HCL INJECTION 0.25 MG/5ML
INTRAVENOUS | Status: AC
  Filled 2020-12-27: qty 5

## 2020-12-27 MED ORDER — SODIUM CHLORIDE 0.9% FLUSH
10.0000 mL | INTRAVENOUS | Status: DC | PRN
  Administered 2020-12-27: 10 mL
  Filled 2020-12-27: qty 10

## 2020-12-27 NOTE — Assessment & Plan Note (Signed)
We have discussed about pain management in detail today. Patient appears to need large doses of narcotic medication for management of pain despite his surgery being few weeks ago.  He says now the pain is mostly around the G-tube which is again getting better but he has been taking about 3 tablets of oxycodone twice a day.  We have previously talked about taking 2 tablets every 6 hours or so as needed for pain control.  It is unusual to see patients needing such high doses of pain medication to control the pain around the G-tube or pain after tonsillectomy.  He says that he has high tolerance for pain medication and usually he needs large doses of pain medication to control the pain.  I have discussed with him that if he continues to need large doses of pain medication, we will have to refer him to pain management for better control of pain.  He states that he does not want to get addicted and he has seen people who have become dependent on these medications but he cannot help the situation since he becomes very uncomfortable when he does not take the pain medication and the pain does not let him sleep. I do understand his concern but he may be well served by pain management docs with more high potency pain medication if he continues to need high doses.  He is in agreement.

## 2020-12-27 NOTE — Progress Notes (Signed)
[facility] NOTE  Patient Care Team: Pa, [place] as PCP - General (Family Medicine)  CHIEF COMPLAINTS/PURPOSE OF CONSULTATION:  HPV positive SCC oropharynx.  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   During her previous visit, we have discussed about detail on cisplatin and expected adverse effects.  He is here for cycle 1 weekly cisplatin.  He mentions 1 episode of cold sweats this morning but no other active evidence of infection. We have reviewed his labs with mildly elevated white blood cell count and absolute neutrophil count but again no other source for infection.  Hence after making sure the patient is comfortable to proceed, we have agreed to proceed with planned cycle 1 of weekly cisplatin today.  He will return to clinic in 1 week for anticipated second week of cisplatin.  Neoplasm related pain We have discussed about pain management in detail today. Patient appears to need large doses of narcotic medication for management of pain despite his surgery being few weeks ago.  He says now the pain is mostly around the G-tube which is again getting better but he has been taking about 3 tablets of oxycodone twice a day.  We have previously talked about taking 2 tablets every 6 hours or so as needed for pain control.  It is unusual to see patients needing such high doses of pain medication to control the pain around the G-tube or pain after tonsillectomy.  He says that he has high tolerance for pain medication and usually he needs large doses of pain medication to control the pain.  I have discussed with him that if he continues to need large doses of pain medication, we will have to refer him to pain management  for better control of pain.  He states that he does not want to get addicted and he has seen people who have become dependent on these medications but he cannot help the situation since he becomes very uncomfortable when he does not take the pain medication and the pain does not let him sleep. I do understand his concern but he may be well served by pain management docs with more high potency pain medication if he continues to need high doses.  He is in agreement.  No orders of the defined types were placed in this encounter.    HISTORY OF PRESENTING ILLNESS:  Gerald [name] 50 y.o. male is here because of left tonsillar cancer, HPV positive.  Oncology History Overview Note  50 yr old male who has experiencing chronic sore throat for the past few months. He underwent a CT neck which showed primarily a submucosal 3 cm mass within the left tonsillar fossa also noted to have 3.2 cm left jugulodigastric lymph node, findings were concerning for left tonsillar malignancy and malignant lymph node. He underwent left tonsillectomy by Dr. [name] and pathology showed invasive squamous cell carcinoma, HPV mediated.  He He had PET imaging which showed evidence of left tonsillar cancer and left ipsilateral lymph nodes, from my review, no evidence of distant metastasis, I could not see an official report on the PET imaging. He is here for initial recommendations.   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[facility])  12/06/2020 Initial Diagnosis   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[place])  12/06/2020 Cancer Staging   Staging form: Pharynx - HPV-Mediated Oropharynx, AJCC 8th Edition - Clinical stage from 12/06/2020: Stage I (cT2, cN1, cM0, p16+) - Signed by Benay Pike, MD on 12/06/2020 Stage prefix: Initial diagnosis   12/27/2020 -  Chemotherapy    Patient is on Treatment Plan: HEAD/NECK CISPLATIN Q7D      Cancer of tonsillar fossa ([place])  12/07/2020 Initial Diagnosis   Cancer of tonsillar fossa ([place])    12/07/2020 Cancer Staging   Staging form: Pharynx - HPV-Mediated Oropharynx, AJCC 8th Edition - Clinical stage from 12/07/2020: Stage I (cT2, cN1, cM0, p16+) - Signed by Eppie Gibson, MD on 12/07/2020 Stage prefix: Initial diagnosis    After meeting with ENT team at [facility], patient has decided to move forward with CRT He is here for C1 of weekly cisplatin He reports having an episode of cold sweats this morning with chills. He hasnt eaten dinner yesterday ate his meals at 3 PM yesterday. Besides cold sweats, he denies any fevers, change in breathing, bowel habits or urinary habits. Some soreness around port but G tube site is painful which again is improving every day. He is anxious to get started. He continues to take about 6 tablets of oxycodone every day, he says this is primarily for G tube pain, throat doesn't hurt any more. He mentioned that he has has high tolerance for pain medication and he usually has to take a significant dose to see any impact in pain. Rest of the pertinent 10 point ROS reviewed and negative.  MEDICAL HISTORY:  Past Medical History:  Diagnosis Date  . Sleep apnea   . Tonsillar mass 11/16/2020    SURGICAL HISTORY: Past Surgical History:  Procedure Laterality Date  . DIRECT LARYNGOSCOPY N/A 11/22/2020   Procedure: DIRECT LARYNGOSCOPY WITH BIOPSY;  Surgeon: Leta Baptist, MD;  Location: [facility];  Service: ENT;  Laterality: N/A;  . FOOT SURGERY  2000   extra nivicular removal  . IR GASTROSTOMY TUBE MOD SED  12/24/2020  . IR IMAGING GUIDED PORT INSERTION  12/24/2020  . SPINE SURGERY    . TONSILLECTOMY Left 11/22/2020   Procedure: LEFT TONSILLECTOMY;  Surgeon: Leta Baptist, MD;  Location: [facility];  Service: ENT;  Laterality: Left;    SOCIAL HISTORY: Social History   Socioeconomic History  . Marital status: Married    Spouse name: Not on file  . Number of children: Not on file  . Years of education: Not on file  . Highest education  level: Not on file  Occupational History  . Not on file  Tobacco Use  . Smoking status: Never Smoker  . Smokeless tobacco: Never Used  Vaping Use  . Vaping Use: Never used  Substance and Sexual Activity  . Alcohol use: Not Currently    Comment: rare  . Drug use: Never  . Sexual activity: Not Currently  Other Topics Concern  . Not on file  Social History Narrative  . Not on file   Social Determinants of Health   Financial Resource Strain: Low Risk   . Difficulty of Paying Living Expenses: Not hard at all  Food Insecurity: No Food Insecurity  . Worried About Charity fundraiser in the Last Year: Never true  . Ran Out of Food in the Last Year: Never true  Transportation Needs: No Transportation Needs  . Lack of Transportation (Medical): No  . Lack of Transportation (Non-Medical): No  Physical Activity: Not on file  Stress: No Stress  Concern Present  . Feeling of Stress : Only a little  Social Connections: Socially Integrated  . Frequency of Communication with Friends and Family: More than three times a week  . Frequency of Social Gatherings with Friends and Family: Three times a week  . Attends Religious Services: 1 to 4 times per year  . Active Member of Clubs or Organizations: Yes  . Attends Archivist Meetings: 1 to 4 times per year  . Marital Status: Married  Human resources officer Violence: Not At Risk  . Fear of Current or Ex-Partner: No  . Emotionally Abused: No  . Physically Abused: No  . Sexually Abused: No    FAMILY HISTORY: No family history on file.  ALLERGIES:  has No Known Allergies.  MEDICATIONS:  Current Outpatient Medications  Medication Sig Dispense Refill  . Multiple Vitamins-Minerals (MULTIVITAMIN WITH MINERALS) tablet Take 1 tablet by mouth daily.    Marland Kitchen oxyCODONE (OXYCONTIN) 15 mg 12 hr tablet Take 1 tablet (15 mg total) by mouth every 12 (twelve) hours as needed for up to 3 days (For moderate to severe post procedure pain). 6 tablet 0  .  oxyCODONE-acetaminophen (PERCOCET) 7.5-325 MG tablet Take 1-2 tablets by mouth every 6 (six) hours as needed for up to 7 days for severe pain. (Patient taking differently: Take 2 tablets by mouth every 6 (six) hours as needed for severe pain.) 60 tablet 0  . dexamethasone (DECADRON) 4 MG tablet Take 2 tablets (8 mg total) by mouth daily. Take daily x 3 days starting the day after cisplatin chemotherapy. Take with food. 30 tablet 1  . lidocaine-prilocaine (EMLA) cream Apply to affected area once 30 g 3  . ondansetron (ZOFRAN) 8 MG tablet Take 1 tablet (8 mg total) by mouth 2 (two) times daily as needed. Start on the third day after cisplatin chemotherapy. 30 tablet 1  . prochlorperazine (COMPAZINE) 10 MG tablet Take 1 tablet (10 mg total) by mouth every 6 (six) hours as needed (Nausea or vomiting). 30 tablet 1   No current facility-administered medications for this visit.   Facility-Administered Medications Ordered in Other Visits  Medication Dose Route Frequency Provider Last Rate Last Admin  . CISplatin (PLATINOL) 94 mg in sodium chloride 0.9 % 250 mL chemo infusion  40 mg/m2 (Treatment Plan Recorded) Intravenous Once Benay Pike, MD 344 mL/hr at 12/27/20 1225 94 mg at 12/27/20 1225  . heparin lock flush 100 unit/mL  500 Units Intracatheter Once PRN Adie Vilar, MD      . magnesium sulfate IVPB 2 g 50 mL  2 g Intravenous Once Finnick Orosz, MD      . sodium chloride flush (NS) 0.9 % injection 10 mL  10 mL Intracatheter PRN Shaya Reddick, MD        PHYSICAL EXAMINATION: ECOG PERFORMANCE STATUS: 1 - Symptomatic but completely ambulatory  Vitals:   12/27/20 0836  BP: 110/75  Pulse: 70  Temp: 99 F (37.2 C)  SpO2: 98%   Filed Weights   12/27/20 0836  Weight: 252 lb 8 oz (114.5 kg)    Physical Exam Constitutional:      Appearance: Normal appearance.  HENT:     Head: Normocephalic and atraumatic.  Cardiovascular:     Rate and Rhythm: Normal rate and regular rhythm.      Pulses: Normal pulses.     Heart sounds: Normal heart sounds.  Pulmonary:     Effort: Pulmonary effort is normal.     Breath sounds: Normal breath sounds.  Abdominal:     General: Abdomen is flat. Bowel sounds are normal.     Palpations: Abdomen is soft.     Comments: G tube site appears well, mild erythema, but no other evidence of infection  Musculoskeletal:        General: No swelling, tenderness, deformity or signs of injury.     Cervical back: Normal range of motion and neck supple. No rigidity.  Lymphadenopathy:     Cervical: No cervical adenopathy.  Skin:    General: Skin is warm and dry.  Neurological:     General: No focal deficit present.     Mental Status: He is alert and oriented to person, place, and time.  Psychiatric:        Mood and Affect: Mood normal.        Behavior: Behavior normal.     LABORATORY DATA:  I have reviewed the data as listed Lab Results  Component Value Date   WBC 10.7 (H) 12/27/2020   HGB 13.9 12/27/2020   HCT 40.4 12/27/2020   MCV 95.1 12/27/2020   PLT 212 12/27/2020     Chemistry      Component Value Date/Time   NA 137 12/27/2020 0811   K 4.1 12/27/2020 0811   CL 103 12/27/2020 0811   CO2 24 12/27/2020 0811   BUN 12 12/27/2020 0811   CREATININE 1.05 12/27/2020 0811      Component Value Date/Time   CALCIUM 9.7 12/27/2020 0811       RADIOGRAPHIC STUDIES: I have personally reviewed the radiological images as listed and agreed with the findings in the report. IR Gastrostomy Tube  Result Date: 12/24/2020 CLINICAL DATA:  Tonsillar carcinoma, needs enteral feeding support EXAM: PERC PLACEMENT GASTROSTOMY FLUOROSCOPY TIME:  2 minutes 24 seconds; 58 mGy TECHNIQUE: The procedure, risks, benefits, and alternatives were explained to the patient. Questions regarding the procedure were encouraged and answered. The patient understands and consents to the procedure. As antibiotic prophylaxis, cefazolin 2 g was ordered pre-procedure and  administered intravenously within one hour of incision.Progression of previously administered oral barium into the colon was confirmed fluoroscopically. A 5 French angiographic catheter was placed as orogastric tube. The upper abdomen was prepped with Betadine, draped in usual sterile fashion, and infiltrated locally with 1% lidocaine. Intravenous Fentanyl 158mcg and Versed 4mg  were administered as conscious sedation during continuous monitoring of the patient's level of consciousness and physiological / cardiorespiratory status by the radiology RN, with a total moderate sedation time of 71 minutes. Stomach was insufflated using air through the orogastric tube. An 42 French sheath needle was advanced percutaneously into the gastric lumen under fluoroscopy. Gas could be aspirated and a small contrast injection confirmed intraluminal spread. The sheath was exchanged over a guidewire for a 9 [place] vascular sheath, through which the snare device was advanced and used to snare a guidewire passed through the orogastric tube. This was withdrawn, and the snare attached to the 20 French pull-through gastrostomy tube, which was advanced antegrade, positioned with the internal bumper securing the anterior gastric wall to the anterior abdominal wall. Small contrast injection confirms appropriate positioning. The external bumper was applied and the catheter was flushed. COMPLICATIONS: COMPLICATIONS none IMPRESSION: 1. Technically successful 20 French pull-through gastrostomy placement under fluoroscopy. Electronically Signed   By: Lucrezia Europe M.D.   On: 12/24/2020 13:30   NM PET Image Initial (PI) Skull Base To Thigh  Result Date: 12/06/2020 CLINICAL DATA:  Initial treatment strategy for head neck carcinoma. LEFT tonsil  carcinoma. EXAM: NUCLEAR MEDICINE PET SKULL BASE TO THIGH TECHNIQUE: 12.3 mCi F-18 FDG was injected intravenously. Full-ring PET imaging was performed from the skull base to thigh after the radiotracer. CT  data was obtained and used for attenuation correction and anatomic localization. Fasting blood glucose: 90 mg/dl COMPARISON:  Neck CT 11/09/2020 FINDINGS: Mediastinal blood pool activity: SUV max Liver activity: SUV max NA NECK: LEFT tonsillar fossa on a there is a rim of intense hypermetabolic activity with SUV max equal 16.5 (image 34) activity appears confined to the mucosal/submucosal surface. Enlarged intensely hypermetabolic ipsilateral level 2 lymph node measures 15 mm short axis (image 37) with SUV max equal 13.9. No additional hypermetabolic nodes are present along the cervical chains. Incidental CT findings: none CHEST: No hypermetabolic mediastinal lymph nodes. No suspicious pulmonary nodules. Incidental CT findings: none ABDOMEN/PELVIS: No abnormal hypermetabolic activity within the liver, pancreas, adrenal glands, or spleen. No hypermetabolic lymph nodes in the abdomen or pelvis. Incidental CT findings: none SKELETON: No focal hypermetabolic activity to suggest skeletal metastasis. Incidental CT findings: none IMPRESSION: 1. Hypermetabolic lesion in the LEFT tonsillar fossa corresponds to abnormal findings on comparison neck CT. Lesion appears confined to the mucosal/submucosal tissue. 2. 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 3. No evidence of contralateral adenopathy. 4. No evidence of distant metastatic disease. Electronically Signed   By: Suzy Bouchard M.D.   On: 12/06/2020 10:00   IR IMAGING GUIDED PORT INSERTION  Result Date: 12/24/2020 CLINICAL DATA:  Left tonsillar carcinoma EXAM: TUNNELED PORT CATHETER PLACEMENT WITH ULTRASOUND AND FLUOROSCOPIC GUIDANCE FLUOROSCOPY TIME:  6 seconds; 1 mGy ANESTHESIA/SEDATION: Intravenous Fentanyl 122mcg and Versed 2mg  were administered as conscious sedation during continuous monitoring of the patient's level of consciousness and physiological / cardiorespiratory status by the radiology RN, with a total moderate sedation time of 20  minutes. TECHNIQUE: The procedure, risks, benefits, and alternatives were explained to the patient. Questions regarding the procedure were encouraged and answered. The patient understands and consents to the procedure. Patency of the right IJ vein was confirmed with ultrasound with image documentation. An appropriate skin site was determined. Skin site was marked. Region was prepped using maximum barrier technique including cap and mask, sterile gown, sterile gloves, large sterile sheet, and Chlorhexidine as cutaneous antisepsis. The region was infiltrated locally with 1% lidocaine. Under real-time ultrasound guidance, the right IJ vein was accessed with a 21 gauge micropuncture needle; the needle tip within the vein was confirmed with ultrasound image documentation. Needle was exchanged over a 018 guidewire for transitional dilator, and vascular measurement was performed. A small incision was made on the right anterior chest wall and a subcutaneous pocket fashioned. The power-injectable port was positioned and its catheter tunneled to the right IJ dermatotomy site. The transitional dilator was exchanged over an Amplatz wire for a peel-away sheath, through which the port catheter, which had been trimmed to the appropriate length, was advanced and positioned under fluoroscopy with its tip at the cavoatrial junction. Spot chest radiograph confirms good catheter position and no pneumothorax. The port was flushed per protocol. The pocket was closed with deep interrupted and subcuticular continuous 3-0 Monocryl sutures. The incisions were covered with Dermabond then covered with a sterile dressing. The patient tolerated the procedure well. COMPLICATIONS: COMPLICATIONS None immediate IMPRESSION: Technically successful right IJ power-injectable port catheter placement. Ready for routine use. Electronically Signed   By: Lucrezia Europe M.D.   On: 12/24/2020 10:49    All questions were answered. The patient knows to call the  clinic with any problems, questions or concerns. I spent 30 minutes in the care of this patient, including H and P, review of records, counseling about pain management primarily.    Benay Pike, MD 12/27/2020 12:56 PM

## 2020-12-27 NOTE — Assessment & Plan Note (Signed)
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   During her previous visit, we have discussed about detail on cisplatin and expected adverse effects.  He is here for cycle 1 weekly cisplatin.  He mentions 1 episode of cold sweats this morning but no other active evidence of infection. We have reviewed his labs with mildly elevated white blood cell count and absolute neutrophil count but again no other source for infection.  Hence after making sure the patient is comfortable to proceed, we have agreed to proceed with planned cycle 1 of weekly cisplatin today.  He will return to clinic in 1 week for anticipated second week of cisplatin.

## 2020-12-27 NOTE — Patient Instructions (Signed)
[facility]    Discharge Instructions: Thank you for choosing [facility] to provide your oncology and hematology care.   If you have a lab appointment with the [facility], please go directly to the [facility] and check in at the registration area.   Wear comfortable clothing and clothing appropriate for easy access to any Portacath or PICC line.   We strive to give you quality time with your provider. You may need to reschedule your appointment if you arrive late (15 or more minutes).  Arriving late affects you and other patients whose appointments are after yours.  Also, if you miss three or more appointments without notifying the office, you may be dismissed from the clinic at the provider's discretion.      For prescription refill requests, have your pharmacy contact our office and allow 72 hours for refills to be completed.    Today you received the following chemotherapy and/or immunotherapy agents: cisplatin.      To help prevent nausea and vomiting after your treatment, we encourage you to take your nausea medication as directed.  BELOW ARE SYMPTOMS THAT SHOULD BE REPORTED IMMEDIATELY: . *FEVER GREATER THAN 100.4 F (38 C) OR HIGHER . *CHILLS OR SWEATING . *NAUSEA AND VOMITING THAT IS NOT CONTROLLED WITH YOUR NAUSEA MEDICATION . *UNUSUAL SHORTNESS OF BREATH . *UNUSUAL BRUISING OR BLEEDING . *URINARY PROBLEMS (pain or burning when urinating, or frequent urination) . *BOWEL PROBLEMS (unusual diarrhea, constipation, pain near the anus) . TENDERNESS IN MOUTH AND THROAT WITH OR WITHOUT PRESENCE OF ULCERS (sore throat, sores in mouth, or a toothache) . UNUSUAL RASH, SWELLING OR PAIN  . UNUSUAL VAGINAL DISCHARGE OR ITCHING   Items with * indicate a potential emergency and should be followed up as soon as possible or go to the Emergency Department if any problems should occur.  Please show the CHEMOTHERAPY ALERT CARD or IMMUNOTHERAPY  ALERT CARD at check-in to the Emergency Department and triage nurse.  Should you have questions after your visit or need to cancel or reschedule your appointment, please contact [facility]  Dept: [phone]  and follow the prompts.  Office hours are 8:00 a.m. to 4:30 p.m. Monday - Friday. Please note that voicemails left after 4:00 p.m. may not be returned until the following business day.  We are closed weekends and major holidays. You have access to a nurse at all times for urgent questions. Please call the main number to the clinic Dept: [phone] and follow the prompts.   For any non-urgent questions, you may also contact your provider using MyChart. We now offer e-Visits for anyone 42 and older to request care online for non-urgent symptoms. For details visit [email or web address].   Also download the MyChart app! Go to the app store, search "MyChart", open the app, select [facility], and log in with your MyChart username and password.  Due to Covid, a mask is required upon entering the hospital/clinic. If you do not have a mask, one will be given to you upon arrival. For doctor visits, patients may have 1 support person aged 85 or older with them. For treatment visits, patients cannot have anyone with them due to current Covid guidelines and our immunocompromised population.   Cisplatin injection What is this medicine? CISPLATIN (SIS pla tin) is a chemotherapy drug. It targets fast dividing cells, like cancer cells, and causes these cells to die. This medicine is used to treat many types of  cancer like bladder, ovarian, and testicular cancers. This medicine may be used for other purposes; ask your health care provider or pharmacist if you have questions. COMMON BRAND NAME(S): Platinol, Platinol -AQ What should I tell my health care provider before I take this medicine? They need to know if you have any of these conditions:  eye disease, vision  problems  hearing problems  kidney disease  low blood counts, like white cells, platelets, or red blood cells  tingling of the fingers or toes, or other nerve disorder  an unusual or allergic reaction to cisplatin, carboplatin, oxaliplatin, other medicines, foods, dyes, or preservatives  pregnant or trying to get pregnant  breast-feeding How should I use this medicine? This drug is given as an infusion into a vein. It is administered in a hospital or clinic by a specially trained health care professional. Talk to your pediatrician regarding the use of this medicine in children. Special care may be needed. Overdosage: If you think you have taken too much of this medicine contact a poison control center or emergency room at once. NOTE: This medicine is only for you. Do not share this medicine with others. What if I miss a dose? It is important not to miss a dose. Call your doctor or health care professional if you are unable to keep an appointment. What may interact with this medicine? This medicine may interact with the following medications:  foscarnet  certain antibiotics like amikacin, gentamicin, neomycin, polymyxin B, streptomycin, tobramycin, vancomycin This list may not describe all possible interactions. Give your health care provider a list of all the medicines, herbs, non-prescription drugs, or dietary supplements you use. Also tell them if you smoke, drink alcohol, or use illegal drugs. Some items may interact with your medicine. What should I watch for while using this medicine? Your condition will be monitored carefully while you are receiving this medicine. You will need important blood work done while you are taking this medicine. This drug may make you feel generally unwell. This is not uncommon, as chemotherapy can affect healthy cells as well as cancer cells. Report any side effects. Continue your course of treatment even though you feel ill unless your doctor tells  you to stop. This medicine may increase your risk of getting an infection. Call your healthcare professional for advice if you get a fever, chills, or sore throat, or other symptoms of a cold or flu. Do not treat yourself. Try to avoid being around people who are sick. Avoid taking medicines that contain aspirin, acetaminophen, ibuprofen, naproxen, or ketoprofen unless instructed by your healthcare professional. These medicines may hide a fever. This medicine may increase your risk to bruise or bleed. Call your doctor or health care professional if you notice any unusual bleeding. Be careful brushing and flossing your teeth or using a toothpick because you may get an infection or bleed more easily. If you have any dental work done, tell your dentist you are receiving this medicine. Do not become pregnant while taking this medicine or for 14 months after stopping it. Women should inform their healthcare professional if they wish to become pregnant or think they might be pregnant. Men should not father a child while taking this medicine and for 11 months after stopping it. There is potential for serious side effects to an unborn child. Talk to your healthcare professional for more information. Do not breast-feed an infant while taking this medicine. This medicine has caused ovarian failure in some women. This medicine may  make it more difficult to get pregnant. Talk to your healthcare professional if you are concerned about your fertility. This medicine has caused decreased sperm counts in some men. This may make it more difficult to father a child. Talk to your healthcare professional if you are concerned about your fertility. Drink fluids as directed while you are taking this medicine. This will help protect your kidneys. Call your doctor or health care professional if you get diarrhea. Do not treat yourself. What side effects may I notice from receiving this medicine? Side effects that you should report  to your doctor or health care professional as soon as possible:  allergic reactions like skin rash, itching or hives, swelling of the face, lips, or tongue  blurred vision  changes in vision  decreased hearing or ringing of the ears  nausea, vomiting  pain, redness, or irritation at site where injected  pain, tingling, numbness in the hands or feet  signs and symptoms of bleeding such as bloody or black, tarry stools; red or dark brown urine; spitting up blood or brown material that looks like coffee grounds; red spots on the skin; unusual bruising or bleeding from the eyes, gums, or nose  signs and symptoms of infection like fever; chills; cough; sore throat; pain or trouble passing urine  signs and symptoms of kidney injury like trouble passing urine or change in the amount of urine  signs and symptoms of low red blood cells or anemia such as unusually weak or tired; feeling faint or lightheaded; falls; breathing problems Side effects that usually do not require medical attention (report to your doctor or health care professional if they continue or are bothersome):  loss of appetite  mouth sores  muscle cramps This list may not describe all possible side effects. Call your doctor for medical advice about side effects. You may report side effects to FDA at 1-800-FDA-1088. Where should I keep my medicine? This drug is given in a hospital or clinic and will not be stored at home. NOTE: This sheet is a summary. It may not cover all possible information. If you have questions about this medicine, talk to your doctor, pharmacist, or health care provider.  2021 Elsevier/Gold Standard (2018-07-05 15:59:17)

## 2020-12-28 ENCOUNTER — Encounter: Payer: Self-pay | Admitting: Hematology and Oncology

## 2020-12-28 ENCOUNTER — Inpatient Hospital Stay: Payer: Managed Care, Other (non HMO) | Admitting: Hematology and Oncology

## 2020-12-28 ENCOUNTER — Telehealth: Payer: Self-pay | Admitting: *Deleted

## 2020-12-28 ENCOUNTER — Ambulatory Visit
Admission: RE | Admit: 2020-12-28 | Discharge: 2020-12-28 | Disposition: A | Discharge status: Home / Self Care | Payer: Managed Care, Other (non HMO) | Source: Ambulatory Visit | Attending: Radiation Oncology | Admitting: Radiation Oncology

## 2020-12-28 ENCOUNTER — Other Ambulatory Visit: Payer: Self-pay

## 2020-12-28 DIAGNOSIS — R1310 Dysphagia, unspecified: Secondary | ICD-10-CM | POA: Diagnosis not present

## 2020-12-28 DIAGNOSIS — R293 Abnormal posture: Secondary | ICD-10-CM | POA: Diagnosis not present

## 2020-12-28 NOTE — Progress Notes (Signed)
Called pt to introduce myself as his Arboriculturist and to discuss the [employer].  Unfortunately there aren't any foundations offering copay assistance for his Dx.  I left a msg requesting he return my call if he's interested in applying for the grant.

## 2020-12-28 NOTE — Telephone Encounter (Signed)
Called & left message for pt to call back to let us know how he did with his treatment yest.

## 2020-12-29 ENCOUNTER — Encounter: Payer: Self-pay | Admitting: Hematology and Oncology

## 2020-12-29 ENCOUNTER — Ambulatory Visit
Admission: RE | Admit: 2020-12-29 | Discharge: 2020-12-29 | Disposition: A | Discharge status: Home / Self Care | Payer: Managed Care, Other (non HMO) | Source: Ambulatory Visit | Attending: Radiation Oncology | Admitting: Radiation Oncology

## 2020-12-29 ENCOUNTER — Ambulatory Visit: Payer: Managed Care, Other (non HMO)

## 2020-12-29 DIAGNOSIS — R293 Abnormal posture: Secondary | ICD-10-CM | POA: Diagnosis not present

## 2020-12-29 DIAGNOSIS — R1310 Dysphagia, unspecified: Secondary | ICD-10-CM | POA: Diagnosis not present

## 2020-12-29 NOTE — Progress Notes (Signed)
Oncology Nurse Navigator Documentation   To provide support, encouragement and care continuity, met with Mr. Belisle for his initial RT and on 6/6 during his first Cisplatin infusion.    I reviewed the 2-step treatment process, answered questions.   Mr. Purdy completed treatment without difficulty, denied questions/concerns.  I reviewed the registration/arrival procedure for subsequent treatments.  I encouraged them to call me with questions/concerns as tmts proceed.   Harlow Asa RN, BSN, OCN Head & Neck Oncology Nurse [facility] at [facility] Phone # [phone]  Fax # [phone]

## 2020-12-30 ENCOUNTER — Ambulatory Visit: Payer: Managed Care, Other (non HMO) | Attending: Radiation Oncology

## 2020-12-30 ENCOUNTER — Ambulatory Visit
Admission: RE | Admit: 2020-12-30 | Discharge: 2020-12-30 | Disposition: A | Discharge status: Home / Self Care | Payer: Managed Care, Other (non HMO) | Source: Ambulatory Visit | Attending: Radiation Oncology | Admitting: Radiation Oncology

## 2020-12-30 ENCOUNTER — Other Ambulatory Visit: Payer: Self-pay

## 2020-12-30 DIAGNOSIS — R293 Abnormal posture: Secondary | ICD-10-CM | POA: Diagnosis not present

## 2020-12-30 DIAGNOSIS — R1310 Dysphagia, unspecified: Secondary | ICD-10-CM

## 2020-12-30 NOTE — Therapy (Addendum)
[facility] [address] [place], [place], [address] Phone: [phone]   Fax:  [phone]  Speech Language Pathology Evaluation  Patient Details  Name: Gerald [name] MRN: [id number] Date of Birth: 04-23-1971 Referring Provider (SLP): Eppie Gibson, MD   Encounter Date: 12/30/2020   End of Session - 12/30/20 1631     Visit Number 1    Number of Visits 4    Date for SLP Re-Evaluation 03/30/21    SLP Start Time 1525    SLP Stop Time  1607    SLP Time Calculation (min) 42 min    Activity Tolerance Patient tolerated treatment well             Past Medical History:  Diagnosis Date   Sleep apnea    Tonsillar mass 11/16/2020    Past Surgical History:  Procedure Laterality Date   DIRECT LARYNGOSCOPY N/A 11/22/2020   Procedure: DIRECT LARYNGOSCOPY WITH BIOPSY;  Surgeon: Leta Baptist, MD;  Location: [facility];  Service: ENT;  Laterality: N/A;   FOOT SURGERY  2000   extra nivicular removal   IR GASTROSTOMY TUBE MOD SED  12/24/2020   IR IMAGING GUIDED PORT INSERTION  12/24/2020   SPINE SURGERY     TONSILLECTOMY Left 11/22/2020   Procedure: LEFT TONSILLECTOMY;  Surgeon: Leta Baptist, MD;  Location: [facility];  Service: ENT;  Laterality: Left;    There were no vitals filed for this visit.   Subjective Assessment - 12/30/20 1528     Subjective "I want to put together an exercise plan so I can get throught this as best as I can."    Currently in Pain? No/denies                SLP Evaluation [facility] - 12/30/20 1539       SLP Visit Information   SLP Received On 12/30/20    Referring Provider (SLP) Eppie Gibson, MD    Onset Date Spring 2022    Medical Diagnosis lt tonsillar SCCA      Subjective   Subjective Pt tells SLP he takes more time when he swallows now than before.    Patient/Family Stated Goal Maintain WNL swallowing      General Information   HPI Pt is undergoing chemo-RT due  to lt tonsillar lesion. Has PEG placed. He started RT on 12-27-20. Soft tissue neck CT scan on 11/09/2020 showed a 3.6 cm ulcerated left tonsil mass with at least 1 ipsilateral malignant node that measured up to 3.4 cm.PET on 12-03-20 showed hypermetabolic lesion in lt tonsillar fossa that was similar to abnormal findings on CT. Ipsilateral lymph nodes also affected.     Prior Functional Status   Cognitive/Linguistic Baseline Within functional limits    Type of Home House     Lives With Spouse      Cognition   Overall Cognitive Status Within Functional Limits for tasks assessed      Auditory Comprehension   Overall Auditory Comprehension Appears within functional limits for tasks assessed      Verbal Expression   Overall Verbal Expression Appears within functional limits for tasks assessed      Oral Motor/Sensory Function   Labial ROM Within Functional Limits    Labial Strength Within Functional Limits    Lingual ROM Reduced left   to upper teeth   Lingual Strength Within Functional Limits    Lingual Coordination [facility]      Motor Speech  Overall Motor Speech Appears within functional limits for tasks assessed             Pt currently tolerates softer diet with many regular items, with thin liquids. POs: Pt ate a cereal bar and drank water without overt s/s aspiration. Thyroid elevation appeared adequate, and swallows appeared timely Pt's swallow deemed WNL/WFL at this time. Pt reported boluses traveling through oral cavity mostly on rt side.  Because data states the risk for dysphagia during and after radiation treatment is high due to undergoing radiation tx, SLP taught pt about the possibility of reduced/limited ability for PO intake during rad tx. SLP encouraged pt to continue swallowing POs as far into rad tx as possible, even ingesting POs and/or completing HEP shortly after administration of pain meds. Among other modifications for days when pt cannot functionally swallow, SLP  talked about performing only non-swallowing tasks on the handout/HEP, and if necessary to cycle through the swallowing portion so the program of exercises can be completed instead of fatiguing on one of the swallowing exercises and not being able to perform the other swallowing exercises. Pt was then told to add all reps of swallowing tasks back in when it becomes possible to do so.  SLP educated pt re: changes to swallowing musculature after rad tx, and why adherence to dysphagia HEP provided today and PO consumption was necessary to inhibit muscular disuse atrophy and to reduce muscle fibrosis following rad tx. Pt demonstrated understanding of these things to SLP.    SLP then developed a HEP for pt and pt was instructed how to perform exercises involving lingual, vocal, and pharyngeal strengthening. SLP performed each exercise and pt return demonstrated each exercise. SLP ensured pt performance was correct prior to moving on to next exercise. Pt was instructed to complete this program 2 times a day, 6-7 days/week until 6 months after his last rad tx, then x2 a week after that.                 SLP Education - 12/30/20 1629     Education Details swallow HEP procedure, late effects head/neck radiation on swallow ability    Person(s) Educated Patient    Methods Explanation;Demonstration;Verbal cues    Comprehension Verbalized understanding;Returned demonstration;Verbal cues required;Need further instruction              SLP Short Term Goals - 12/30/20 1635       SLP SHORT TERM GOAL #1   Title pt will tell SLP why pt is completing HEP with modified independence    Time 1    Period --   visits, for all STGs   Status New      SLP SHORT TERM GOAL #2   Title pt will complete HEP with rare min A in 2 sessions    Time 2    Status New      SLP SHORT TERM GOAL #3   Title pt will describe 3 overt s/s aspiration PNA with modified independence    Time 3    Status New      SLP  SHORT TERM GOAL #4   Title pt will tell SLP how a food journal could hasten return to a more normalized diet    Time 3    Status New              SLP Long Term Goals - 12/30/20 1637       SLP LONG TERM GOAL #1   Title pt  will complete HEP with modified independence over tree visits    Time 4    Period --   visits, for all LTGs   Status New      SLP LONG TERM GOAL #2   Title pt will complete HEP with independence over two visits    Time 5    Status New      SLP LONG TERM GOAL #3   Title pt will describe how to modify HEP over time, and the timeline associated with reduction in HEP frequency with modified independence over two sessions    Time 6    Status New              Plan - 12/30/20 1632     Clinical Impression Statement Pt reports moderate odynophagia which has now subsided since tonsillectomy. He reports boluses tend to travel more down rt channel than bil or left channel. At this time pt swallowing is deemed WNL/WFL with dys III (cereal bar) and water. SLP designed an individualized HEP for dysphagia and pt completed each exercise on their own with min cues faded to independent. There are no overt s/s aspiration PNA observed, and none reported by pt at this time. Data indicate that pt's swallow ability will likely decrease over the course of radiation therapy and could very well decline over time following conclusion of their radiation therapy due to muscle disuse atrophy and/or muscle fibrosis. Pt will cont to need to be seen by SLP in order to assess safety of PO intake, assess the need for recommending any objective swallow assessment, and ensuring pt correctly completes the individualized HEP.    Speech Therapy Frequency --   once approx every 4 weeks   Duration --   90 days/12 weeks   Treatment/Interventions Aspiration precaution training;Pharyngeal strengthening exercises;Diet toleration management by SLP;Trials of upgraded texture/liquids;Internal/external  aids;Patient/family education;SLP instruction and feedback;Compensatory strategies    Potential to Achieve Goals Good    SLP Home Exercise Plan provided today    Consulted and Agree with Plan of Care Patient             Patient will benefit from skilled therapeutic intervention in order to improve the following deficits and impairments:   Dysphagia, unspecified type    Problem List Patient Active Problem List   Diagnosis Date Noted   Cancer of tonsillar fossa ([facility]) 12/07/2020   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 [place], [place], [address] Phone: [phone]   Fax:  [phone]  Name: Gerald [name] MRN: [id number] Date of Birth: 06-09-1971

## 2020-12-30 NOTE — Patient Instructions (Signed)
SWALLOWING EXERCISES Do these until 6 months after your last day of radiation, then 2-3 times per week afterwards  Effortful Swallows - Press your tongue against the roof of your mouth for 3 seconds, then squeeze the muscles in your neck while you swallow your saliva or a sip of water - Repeat 10-15 times, 2-3 times a day, and use whenever you eat or drink  Masako Swallow - swallow with your tongue sticking out - Stick tongue out past your lips and gently bite tongue with your teeth - Swallow, while holding your tongue with your teeth - Repeat 10-15 times, 2-3 times a day *use a wet spoon if your mouth gets dry*   Shaker Exercise - head lift - Lie flat on your back in your bed or on a couch without pillows - Raise your head and look at your feet - KEEP YOUR SHOULDERS DOWN - HOLD FOR 45-60 SECONDS, then lower your head back down - Repeat 3 times, 2-3 times a day  Mendelsohn Maneuver - "half swallow" exercise - Start to swallow, and keep your Adam's apple up by squeezing hard with the muscles of the throat - Hold the squeeze for 5-7 seconds and then relax - Repeat 10-15 times, 2-3 times a day *use a wet spoon if your mouth gets dry*           5    "Super Swallow"  - Take a breath and hold it  - Bear down (like pushing your bowels)  - Swallow then IMMEDIATELY cough  - Repeat 10 times, 2-3 times a day  

## 2020-12-31 ENCOUNTER — Ambulatory Visit
Admission: RE | Admit: 2020-12-31 | Discharge: 2020-12-31 | Disposition: A | Discharge status: Home / Self Care | Payer: Managed Care, Other (non HMO) | Source: Ambulatory Visit | Attending: Radiation Oncology | Admitting: Radiation Oncology

## 2020-12-31 ENCOUNTER — Telehealth: Payer: Self-pay | Admitting: Hematology and Oncology

## 2020-12-31 ENCOUNTER — Encounter: Payer: Self-pay | Admitting: Hematology and Oncology

## 2020-12-31 DIAGNOSIS — R293 Abnormal posture: Secondary | ICD-10-CM | POA: Diagnosis not present

## 2020-12-31 DIAGNOSIS — R1310 Dysphagia, unspecified: Secondary | ICD-10-CM | POA: Diagnosis not present

## 2020-12-31 NOTE — Telephone Encounter (Signed)
Called patient per 05/26 los, patient is notified of all upcoming appointments.

## 2021-01-03 ENCOUNTER — Other Ambulatory Visit: Payer: Self-pay

## 2021-01-03 ENCOUNTER — Ambulatory Visit
Admission: RE | Admit: 2021-01-03 | Discharge: 2021-01-03 | Disposition: A | Discharge status: Home / Self Care | Payer: Managed Care, Other (non HMO) | Source: Ambulatory Visit | Attending: Radiation Oncology | Admitting: Radiation Oncology

## 2021-01-03 ENCOUNTER — Other Ambulatory Visit: Payer: Self-pay | Admitting: Hematology and Oncology

## 2021-01-03 DIAGNOSIS — R293 Abnormal posture: Secondary | ICD-10-CM | POA: Diagnosis not present

## 2021-01-03 DIAGNOSIS — C090 Malignant neoplasm of tonsillar fossa: Secondary | ICD-10-CM

## 2021-01-03 DIAGNOSIS — R1310 Dysphagia, unspecified: Secondary | ICD-10-CM | POA: Diagnosis not present

## 2021-01-03 MED ORDER — SONAFINE EX EMUL
1.0000 | Freq: Two times a day (BID) | CUTANEOUS | Status: DC
Start: 2021-01-03 — End: 2021-01-04
  Administered 2021-01-03: 1 via TOPICAL

## 2021-01-03 MED ORDER — OXYCODONE-ACETAMINOPHEN 7.5-325 MG PO TABS: 1.0000 | ORAL_TABLET | Freq: Four times a day (QID) | ORAL | 0 refills | Status: DC | PRN

## 2021-01-03 NOTE — Progress Notes (Signed)
Pt here for patient teaching.    Pt given Radiation and You booklet, Managing Acute Radiation Side Effects for Head and Neck Cancer handout, skin care instructions, and Sonafine.    Reviewed areas of pertinence such as fatigue, hair loss, mouth changes, skin changes, throat changes, earaches, and taste changes .   Pt able to give teach back of to pat skin, use unscented/gentle soap, and drink plenty of water,apply Sonafine bid, avoid applying anything to skin within 4 hours of treatment, and to use an electric razor if they must shave.   Pt demonstrated understanding and verbalizes understanding of information given and will contact nursing with any questions or concerns.    Http://rtanswers.org/treatmentinformation/whattoexpect/index         

## 2021-01-04 ENCOUNTER — Other Ambulatory Visit: Payer: Self-pay

## 2021-01-04 ENCOUNTER — Inpatient Hospital Stay (HOSPITAL_BASED_OUTPATIENT_CLINIC_OR_DEPARTMENT_OTHER): Payer: Managed Care, Other (non HMO) | Admitting: Hematology and Oncology

## 2021-01-04 ENCOUNTER — Inpatient Hospital Stay: Payer: Managed Care, Other (non HMO)

## 2021-01-04 ENCOUNTER — Encounter: Payer: Self-pay | Admitting: Hematology and Oncology

## 2021-01-04 ENCOUNTER — Ambulatory Visit
Admission: RE | Admit: 2021-01-04 | Discharge: 2021-01-04 | Disposition: A | Discharge status: Home / Self Care | Payer: Managed Care, Other (non HMO) | Source: Ambulatory Visit | Attending: Radiation Oncology | Admitting: Radiation Oncology

## 2021-01-04 ENCOUNTER — Inpatient Hospital Stay (HOSPITAL_BASED_OUTPATIENT_CLINIC_OR_DEPARTMENT_OTHER): Payer: Managed Care, Other (non HMO)

## 2021-01-04 ENCOUNTER — Telehealth: Payer: Self-pay | Admitting: Hematology and Oncology

## 2021-01-04 VITALS — BP 124/86 | HR 75 | Temp 97.9°F | Resp 18 | Ht 70.0 in | Wt 243.2 lb

## 2021-01-04 DIAGNOSIS — G893 Neoplasm related pain (acute) (chronic): Secondary | ICD-10-CM | POA: Diagnosis not present

## 2021-01-04 DIAGNOSIS — C109 Malignant neoplasm of oropharynx, unspecified: Secondary | ICD-10-CM | POA: Diagnosis not present

## 2021-01-04 DIAGNOSIS — R634 Abnormal weight loss: Secondary | ICD-10-CM | POA: Diagnosis not present

## 2021-01-04 DIAGNOSIS — R1310 Dysphagia, unspecified: Secondary | ICD-10-CM | POA: Diagnosis not present

## 2021-01-04 DIAGNOSIS — H9319 Tinnitus, unspecified ear: Secondary | ICD-10-CM

## 2021-01-04 DIAGNOSIS — R293 Abnormal posture: Secondary | ICD-10-CM | POA: Diagnosis not present

## 2021-01-04 DIAGNOSIS — Z95828 Presence of other vascular implants and grafts: Secondary | ICD-10-CM

## 2021-01-04 HISTORY — DX: Abnormal weight loss: R63.4

## 2021-01-04 LAB — BASIC METABOLIC PANEL - CANCER CENTER ONLY
Anion gap: 9 (ref 5–15)
BUN: 15 mg/dL (ref 6–20)
CO2: 28 mmol/L (ref 22–32)
Calcium: 9.5 mg/dL (ref 8.9–10.3)
Chloride: 101 mmol/L (ref 98–111)
Creatinine: 1.46 mg/dL — ABNORMAL HIGH (ref 0.61–1.24)
GFR, Estimated: 59 mL/min — ABNORMAL LOW (ref >60)
Glucose, Bld: 159 mg/dL — ABNORMAL HIGH (ref 70–99)
Potassium: 3.6 mmol/L (ref 3.5–5.1)
Sodium: 138 mmol/L (ref 135–145)

## 2021-01-04 LAB — CBC WITH DIFFERENTIAL (CANCER CENTER ONLY)
Abs Immature Granulocytes: 0.06 10*3/uL (ref 0.00–0.07)
Basophils Absolute: 0 10*3/uL (ref 0.0–0.1)
Basophils Relative: 0 %
Eosinophils Absolute: 0.3 10*3/uL (ref 0.0–0.5)
Eosinophils Relative: 3 %
HCT: 39.1 % (ref 39.0–52.0)
Hemoglobin: 13.7 g/dL (ref 13.0–17.0)
Immature Granulocytes: 1 %
Lymphocytes Relative: 11 %
Lymphs Abs: 1.1 10*3/uL (ref 0.7–4.0)
MCH: 32.9 pg (ref 26.0–34.0)
MCHC: 35 g/dL (ref 30.0–36.0)
MCV: 94 fL (ref 80.0–100.0)
Monocytes Absolute: 0.6 10*3/uL (ref 0.1–1.0)
Monocytes Relative: 6 %
Neutro Abs: 7.7 10*3/uL (ref 1.7–7.7)
Neutrophils Relative %: 79 %
Platelet Count: 240 10*3/uL (ref 150–400)
RBC: 4.16 MIL/uL — ABNORMAL LOW (ref 4.22–5.81)
RDW: 11.9 % (ref 11.5–15.5)
WBC Count: 9.8 10*3/uL (ref 4.0–10.5)
nRBC: 0 % (ref 0.0–0.2)

## 2021-01-04 LAB — MAGNESIUM: Magnesium: 3 mg/dL — ABNORMAL HIGH (ref 1.7–2.4)

## 2021-01-04 MED ORDER — HEPARIN SOD (PORK) LOCK FLUSH 100 UNIT/ML IV SOLN
500.0000 [IU] | INTRAVENOUS | Status: AC | PRN
Start: 2021-01-04 — End: 2021-01-04
  Administered 2021-01-04: 500 [IU]
  Filled 2021-01-04: qty 5

## 2021-01-04 MED ORDER — SODIUM CHLORIDE 0.9% FLUSH
10.0000 mL | INTRAVENOUS | Status: AC | PRN
  Administered 2021-01-04: 10 mL
  Filled 2021-01-04: qty 10

## 2021-01-04 NOTE — Assessment & Plan Note (Signed)
He has noted some tinnitus in the past 1 week since he started the chemotherapy.  It does not bother him however it is persistent.  He did not notice any hearing loss.  We have discussed that tinnitus has been reported in several patients with cisplatin, incidence is usually 5 to 30% and is usually reported after receiving a dose of 100 mg per metered square of cisplatin.  We also discussed that high-frequency hearing loss has been reported with cisplatin.  He understands that some of the side effects can be long-lasting and permanent.  I do not believe we have to do any dose modification for this symptom at this time.

## 2021-01-04 NOTE — Assessment & Plan Note (Signed)
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 He is status post cycle 1 day 1 of weekly cisplatin on 12/27/2020. Interim review of systems concerning for dysgeusia, tinnitus, soreness around the G-tube and some weight loss. Physical examination today, no major concern for mucositis, G-tube appears well without any concern for infection. His labs did show a drop in GFR to 59 mL/min, patient would like to repeat BMP tomorrow morning since he would like to proceed with full dose if possible.  He will hydrate well today.  This is reasonable, if the GFR is about 60 mL/min tomorrow, we can continue current dose.  If not we will reduce it to 30 mg per metered square.

## 2021-01-04 NOTE — Progress Notes (Signed)
**Gerald [name]** Gerald Gerald [name]  Patient Care Team: Pa, Alafaya as PCP - General (Family Medicine)  CHIEF COMPLAINTS/PURPOSE OF CONSULTATION:  HPV positive SCC oropharynx.  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 He is status post cycle 1 day 1 of weekly cisplatin on 12/27/2020. Interim review of systems concerning for dysgeusia, tinnitus, soreness around the G-tube and some weight loss. Physical examination today, no major concern for mucositis, G-tube appears well without any concern for infection. His labs did show a drop in GFR to 59 mL/min, patient would like to repeat BMP tomorrow morning since he would like to proceed with full dose if possible.  He will hydrate well today.  This is reasonable, if the GFR is about 60 mL/min tomorrow, we can continue current dose.  If not we will reduce it to 30 mg per metered square.  Neoplasm related pain He has mild pain in his throat however most pain is noted around the G-tube.  He is tummy sleeper and he says that the G-tube bothers him the most.  He has been doing well with his pain medication he takes 1 to 2 tablets a day for management of pain.  Referred pain to the jaw has improved since initiation of treatment.  He recently had a refill on his pain medication.  If he continues to have more pain medication needs, we can certainly consider adding a long-acting pain medication in the future.  Weight loss, unintentional He lost about 10 pounds of weight in the past 1 week.  He is eating about 2000 to 2500 cal a day.  He has not been using his G-tube at all.  I recommended that he try 2 boosts a day in addition to his oral intake and not hesitate to use  the G-tube if he continues to drop weight at the same pace.  He expressed understanding.  Tinnitus He has noted some tinnitus in the past 1 week since he started the chemotherapy.  It does not bother him however it is persistent.  He did not notice any hearing loss.  We have discussed that tinnitus has been reported in several patients with cisplatin, incidence is usually 5 to 30% and is usually reported after receiving a dose of 100 mg per metered square of cisplatin.  We also discussed that high-frequency hearing loss has been reported with cisplatin.  He understands that some of the side effects can be long-lasting and permanent.  I do not believe we have to do any dose modification for this symptom at this time.  Orders Placed This Encounter  Procedures   Basic Metabolic Panel - [place] Only    Standing Status:   Future    Standing Expiration Date:   01/04/2022     HISTORY OF PRESENTING ILLNESS:  Gerald Gerald [name] 50 y.o. male is here because of left tonsillar cancer, HPV positive.  Oncology History Overview Gerald [name]  50 yr old male who has experiencing chronic sore throat for the past few months. He underwent a CT neck which showed primarily a submucosal 3 cm mass within the left tonsillar fossa also noted to have 3.2 cm left jugulodigastric lymph node, findings were concerning for left tonsillar malignancy and malignant lymph node. He underwent left  tonsillectomy by Dr. [name] and pathology showed invasive squamous cell carcinoma, HPV mediated.  He He had PET imaging which showed evidence of left tonsillar cancer and left ipsilateral lymph nodes, from my review, no evidence of distant metastasis, I could not see an official report on the PET imaging. He is here for initial recommendations.   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[facility])  12/06/2020 Initial Diagnosis   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[place])   12/06/2020 Cancer Staging   Staging form: Pharynx - HPV-Mediated Oropharynx,  AJCC 8th Edition - Clinical stage from 12/06/2020: Stage I (cT2, cN1, cM0, p16+) - Signed by Benay Pike, MD on 12/06/2020  Stage prefix: Initial diagnosis    12/27/2020 -  Chemotherapy    Patient is on Treatment Plan: HEAD/NECK CISPLATIN Q7D       Cancer of tonsillar fossa ([place])  12/07/2020 Initial Diagnosis   Cancer of tonsillar fossa ([place])    12/07/2020 Cancer Staging   Staging form: Pharynx - HPV-Mediated Oropharynx, AJCC 8th Edition - Clinical stage from 12/07/2020: Stage I (cT2, cN1, cM0, p16+) - Signed by Eppie Gibson, MD on 12/07/2020  Stage prefix: Initial diagnosis     Interval History  He is currently on concurrent CRT. He had his first weekly cycle of cisplatin on 12/27/2020.  He is here for a follow-up before planned second cycle of chemotherapy.  Since his last visit, he has noticed some tinnitus, its not very bothersome but it lasts pretty much all day.  He feels tired but he has been able to eat well and working on drinking enough water.  He has some nausea and takes as needed nausea medications.  Dysgeusia, food does not taste good but he continues to push himself to eat.  He has lost about 10 pounds of weight in the past week, he believes this is likely from some fluid changes because he was peeing every 2 hours while he was on dexamethasone.  No hearing loss reported.  No change in breathing.  G-tube remains sore and he continues to take about 2 to 3 tablets of oxycodone a day for pain around the G-tube as well as in his throat.  No change in bowel habits.  Rest of the pertinent 10 point ROS reviewed and negative.  MEDICAL HISTORY:  Past Medical History:  Diagnosis Date   Sleep apnea    Tonsillar mass 11/16/2020    SURGICAL HISTORY: Past Surgical History:  Procedure Laterality Date   DIRECT LARYNGOSCOPY N/A 11/22/2020   Procedure: DIRECT LARYNGOSCOPY WITH BIOPSY;  Surgeon: Leta Baptist, MD;  Location: [facility];  Service: ENT;  Laterality: N/A;   FOOT  SURGERY  2000   extra nivicular removal   IR GASTROSTOMY TUBE MOD SED  12/24/2020   IR IMAGING GUIDED PORT INSERTION  12/24/2020   SPINE SURGERY     TONSILLECTOMY Left 11/22/2020   Procedure: LEFT TONSILLECTOMY;  Surgeon: Leta Baptist, MD;  Location: [facility];  Service: ENT;  Laterality: Left;    SOCIAL HISTORY: Social History   Socioeconomic History   Marital status: Married    Spouse name: Not on file   Number of children: Not on file   Years of education: Not on file   Highest education level: Not on file  Occupational History   Not on file  Tobacco Use   Smoking status: Never   Smokeless tobacco: Never  Vaping Use   Vaping Use: Never used  Substance and Sexual Activity   Alcohol  use: Not Currently    Comment: rare   Drug use: Never   Sexual activity: Not Currently  Other Topics Concern   Not on file  Social History Narrative   Not on file   Social Determinants of Health   Financial Resource Strain: Low Risk    Difficulty of Paying Living Expenses: Not hard at all  Food Insecurity: No Food Insecurity   Worried About Charity fundraiser in the Last Year: Never true   [place] in the Last Year: Never true  Transportation Needs: No Transportation Needs   Lack of Transportation (Medical): No   Lack of Transportation (Non-Medical): No  Physical Activity: Not on file  Stress: No Stress Concern Present   Feeling of Stress : Only a little  Social Connections: Engineer, building services of Communication with Friends and Family: More than three times a week   Frequency of Social Gatherings with Friends and Family: Three times a week   Attends Religious Services: 1 to 4 times per year   Active Member of Clubs or Organizations: Yes   Attends Archivist Meetings: 1 to 4 times per year   Marital Status: Married  Human resources officer Violence: Not At Risk   Fear of Current or Ex-Partner: No   Emotionally Abused: No   Physically Abused: No    Sexually Abused: No    FAMILY HISTORY: No family history on file.  ALLERGIES:  has No Known Allergies.  MEDICATIONS:  Current Outpatient Medications  Medication Sig Dispense Refill   dexamethasone (DECADRON) 4 MG tablet Take 2 tablets (8 mg total) by mouth daily. Take daily x 3 days starting the day after cisplatin chemotherapy. Take with food. 30 tablet 1   lidocaine-prilocaine (EMLA) cream Apply to affected area once 30 g 3   Multiple Vitamins-Minerals (MULTIVITAMIN WITH MINERALS) tablet Take 1 tablet by mouth daily.     ondansetron (ZOFRAN) 8 MG tablet Take 1 tablet (8 mg total) by mouth 2 (two) times daily as needed. Start on the third day after cisplatin chemotherapy. 30 tablet 1   oxyCODONE-acetaminophen (PERCOCET) 7.5-325 MG tablet Take 1 tablet by mouth every 6 (six) hours as needed for severe pain. 60 tablet 0   prochlorperazine (COMPAZINE) 10 MG tablet Take 1 tablet (10 mg total) by mouth every 6 (six) hours as needed (Nausea or vomiting). 30 tablet 1   No current facility-administered medications for this visit.    PHYSICAL EXAMINATION: ECOG PERFORMANCE STATUS: 1 - Symptomatic but completely ambulatory  Vitals:   01/04/21 0920  BP: 124/86  Pulse: 75  Resp: 18  Temp: 97.9 F (36.6 C)  SpO2: 96%   Filed Weights   01/04/21 0920  Weight: 243 lb 3.2 oz (110.3 kg)    Physical Exam Constitutional:      Appearance: Normal appearance.  HENT:     Head: Normocephalic and atraumatic.  Cardiovascular:     Rate and Rhythm: Normal rate and regular rhythm.     Pulses: Normal pulses.     Heart sounds: Normal heart sounds.  Pulmonary:     Effort: Pulmonary effort is normal.     Breath sounds: Normal breath sounds.  Abdominal:     General: Abdomen is flat. Bowel sounds are normal.     Palpations: Abdomen is soft.     Comments: G tube site appears well  Musculoskeletal:        General: No swelling, tenderness, deformity or signs of injury.  Cervical back: Normal  range of motion and neck supple. No rigidity.  Lymphadenopathy:     Cervical: No cervical adenopathy.  Skin:    General: Skin is warm and dry.  Neurological:     General: No focal deficit present.     Mental Status: He is alert and oriented to person, place, and time.  Psychiatric:        Mood and Affect: Mood normal.        Behavior: Behavior normal.    LABORATORY DATA:  I have reviewed the data as listed Lab Results  Component Value Date   WBC 9.8 01/04/2021   HGB 13.7 01/04/2021   HCT 39.1 01/04/2021   MCV 94.0 01/04/2021   PLT 240 01/04/2021     Chemistry      Component Value Date/Time   NA 138 01/04/2021 0903   K 3.6 01/04/2021 0903   CL 101 01/04/2021 0903   CO2 28 01/04/2021 0903   BUN 15 01/04/2021 0903   CREATININE 1.46 (H) 01/04/2021 0903      Component Value Date/Time   CALCIUM 9.5 01/04/2021 0903       RADIOGRAPHIC STUDIES: I have personally reviewed the radiological images as listed and agreed with the findings in the report. IR Gastrostomy Tube  Result Date: 12/24/2020 CLINICAL DATA:  Tonsillar carcinoma, needs enteral feeding support EXAM: PERC PLACEMENT GASTROSTOMY FLUOROSCOPY TIME:  2 minutes 24 seconds; 58 mGy TECHNIQUE: The procedure, risks, benefits, and alternatives were explained to the patient. Questions regarding the procedure were encouraged and answered. The patient understands and consents to the procedure. As antibiotic prophylaxis, cefazolin 2 g was ordered pre-procedure and administered intravenously within one hour of incision.Progression of previously administered oral barium into the colon was confirmed fluoroscopically. A 5 French angiographic catheter was placed as orogastric tube. The upper abdomen was prepped with Betadine, draped in usual sterile fashion, and infiltrated locally with 1% lidocaine. Intravenous Fentanyl 130mcg and Versed 4mg  were administered as conscious sedation during continuous monitoring of the patient's level of  consciousness and physiological / cardiorespiratory status by the radiology RN, with a total moderate sedation time of 71 minutes. Stomach was insufflated using air through the orogastric tube. An 14 French sheath needle was advanced percutaneously into the gastric lumen under fluoroscopy. Gas could be aspirated and a small contrast injection confirmed intraluminal spread. The sheath was exchanged over a guidewire for a 9 [place] vascular sheath, through which the snare device was advanced and used to snare a guidewire passed through the orogastric tube. This was withdrawn, and the snare attached to the 20 French pull-through gastrostomy tube, which was advanced antegrade, positioned with the internal bumper securing the anterior gastric wall to the anterior abdominal wall. Small contrast injection confirms appropriate positioning. The external bumper was applied and the catheter was flushed. COMPLICATIONS: COMPLICATIONS none IMPRESSION: 1. Technically successful 20 French pull-through gastrostomy placement under fluoroscopy. Electronically Signed   By: Lucrezia Europe M.D.   On: 12/24/2020 13:30   IR IMAGING GUIDED PORT INSERTION  Result Date: 12/24/2020 CLINICAL DATA:  Left tonsillar carcinoma EXAM: TUNNELED PORT CATHETER PLACEMENT WITH ULTRASOUND AND FLUOROSCOPIC GUIDANCE FLUOROSCOPY TIME:  6 seconds; 1 mGy ANESTHESIA/SEDATION: Intravenous Fentanyl 161mcg and Versed 2mg  were administered as conscious sedation during continuous monitoring of the patient's level of consciousness and physiological / cardiorespiratory status by the radiology RN, with a total moderate sedation time of 20 minutes. TECHNIQUE: The procedure, risks, benefits, and alternatives were explained to the patient. Questions regarding the  procedure were encouraged and answered. The patient understands and consents to the procedure. Patency of the right IJ vein was confirmed with ultrasound with image documentation. An appropriate skin site was  determined. Skin site was marked. Region was prepped using maximum barrier technique including cap and mask, sterile gown, sterile gloves, large sterile sheet, and Chlorhexidine as cutaneous antisepsis. The region was infiltrated locally with 1% lidocaine. Under real-time ultrasound guidance, the right IJ vein was accessed with a 21 gauge micropuncture needle; the needle tip within the vein was confirmed with ultrasound image documentation. Needle was exchanged over a 018 guidewire for transitional dilator, and vascular measurement was performed. A small incision was made on the right anterior chest wall and a subcutaneous pocket fashioned. The power-injectable port was positioned and its catheter tunneled to the right IJ dermatotomy site. The transitional dilator was exchanged over an Amplatz wire for a peel-away sheath, through which the port catheter, which had been trimmed to the appropriate length, was advanced and positioned under fluoroscopy with its tip at the cavoatrial junction. Spot chest radiograph confirms good catheter position and no pneumothorax. The port was flushed per protocol. The pocket was closed with deep interrupted and subcuticular continuous 3-0 Monocryl sutures. The incisions were covered with Dermabond then covered with a sterile dressing. The patient tolerated the procedure well. COMPLICATIONS: COMPLICATIONS None immediate IMPRESSION: Technically successful right IJ power-injectable port catheter placement. Ready for routine use. Electronically Signed   By: Lucrezia Europe M.D.   On: 12/24/2020 10:49     All questions were answered. The patient knows to call the clinic with any problems, questions or concerns. I spent 30 minutes in the care of this patient, including H and P, review of records, counseling about pain management primarily. We have reviewed about tinnitus and high-frequency hearing loss noted with cisplatin, need for more hydration given the drop in GFR, management of pain,  nutrition and weight monitoring.    Benay Pike, MD 01/04/2021 10:44 AM

## 2021-01-04 NOTE — Assessment & Plan Note (Addendum)
He has mild pain in his throat however most pain is noted around the G-tube.  He is tummy sleeper and he says that the G-tube bothers him the most.  He has been doing well with his pain medication he takes 1 to 2 tablets a day for management of pain.  Referred pain to the jaw has improved since initiation of treatment.  He recently had a refill on his pain medication.  If he continues to have more pain medication needs, we can certainly consider adding a long-acting pain medication in the future.

## 2021-01-04 NOTE — Assessment & Plan Note (Signed)
He lost about 10 pounds of weight in the past 1 week.  He is eating about 2000 to 2500 cal a day.  He has not been using his G-tube at all.  I recommended that he try 2 boosts a day in addition to his oral intake and not hesitate to use the G-tube if he continues to drop weight at the same pace.  He expressed understanding.

## 2021-01-04 NOTE — Telephone Encounter (Signed)
Scheduled appointment per 06/14 sch msg. Patient will receive updated calender.

## 2021-01-05 ENCOUNTER — Other Ambulatory Visit: Payer: Self-pay | Admitting: Hematology and Oncology

## 2021-01-05 ENCOUNTER — Ambulatory Visit
Admission: RE | Admit: 2021-01-05 | Discharge: 2021-01-05 | Disposition: A | Discharge status: Home / Self Care | Payer: Managed Care, Other (non HMO) | Source: Ambulatory Visit | Attending: Radiation Oncology | Admitting: Radiation Oncology

## 2021-01-05 ENCOUNTER — Inpatient Hospital Stay: Payer: Managed Care, Other (non HMO) | Admitting: Nutrition

## 2021-01-05 ENCOUNTER — Inpatient Hospital Stay: Payer: Managed Care, Other (non HMO)

## 2021-01-05 ENCOUNTER — Other Ambulatory Visit: Payer: Self-pay

## 2021-01-05 ENCOUNTER — Other Ambulatory Visit: Payer: Self-pay | Admitting: Nutrition

## 2021-01-05 VITALS — Wt 244.0 lb

## 2021-01-05 DIAGNOSIS — C109 Malignant neoplasm of oropharynx, unspecified: Secondary | ICD-10-CM

## 2021-01-05 DIAGNOSIS — R293 Abnormal posture: Secondary | ICD-10-CM | POA: Diagnosis not present

## 2021-01-05 DIAGNOSIS — R1310 Dysphagia, unspecified: Secondary | ICD-10-CM | POA: Diagnosis not present

## 2021-01-05 LAB — BASIC METABOLIC PANEL - CANCER CENTER ONLY
Anion gap: 10 (ref 5–15)
BUN: 14 mg/dL (ref 6–20)
CO2: 27 mmol/L (ref 22–32)
Calcium: 9.4 mg/dL (ref 8.9–10.3)
Chloride: 101 mmol/L (ref 98–111)
Creatinine: 1.16 mg/dL (ref 0.61–1.24)
GFR, Estimated: >60 mL/min (ref >60)
Glucose, Bld: 104 mg/dL — ABNORMAL HIGH (ref 70–99)
Potassium: 3.7 mmol/L (ref 3.5–5.1)
Sodium: 138 mmol/L (ref 135–145)

## 2021-01-05 LAB — CBC WITH DIFFERENTIAL (CANCER CENTER ONLY)
Abs Immature Granulocytes: 0.05 10*3/uL (ref 0.00–0.07)
Basophils Absolute: 0 10*3/uL (ref 0.0–0.1)
Basophils Relative: 0 %
Eosinophils Absolute: 0.1 10*3/uL (ref 0.0–0.5)
Eosinophils Relative: 2 %
HCT: 35.7 % — ABNORMAL LOW (ref 39.0–52.0)
Hemoglobin: 12.4 g/dL — ABNORMAL LOW (ref 13.0–17.0)
Immature Granulocytes: 1 %
Lymphocytes Relative: 16 %
Lymphs Abs: 1.2 10*3/uL (ref 0.7–4.0)
MCH: 32.8 pg (ref 26.0–34.0)
MCHC: 34.7 g/dL (ref 30.0–36.0)
MCV: 94.4 fL (ref 80.0–100.0)
Monocytes Absolute: 0.7 10*3/uL (ref 0.1–1.0)
Monocytes Relative: 9 %
Neutro Abs: 5.5 10*3/uL (ref 1.7–7.7)
Neutrophils Relative %: 72 %
Platelet Count: 213 10*3/uL (ref 150–400)
RBC: 3.78 MIL/uL — ABNORMAL LOW (ref 4.22–5.81)
RDW: 11.9 % (ref 11.5–15.5)
WBC Count: 7.6 10*3/uL (ref 4.0–10.5)
nRBC: 0 % (ref 0.0–0.2)

## 2021-01-05 LAB — MAGNESIUM: Magnesium: 1.8 mg/dL (ref 1.7–2.4)

## 2021-01-05 MED ORDER — OXYCODONE HCL 5 MG PO TABS
ORAL_TABLET | ORAL | Status: AC
  Filled 2021-01-05: qty 1

## 2021-01-05 MED ORDER — SODIUM CHLORIDE 0.9 % IV SOLN
150.0000 mg | Freq: Once | INTRAVENOUS | Status: AC
  Administered 2021-01-05: 150 mg via INTRAVENOUS
  Filled 2021-01-05: qty 150

## 2021-01-05 MED ORDER — OXYCODONE HCL 5 MG PO TABS
5.0000 mg | ORAL_TABLET | Freq: Once | ORAL | Status: AC
  Administered 2021-01-05: 5 mg via ORAL

## 2021-01-05 MED ORDER — MAGNESIUM SULFATE 2 GM/50ML IV SOLN
INTRAVENOUS | Status: AC
  Filled 2021-01-05: qty 50

## 2021-01-05 MED ORDER — OSMOLITE 1.5 CAL PO LIQD: ORAL | 6 refills | Status: DC

## 2021-01-05 MED ORDER — ACETAMINOPHEN 325 MG PO TABS
325.0000 mg | ORAL_TABLET | Freq: Once | ORAL | Status: AC
Start: 2021-01-05 — End: 2021-01-05
  Administered 2021-01-05: 325 mg via ORAL

## 2021-01-05 MED ORDER — SODIUM CHLORIDE 0.9% FLUSH
10.0000 mL | INTRAVENOUS | Status: DC | PRN
  Administered 2021-01-05 (×2): 10 mL
  Filled 2021-01-05: qty 10

## 2021-01-05 MED ORDER — SODIUM CHLORIDE 0.9 % IV SOLN
Freq: Once | INTRAVENOUS | Status: AC
  Filled 2021-01-05: qty 250

## 2021-01-05 MED ORDER — POTASSIUM CHLORIDE IN NACL 20-0.9 MEQ/L-% IV SOLN
Freq: Once | INTRAVENOUS | Status: AC
  Filled 2021-01-05: qty 1000

## 2021-01-05 MED ORDER — SODIUM CHLORIDE 0.9 % IV SOLN
10.0000 mg | Freq: Once | INTRAVENOUS | Status: AC
  Administered 2021-01-05: 10 mg via INTRAVENOUS
  Filled 2021-01-05: qty 10

## 2021-01-05 MED ORDER — MAGNESIUM SULFATE 2 GM/50ML IV SOLN
2.0000 g | Freq: Once | INTRAVENOUS | Status: AC
  Administered 2021-01-05: 2 g via INTRAVENOUS

## 2021-01-05 MED ORDER — SODIUM CHLORIDE 0.9 % IV SOLN
40.0000 mg/m2 | Freq: Once | INTRAVENOUS | Status: AC
  Administered 2021-01-05: 94 mg via INTRAVENOUS
  Filled 2021-01-05: qty 94

## 2021-01-05 MED ORDER — SODIUM CHLORIDE 0.9% FLUSH
10.0000 mL | Freq: Once | INTRAVENOUS | Status: AC | PRN
  Administered 2021-01-05: 10 mL
  Filled 2021-01-05: qty 10

## 2021-01-05 MED ORDER — ACETAMINOPHEN 325 MG PO TABS
ORAL_TABLET | ORAL | Status: AC
  Filled 2021-01-05: qty 1

## 2021-01-05 MED ORDER — PALONOSETRON HCL INJECTION 0.25 MG/5ML
INTRAVENOUS | Status: AC
  Filled 2021-01-05: qty 5

## 2021-01-05 MED ORDER — PALONOSETRON HCL INJECTION 0.25 MG/5ML
0.2500 mg | Freq: Once | INTRAVENOUS | Status: AC
  Administered 2021-01-05: 0.25 mg via INTRAVENOUS

## 2021-01-05 MED ORDER — HEPARIN SOD (PORK) LOCK FLUSH 100 UNIT/ML IV SOLN
500.0000 [IU] | Freq: Once | INTRAVENOUS | Status: AC | PRN
  Administered 2021-01-05: 500 [IU]
  Filled 2021-01-05: qty 5

## 2021-01-05 NOTE — Progress Notes (Signed)
50 year old male diagnosed with tonsil cancer receiving concurrent chemoradiation therapy with cisplatin.  He is followed by Dr. Chryl Heck and Dr. Isidore Moos.  Today is cycle 2 of chemotherapy.    Past medical history includes sleep apnea.  Medications include multivitamin, Decadron, Zofran, and Compazine.  Labs include glucose 104, GFR greater than 60, magnesium 1.8.  Height: 5 feet 10 inches. Weight: 243.2 pounds Usual body weight: 268 pounds Nov 22, 2020. BMI: 34.9.  Patient is status post PEG placement.  He continues to have pain around his PEG site.  He reports increased fatigue and dysgeusia.  He complains of increased gas production.  He does have nausea but states it is controlled with medications.  He also leans towards constipation which is also controlled with medications.  Reports he does not tolerate any fried food anymore.  He seems to be able to taste sweets pretty well and has tolerated chicken nuggets.  He has tried Ensure Plus and is okay with consuming 1 daily.  He verbalizes desire for weight maintenance.  He understands importance of increased fluids.  He has had no trouble flushing his feeding tube with water and flushes 1-2 times a day with approximately 240 mL each time.  Estimated nutrition needs: 2600-2800 cal, 130-145 g protein, 2.8 L fluid.  Nutrition diagnosis: Predicted sub optimal energy intake related to tonsil cancer and associated treatments as evidenced by history or condition for which research shows an increased incidence of sub optimal energy intake  Intervention: Educated patient on importance of increasing calories and protein in small frequent meals/snacks daily.  Reviewed soft, high-protein foods. Brief education on managing taste alterations and thick saliva. Recommended nausea medication to prevent nausea. Continue bowel regiment with increased fluids. Drink 1 Ensure Plus a day. Use 1 carton of Osmolite 1.5 with 60 mL of free water before and after  daily. Nutrition facts sheets provided.  Questions were answered.  Teach back method used.  Contact information given.  Tube feeding goal: 7 cartons Osmolite 1.5+ split into 4 feedings plus 45 mL Prosource TF 3 times daily daily.  Give 60 mL free water flush before and after bolus feedings.  Give 30 mL flush before and after Prosource.  Add additional 240 mL free water 3 times a day. Provides 2620 cal, 137.3 g protein, 2647 mL free water/100% estimated needs.  Monitoring, evaluation, goals: Patient will tolerate increased calories and protein to minimize weight loss.  Next visit: Tuesday, June 21 during infusion.  **Disclaimer: This note was dictated with voice recognition software. Similar sounding words can inadvertently be transcribed and this note may contain transcription errors which may not have been corrected upon publication of note.**

## 2021-01-05 NOTE — Progress Notes (Signed)
During patient's visit, he reported pain in his throat and that he forgot to bring his pain medication. Dr. Chryl Heck notified, pain medication given.

## 2021-01-05 NOTE — Patient Instructions (Signed)
[facility]    Discharge Instructions: Thank you for choosing [facility] to provide your oncology and hematology care.   If you have a lab appointment with the [facility], please go directly to the [facility] and check in at the registration area.   Wear comfortable clothing and clothing appropriate for easy access to any Portacath or PICC line.   We strive to give you quality time with your provider. You may need to reschedule your appointment if you arrive late (15 or more minutes).  Arriving late affects you and other patients whose appointments are after yours.  Also, if you miss three or more appointments without notifying the office, you may be dismissed from the clinic at the provider's discretion.      For prescription refill requests, have your pharmacy contact our office and allow 72 hours for refills to be completed.    Today you received the following chemotherapy and/or immunotherapy agents: cisplatin.      To help prevent nausea and vomiting after your treatment, we encourage you to take your nausea medication as directed.  BELOW ARE SYMPTOMS THAT SHOULD BE REPORTED IMMEDIATELY: *FEVER GREATER THAN 100.4 F (38 C) OR HIGHER *CHILLS OR SWEATING *NAUSEA AND VOMITING THAT IS NOT CONTROLLED WITH YOUR NAUSEA MEDICATION *UNUSUAL SHORTNESS OF BREATH *UNUSUAL BRUISING OR BLEEDING *URINARY PROBLEMS (pain or burning when urinating, or frequent urination) *BOWEL PROBLEMS (unusual diarrhea, constipation, pain near the anus) TENDERNESS IN MOUTH AND THROAT WITH OR WITHOUT PRESENCE OF ULCERS (sore throat, sores in mouth, or a toothache) UNUSUAL RASH, SWELLING OR PAIN  UNUSUAL VAGINAL DISCHARGE OR ITCHING   Items with * indicate a potential emergency and should be followed up as soon as possible or go to the Emergency Department if any problems should occur.  Please show the CHEMOTHERAPY ALERT CARD or IMMUNOTHERAPY ALERT CARD at  check-in to the Emergency Department and triage nurse.  Should you have questions after your visit or need to cancel or reschedule your appointment, please contact [facility]  Dept: [phone]  and follow the prompts.  Office hours are 8:00 a.m. to 4:30 p.m. Monday - Friday. Please note that voicemails left after 4:00 p.m. may not be returned until the following business day.  We are closed weekends and major holidays. You have access to a nurse at all times for urgent questions. Please call the main number to the clinic Dept: [phone] and follow the prompts.   For any non-urgent questions, you may also contact your provider using MyChart. We now offer e-Visits for anyone 78 and older to request care online for non-urgent symptoms. For details visit [email or web address].   Also download the MyChart app! Go to the app store, search "MyChart", open the app, select [facility], and log in with your MyChart username and password.  Due to Covid, a mask is required upon entering the hospital/clinic. If you do not have a mask, one will be given to you upon arrival. For doctor visits, patients may have 1 support person aged 3 or older with them. For treatment visits, patients cannot have anyone with them due to current Covid guidelines and our immunocompromised population.   Cisplatin injection What is this medicine? CISPLATIN (SIS pla tin) is a chemotherapy drug. It targets fast dividing cells, like cancer cells, and causes these cells to die. This medicine is used to treat many types of cancer like bladder, ovarian, and testicular cancers. This medicine may  be used for other purposes; ask your health care provider or pharmacist if you have questions. COMMON BRAND NAME(S): Platinol, Platinol -AQ What should I tell my health care provider before I take this medicine? They need to know if you have any of these conditions: eye disease, vision problems hearing  problems kidney disease low blood counts, like white cells, platelets, or red blood cells tingling of the fingers or toes, or other nerve disorder an unusual or allergic reaction to cisplatin, carboplatin, oxaliplatin, other medicines, foods, dyes, or preservatives pregnant or trying to get pregnant breast-feeding How should I use this medicine? This drug is given as an infusion into a vein. It is administered in a hospital or clinic by a specially trained health care professional. Talk to your pediatrician regarding the use of this medicine in children. Special care may be needed. Overdosage: If you think you have taken too much of this medicine contact a poison control center or emergency room at once. NOTE: This medicine is only for you. Do not share this medicine with others. What if I miss a dose? It is important not to miss a dose. Call your doctor or health care professional if you are unable to keep an appointment. What may interact with this medicine? This medicine may interact with the following medications: foscarnet certain antibiotics like amikacin, gentamicin, neomycin, polymyxin B, streptomycin, tobramycin, vancomycin This list may not describe all possible interactions. Give your health care provider a list of all the medicines, herbs, non-prescription drugs, or dietary supplements you use. Also tell them if you smoke, drink alcohol, or use illegal drugs. Some items may interact with your medicine. What should I watch for while using this medicine? Your condition will be monitored carefully while you are receiving this medicine. You will need important blood work done while you are taking this medicine. This drug may make you feel generally unwell. This is not uncommon, as chemotherapy can affect healthy cells as well as cancer cells. Report any side effects. Continue your course of treatment even though you feel ill unless your doctor tells you to stop. This medicine may  increase your risk of getting an infection. Call your healthcare professional for advice if you get a fever, chills, or sore throat, or other symptoms of a cold or flu. Do not treat yourself. Try to avoid being around people who are sick. Avoid taking medicines that contain aspirin, acetaminophen, ibuprofen, naproxen, or ketoprofen unless instructed by your healthcare professional. These medicines may hide a fever. This medicine may increase your risk to bruise or bleed. Call your doctor or health care professional if you notice any unusual bleeding. Be careful brushing and flossing your teeth or using a toothpick because you may get an infection or bleed more easily. If you have any dental work done, tell your dentist you are receiving this medicine. Do not become pregnant while taking this medicine or for 14 months after stopping it. Women should inform their healthcare professional if they wish to become pregnant or think they might be pregnant. Men should not father a child while taking this medicine and for 11 months after stopping it. There is potential for serious side effects to an unborn child. Talk to your healthcare professional for more information. Do not breast-feed an infant while taking this medicine. This medicine has caused ovarian failure in some women. This medicine may make it more difficult to get pregnant. Talk to your healthcare professional if you are concerned about your fertility. This  medicine has caused decreased sperm counts in some men. This may make it more difficult to father a child. Talk to your healthcare professional if you are concerned about your fertility. Drink fluids as directed while you are taking this medicine. This will help protect your kidneys. Call your doctor or health care professional if you get diarrhea. Do not treat yourself. What side effects may I notice from receiving this medicine? Side effects that you should report to your doctor or health care  professional as soon as possible: allergic reactions like skin rash, itching or hives, swelling of the face, lips, or tongue blurred vision changes in vision decreased hearing or ringing of the ears nausea, vomiting pain, redness, or irritation at site where injected pain, tingling, numbness in the hands or feet signs and symptoms of bleeding such as bloody or black, tarry stools; red or dark brown urine; spitting up blood or brown material that looks like coffee grounds; red spots on the skin; unusual bruising or bleeding from the eyes, gums, or nose signs and symptoms of infection like fever; chills; cough; sore throat; pain or trouble passing urine signs and symptoms of kidney injury like trouble passing urine or change in the amount of urine signs and symptoms of low red blood cells or anemia such as unusually weak or tired; feeling faint or lightheaded; falls; breathing problems Side effects that usually do not require medical attention (report to your doctor or health care professional if they continue or are bothersome): loss of appetite mouth sores muscle cramps This list may not describe all possible side effects. Call your doctor for medical advice about side effects. You may report side effects to FDA at 1-800-FDA-1088. Where should I keep my medicine? This drug is given in a hospital or clinic and will not be stored at home. NOTE: This sheet is a summary. It may not cover all possible information. If you have questions about this medicine, talk to your doctor, pharmacist, or health care provider.  2021 Elsevier/Gold Standard (2018-07-05 15:59:17)

## 2021-01-05 NOTE — Progress Notes (Signed)
Tube feeding orders written and [employer] notified.

## 2021-01-06 ENCOUNTER — Ambulatory Visit: Payer: Managed Care, Other (non HMO) | Attending: Radiation Oncology | Admitting: Physical Therapy

## 2021-01-06 ENCOUNTER — Ambulatory Visit
Admission: RE | Admit: 2021-01-06 | Discharge: 2021-01-06 | Disposition: A | Discharge status: Home / Self Care | Payer: Managed Care, Other (non HMO) | Source: Ambulatory Visit | Attending: Radiation Oncology | Admitting: Radiation Oncology

## 2021-01-06 ENCOUNTER — Encounter: Payer: Self-pay | Admitting: Physical Therapy

## 2021-01-06 DIAGNOSIS — R293 Abnormal posture: Secondary | ICD-10-CM

## 2021-01-06 DIAGNOSIS — C090 Malignant neoplasm of tonsillar fossa: Secondary | ICD-10-CM | POA: Diagnosis present

## 2021-01-06 DIAGNOSIS — R1310 Dysphagia, unspecified: Secondary | ICD-10-CM | POA: Diagnosis not present

## 2021-01-06 NOTE — Progress Notes (Signed)
Oncology Nurse Navigator Documentation   Gerald [name] presents for Head and Neck MDC today. He saw [name] PT and also saw [name] SLP at his outpatient clinic last week. He continues to tolerate treatment. I answered his questions and he know that he can call me anytime with questions.  Harlow Asa RN, BSN, OCN Head & Neck Oncology Nurse [facility] Phone # [phone]  Fax # [phone]

## 2021-01-06 NOTE — Therapy (Signed)
[place], [place], [address] Phone: [phone]   Fax:  [phone]  Physical Therapy Evaluation  Patient Details  Name: Gerald [name] MRN: [id number] Date of Birth: 10-20-1970 Referring Provider (PT): Reita May Date: 01/06/2021   PT End of Session - 01/06/21 0842     Visit Number 1    Number of Visits 2    Date for PT Re-Evaluation 03/03/21    PT Start Time 0814    PT Stop Time 0841    PT Time Calculation (min) 27 min    Activity Tolerance Patient tolerated treatment well    Behavior During Therapy [facility] for tasks assessed/performed             Past Medical History:  Diagnosis Date   Sleep apnea    Tonsillar mass 11/16/2020    Past Surgical History:  Procedure Laterality Date   DIRECT LARYNGOSCOPY N/A 11/22/2020   Procedure: DIRECT LARYNGOSCOPY WITH BIOPSY;  Surgeon: Leta Baptist, MD;  Location: [facility];  Service: ENT;  Laterality: N/A;   FOOT SURGERY  2000   extra nivicular removal   IR GASTROSTOMY TUBE MOD SED  12/24/2020   IR IMAGING GUIDED PORT INSERTION  12/24/2020   SPINE SURGERY     TONSILLECTOMY Left 11/22/2020   Procedure: LEFT TONSILLECTOMY;  Surgeon: Leta Baptist, MD;  Location: [facility];  Service: ENT;  Laterality: Left;    There were no vitals filed for this visit.    Subjective Assessment - 01/06/21 0801     Subjective I am feeling good so far.    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 There was also noted to be distorted thyroid cartilage with overlapping left hyoid and thyroid cartilage, likely post-traumatic, 11/22/20 Biopsy revealed the Squamous Cell Carcinoma of left tonsil and left tongue base, 12/03/20 PET revealed a hypermetabolic lesion in the left tonsillar  fossa that corresponded to the abnormal findings on above CT scan. The lesion appeared confined to the mucosal/submucosal tissue. Additionally, there was a hypermetabolic ipsilateral metastatic level II lymph node. There was no evidence of contralateral adenopathy, nor was there any evidence of distant metastatic disease, consult for TORS with Dr. Nicolette Bang on 5/25 but decided to pursue chemo/radiation, will receive 35 fractions of radiation to his tonsil/base of tongue and bilateral neck and weekly cisplatin. He started on 12/27/20 and will complete 02/15/21.    Patient Stated Goals to gain info from provider    Currently in Pain? Yes    Pain Score 3     Pain Location Throat    Pain Orientation Left    Pain Descriptors / Indicators Discomfort    Pain Type Acute pain    Pain Onset More than a month ago    Pain Frequency Constant    Aggravating Factors  swallowing    Pain Relieving Factors pain meds    Effect of Pain on Daily Activities can't eat or drink                [facility] PT Assessment - 01/06/21 0001       Assessment   Medical Diagnosis L tonsillar cancer    Referring Provider (PT) Isidore Moos    Onset Date/Surgical Date 11/09/20    Hand Dominance Left  Prior Therapy none      Precautions   Precautions Other (comment)   L4?SI fusion   Precaution Comments active cancer      Restrictions   Weight Bearing Restrictions No      Balance Screen   Has the patient fallen in the past 6 months No    Has the patient had a decrease in activity level because of a fear of falling?  No    Is the patient reluctant to leave their home because of a fear of falling?  No      Home Ecologist residence    Living Arrangements Spouse/significant other    Available Help at Discharge Family    Type of [place]      Prior Function   Level of Independence Independent    Vocation On disability    Youth worker for [employer]     Leisure pt walks 2x/day for 1/2 mile      Cognition   Overall Cognitive Status Within Functional Limits for tasks assessed      Observation/Other Assessments   Observations some fullness on L side      Functional Tests   Functional tests Sit to Stand      Sit to Stand   Comments 30 sec sit to stand: 10 reps - pt reports he has knee pain      Posture/Postural Control   Posture/Postural Control Postural limitations    Postural Limitations Rounded Shoulders;Forward head      ROM / Strength   AROM / PROM / Strength AROM      AROM   Overall AROM Comments shoulder ROM WFL    AROM Assessment Site Cervical    Cervical Flexion WFL    Cervical Extension WFL    Cervical - Right Side Bend WFL    Cervical - Left Side Bend WFL    Cervical - Right Rotation WFL    Cervical - Left Rotation [facility]      Ambulation/Gait   Ambulation/Gait Yes    Ambulation/Gait Assistance 7: Independent    Gait Pattern Within Functional Limits               LYMPHEDEMA/ONCOLOGY QUESTIONNAIRE - 01/06/21 0001       Type   Cancer Type left tonsillar cancer      Lymphedema Assessments   Lymphedema Assessments Head and Neck      Head and Neck   4 cm superior to sternal notch around neck 43 cm    6 cm superior to sternal notch around neck 45 cm    8 cm superior to sternal notch around neck 47 cm                     Objective measurements completed on examination: See above findings.               PT Education - 01/06/21 0802     Education Details Neck ROM, importance of posture when sitting, standing and lying down, deep breathing, walking program and importance of staying active throughout treatment, CURE article on staying active, "Why exercise?" flyer, lymphedema and PT info    Person(s) Educated Patient    Methods Explanation;Handout    Comprehension Verbalized understanding                 PT Long Term Goals - 01/06/21 0848       PT LONG TERM GOAL #1  Title Pt will return to baseline cervical ROM and not demonstrate any signs or symptoms of lymphedema    Time 8    Period Weeks    Status New    Target Date 03/03/21                Head and Neck Clinic Goals - 01/06/21 0847       Patient will be able to verbalize understanding of a home exercise program for cervical range of motion, posture, and walking.          Time 1    Period Days    Status Achieved      Patient will be able to verbalize understanding of proper sitting and standing posture.          Time 1    Period Days    Status Achieved      Patient will be able to verbalize understanding of lymphedema risk and availability of treatment for this condition.          Time 1    Period Days    Status Achieved                Plan - 01/06/21 0843     Clinical Impression Statement Pt presents with recently diagnosed L tonsillar cancer. He underwent a L tonsillectomy and is currently receiving chemo and radiation and will complete at the end of July. His neck and shoulder ROM are WFL. He does neck stretches daily and back exercises daily since he has a fusion from L4 to S1. He does have some knee crepitus especially when climbing stairs per pt report and mild knee pain. Educated pt about importance of strengthening muscles surrounding the knee. Educated pt about signs and symptoms of lymphedema as well as anatomy and physiology of lymphatic system. Educated pt in importance of staying as active as possible throughout treatment to decrease fatigue as well as head and neck ROM exercises to decrease loss of ROM. Will see pt after completion of radiation to reassess ROM and assess for lymphedema to determine therapy needs at that time.    Stability/Clinical Decision Making Stable/Uncomplicated    Clinical Decision Making Low    Rehab Potential Good    PT Frequency --   eval and 1 f/u   PT Duration 8 weeks    PT Treatment/Interventions ADLs/Self Care Home  Management;Therapeutic exercise;Patient/family education    PT Next Visit Plan reassess baslines    PT Home Exercise Plan head and neck ROM exercises    Consulted and Agree with Plan of Care Patient             Patient will benefit from skilled therapeutic intervention in order to improve the following deficits and impairments:  Postural dysfunction, Pain, Decreased knowledge of precautions  Visit Diagnosis: Abnormal posture  Malignant neoplasm of tonsillar fossa [facility])     Problem List Patient Active Problem List   Diagnosis Date Noted   Weight loss, unintentional 01/04/2021   Tinnitus 01/04/2021   Cancer of tonsillar fossa ([place]) 12/07/2020   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

## 2021-01-07 ENCOUNTER — Ambulatory Visit
Admission: RE | Admit: 2021-01-07 | Discharge: 2021-01-07 | Disposition: A | Discharge status: Home / Self Care | Payer: Managed Care, Other (non HMO) | Source: Ambulatory Visit | Attending: Radiation Oncology | Admitting: Radiation Oncology

## 2021-01-07 DIAGNOSIS — R1310 Dysphagia, unspecified: Secondary | ICD-10-CM | POA: Diagnosis not present

## 2021-01-07 DIAGNOSIS — R293 Abnormal posture: Secondary | ICD-10-CM | POA: Diagnosis not present

## 2021-01-08 ENCOUNTER — Inpatient Hospital Stay: Payer: Managed Care, Other (non HMO)

## 2021-01-10 ENCOUNTER — Inpatient Hospital Stay: Payer: Managed Care, Other (non HMO)

## 2021-01-10 ENCOUNTER — Other Ambulatory Visit: Payer: Self-pay

## 2021-01-10 ENCOUNTER — Inpatient Hospital Stay (HOSPITAL_BASED_OUTPATIENT_CLINIC_OR_DEPARTMENT_OTHER): Payer: Managed Care, Other (non HMO) | Admitting: Hematology and Oncology

## 2021-01-10 ENCOUNTER — Other Ambulatory Visit: Payer: Self-pay | Admitting: Radiation Oncology

## 2021-01-10 ENCOUNTER — Encounter: Payer: Self-pay | Admitting: Hematology and Oncology

## 2021-01-10 ENCOUNTER — Ambulatory Visit
Admission: RE | Admit: 2021-01-10 | Discharge: 2021-01-10 | Disposition: A | Discharge status: Home / Self Care | Payer: Managed Care, Other (non HMO) | Source: Ambulatory Visit | Attending: Radiation Oncology | Admitting: Radiation Oncology

## 2021-01-10 DIAGNOSIS — H9319 Tinnitus, unspecified ear: Secondary | ICD-10-CM | POA: Diagnosis not present

## 2021-01-10 DIAGNOSIS — R634 Abnormal weight loss: Secondary | ICD-10-CM | POA: Diagnosis not present

## 2021-01-10 DIAGNOSIS — G893 Neoplasm related pain (acute) (chronic): Secondary | ICD-10-CM | POA: Diagnosis not present

## 2021-01-10 DIAGNOSIS — C099 Malignant neoplasm of tonsil, unspecified: Secondary | ICD-10-CM

## 2021-01-10 DIAGNOSIS — C109 Malignant neoplasm of oropharynx, unspecified: Secondary | ICD-10-CM

## 2021-01-10 DIAGNOSIS — R293 Abnormal posture: Secondary | ICD-10-CM | POA: Diagnosis not present

## 2021-01-10 DIAGNOSIS — R1310 Dysphagia, unspecified: Secondary | ICD-10-CM | POA: Diagnosis not present

## 2021-01-10 LAB — BASIC METABOLIC PANEL - CANCER CENTER ONLY
Anion gap: 5 (ref 5–15)
BUN: 19 mg/dL (ref 6–20)
CO2: 28 mmol/L (ref 22–32)
Calcium: 8.4 mg/dL — ABNORMAL LOW (ref 8.9–10.3)
Chloride: 102 mmol/L (ref 98–111)
Creatinine: 1.2 mg/dL (ref 0.61–1.24)
GFR, Estimated: >60 mL/min (ref >60)
Glucose, Bld: 95 mg/dL (ref 70–99)
Potassium: 3.5 mmol/L (ref 3.5–5.1)
Sodium: 135 mmol/L (ref 135–145)

## 2021-01-10 LAB — CBC WITH DIFFERENTIAL (CANCER CENTER ONLY)
Abs Immature Granulocytes: 0.02 10*3/uL (ref 0.00–0.07)
Basophils Absolute: 0 10*3/uL (ref 0.0–0.1)
Basophils Relative: 0 %
Eosinophils Absolute: 0.1 10*3/uL (ref 0.0–0.5)
Eosinophils Relative: 1 %
HCT: 34.3 % — ABNORMAL LOW (ref 39.0–52.0)
Hemoglobin: 11.9 g/dL — ABNORMAL LOW (ref 13.0–17.0)
Immature Granulocytes: 0 %
Lymphocytes Relative: 11 %
Lymphs Abs: 1 10*3/uL (ref 0.7–4.0)
MCH: 32.7 pg (ref 26.0–34.0)
MCHC: 34.7 g/dL (ref 30.0–36.0)
MCV: 94.2 fL (ref 80.0–100.0)
Monocytes Absolute: 0.5 10*3/uL (ref 0.1–1.0)
Monocytes Relative: 6 %
Neutro Abs: 6.8 10*3/uL (ref 1.7–7.7)
Neutrophils Relative %: 82 %
Platelet Count: 151 10*3/uL (ref 150–400)
RBC: 3.64 MIL/uL — ABNORMAL LOW (ref 4.22–5.81)
RDW: 11.9 % (ref 11.5–15.5)
WBC Count: 8.4 10*3/uL (ref 4.0–10.5)
nRBC: 0 % (ref 0.0–0.2)

## 2021-01-10 LAB — MAGNESIUM: Magnesium: 2.2 mg/dL (ref 1.7–2.4)

## 2021-01-10 MED ORDER — SODIUM CHLORIDE 0.9% FLUSH
10.0000 mL | Freq: Once | INTRAVENOUS | Status: AC | PRN
  Administered 2021-01-10: 10 mL
  Filled 2021-01-10: qty 10

## 2021-01-10 MED ORDER — HEPARIN SOD (PORK) LOCK FLUSH 100 UNIT/ML IV SOLN
500.0000 [IU] | Freq: Once | INTRAVENOUS | Status: AC | PRN
  Administered 2021-01-10: 500 [IU]
  Filled 2021-01-10: qty 5

## 2021-01-10 MED ORDER — LIDOCAINE VISCOUS HCL 2 % MT SOLN: OROMUCOSAL | 3 refills | Status: DC

## 2021-01-10 NOTE — Assessment & Plan Note (Signed)
He has pain around the G-tube as well as some pain in his mouth.  He says he has to take pain medication as needed for both throat pain as well as the pain around the G-tube.  No evidence of infection around the G-tube.  He is currently using about 3 pills of oxycodone a day. We will continue to monitor this.

## 2021-01-10 NOTE — Assessment & Plan Note (Signed)
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 He is s.p 2 weekly cycles of cisplatin. ROS pertinent for sore throat, tinnitus, some pain around the G-tube. Physical examination today, improving cervical lymphadenopathy, mild erythema of the buccal mucosa, otherwise no major findings. CBC reviewed okay to proceed with that treatment, BMP and magnesium are pending at the time of my visit. If BMP is within parameters, okay to proceed with week 3 of cisplatin as planned for tomorrow.

## 2021-01-10 NOTE — Assessment & Plan Note (Signed)
No change in tinnitus compared to last visit. It is slightly uncommon to see tinnitus so early with cisplatin however at this time we do not believe that a dose reduction is needed.  No hearing loss.

## 2021-01-10 NOTE — Assessment & Plan Note (Signed)
No weight loss since last week, weight has maintained at approximately 243 pounds.  He is not using the G-tube at this time, able to swallow his food.

## 2021-01-10 NOTE — Progress Notes (Signed)
[name] NOTE  Patient Care Team: Pa, Shannon as PCP - General (Family Medicine)  CHIEF COMPLAINTS/PURPOSE OF CONSULTATION:  HPV positive SCC oropharynx.  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 He is s.p 2 weekly cycles of cisplatin. ROS pertinent for sore throat, tinnitus, some pain around the G-tube. Physical examination today, improving cervical lymphadenopathy, mild erythema of the buccal mucosa, otherwise no major findings. CBC reviewed okay to proceed with that treatment, BMP and magnesium are pending at the time of my visit. If BMP is within parameters, okay to proceed with week 3 of cisplatin as planned for tomorrow.    Neoplasm related pain He has pain around the G-tube as well as some pain in his mouth.  He says he has to take pain medication as needed for both throat pain as well as the pain around the G-tube.  No evidence of infection around the G-tube.  He is currently using about 3 pills of oxycodone a day. We will continue to monitor this.  Tinnitus No change in tinnitus compared to last visit. It is slightly uncommon to see tinnitus so early with cisplatin however at this time we do not believe that a dose reduction is needed.  No hearing loss.  Weight loss, unintentional No weight loss since last week, weight has maintained at approximately 243 pounds.  He is not using the G-tube at this time, able to swallow his food.  No orders of the defined types were placed in this encounter.    HISTORY OF PRESENTING ILLNESS:  Gerald [name] 50 y.o. male is here because of left tonsillar cancer, HPV positive.  Oncology History Overview Note  50 yr old male  who has experiencing chronic sore throat for the past few months. He underwent a CT neck which showed primarily a submucosal 3 cm mass within the left tonsillar fossa also noted to have 3.2 cm left jugulodigastric lymph node, findings were concerning for left tonsillar malignancy and malignant lymph node. He underwent left tonsillectomy by Dr. [name] and pathology showed invasive squamous cell carcinoma, HPV mediated.  He He had PET imaging which showed evidence of left tonsillar cancer and left ipsilateral lymph nodes, from my review, no evidence of distant metastasis, I could not see an official report on the PET imaging. He is here for initial recommendations.   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[place])  12/06/2020 Initial Diagnosis   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[place])   12/06/2020 Cancer Staging   Staging form: Pharynx - HPV-Mediated Oropharynx, AJCC 8th Edition - Clinical stage from 12/06/2020: Stage I (cT2, cN1, cM0, p16+) - Signed by Benay Pike, MD on 12/06/2020  Stage prefix: Initial diagnosis    12/27/2020 -  Chemotherapy    Patient is on Treatment Plan: HEAD/NECK CISPLATIN Q7D       Cancer of tonsillar fossa ([place])  12/07/2020 Initial Diagnosis   Cancer of tonsillar fossa ([place])    12/07/2020 Cancer Staging   Staging form: Pharynx - HPV-Mediated Oropharynx, AJCC 8th Edition - Clinical stage from 12/07/2020: Stage I (cT2, cN1, cM0, p16+) - Signed by Eppie Gibson, MD on 12/07/2020  Stage prefix: Initial diagnosis     Interval History  He is currently on concurrent CRT. Tomorrow is  C3 of cisplatin Ongoing tinnitus, no hearing loss Some soreness in mouth, able to swallow food, maintaining weight. No change in bowel habits, urinary habits. Not using the G tube, flushing about 280 cc of water a day Using nausea meds PRN, no vomiting. No neuropathy reported.  Rest of the pertinent 10 point ROS reviewed and negative.  MEDICAL HISTORY:  Past Medical History:   Diagnosis Date   Sleep apnea    Tonsillar mass 11/16/2020    SURGICAL HISTORY: Past Surgical History:  Procedure Laterality Date   DIRECT LARYNGOSCOPY N/A 11/22/2020   Procedure: DIRECT LARYNGOSCOPY WITH BIOPSY;  Surgeon: Leta Baptist, MD;  Location: [facility];  Service: ENT;  Laterality: N/A;   FOOT SURGERY  2000   extra nivicular removal   IR GASTROSTOMY TUBE MOD SED  12/24/2020   IR IMAGING GUIDED PORT INSERTION  12/24/2020   SPINE SURGERY     TONSILLECTOMY Left 11/22/2020   Procedure: LEFT TONSILLECTOMY;  Surgeon: Leta Baptist, MD;  Location: [facility];  Service: ENT;  Laterality: Left;    SOCIAL HISTORY: Social History   Socioeconomic History   Marital status: Married    Spouse name: Not on file   Number of children: Not on file   Years of education: Not on file   Highest education level: Not on file  Occupational History   Not on file  Tobacco Use   Smoking status: Never   Smokeless tobacco: Never  Vaping Use   Vaping Use: Never used  Substance and Sexual Activity   Alcohol use: Not Currently    Comment: rare   Drug use: Never   Sexual activity: Not Currently  Other Topics Concern   Not on file  Social History Narrative   Not on file   Social Determinants of Health   Financial Resource Strain: Low Risk    Difficulty of Paying Living Expenses: Not hard at all  Food Insecurity: No Food Insecurity   Worried About Charity fundraiser in the Last Year: Never true   Arboriculturist in the Last Year: Never true  Transportation Needs: No Transportation Needs   Lack of Transportation (Medical): No   Lack of Transportation (Non-Medical): No  Physical Activity: Not on file  Stress: No Stress Concern Present   Feeling of Stress : Only a little  Social Connections: Engineer, building services of Communication with Friends and Family: More than three times a week   Frequency of Social Gatherings with Friends and Family: Three times a week    Attends Religious Services: 1 to 4 times per year   Active Member of Clubs or Organizations: Yes   Attends Archivist Meetings: 1 to 4 times per year   Marital Status: Married  Human resources officer Violence: Not At Risk   Fear of Current or Ex-Partner: No   Emotionally Abused: No   Physically Abused: No   Sexually Abused: No    FAMILY HISTORY: History reviewed. No pertinent family history.  ALLERGIES:  has No Known Allergies.  MEDICATIONS:  Current Outpatient Medications  Medication Sig Dispense Refill   dexamethasone (DECADRON) 4 MG tablet Take 2 tablets (8 mg total) by mouth daily. Take daily x 3 days starting the day after cisplatin chemotherapy. Take with food. 30 tablet 1   lidocaine-prilocaine (EMLA) cream Apply to affected area once 30 g 3   Multiple Vitamins-Minerals (MULTIVITAMIN WITH MINERALS) tablet Take 1 tablet by mouth daily.  Nutritional Supplements (FEEDING SUPPLEMENT, OSMOLITE 1.5 CAL,) LIQD Give 7 cartons Osmolite 1.5 via PEG split into 4 feedings with 60 mL free water before and after.  Give 45 mL Prosource TF or equivalent 3 times daily with 30 mL free water before and after.  Give additional 720 mL free water via PEG daily.  Provides 2620 cal, 137.3 g protein, 2647 mL of free water/100% estimated nutrition needs. 1659 mL 6   ondansetron (ZOFRAN) 8 MG tablet Take 1 tablet (8 mg total) by mouth 2 (two) times daily as needed. Start on the third day after cisplatin chemotherapy. 30 tablet 1   oxyCODONE-acetaminophen (PERCOCET) 7.5-325 MG tablet Take 1 tablet by mouth every 6 (six) hours as needed for severe pain. 60 tablet 0   prochlorperazine (COMPAZINE) 10 MG tablet Take 1 tablet (10 mg total) by mouth every 6 (six) hours as needed (Nausea or vomiting). 30 tablet 1   No current facility-administered medications for this visit.    PHYSICAL EXAMINATION: ECOG PERFORMANCE STATUS: 1 - Symptomatic but completely ambulatory  Vitals:   01/10/21 0857  BP: 119/78   Pulse: (!) 57  Resp: 18  Temp: 97.8 F (36.6 C)  SpO2: 98%   Filed Weights   01/10/21 0857  Weight: 242 lb 12.8 oz (110.1 kg)    Physical Exam Constitutional:      Appearance: Normal appearance.  HENT:     Head: Normocephalic and atraumatic.  Cardiovascular:     Rate and Rhythm: Normal rate and regular rhythm.     Pulses: Normal pulses.     Heart sounds: Normal heart sounds.  Pulmonary:     Effort: Pulmonary effort is normal.     Breath sounds: Normal breath sounds.  Abdominal:     General: Abdomen is flat. Bowel sounds are normal.     Palpations: Abdomen is soft.     Comments: G tube site appears well  Musculoskeletal:        General: No swelling, tenderness, deformity or signs of injury.     Cervical back: Normal range of motion and neck supple. No rigidity.  Lymphadenopathy:     Cervical: No cervical adenopathy (lymphadenopathy appears to be smaller).  Skin:    General: Skin is warm and dry.  Neurological:     General: No focal deficit present.     Mental Status: He is alert and oriented to person, place, and time.  Psychiatric:        Mood and Affect: Mood normal.        Behavior: Behavior normal.    LABORATORY DATA:  I have reviewed the data as listed Lab Results  Component Value Date   WBC 8.4 01/10/2021   HGB 11.9 (L) 01/10/2021   HCT 34.3 (L) 01/10/2021   MCV 94.2 01/10/2021   PLT 151 01/10/2021     Chemistry      Component Value Date/Time   NA 135 01/10/2021 0838   K 3.5 01/10/2021 0838   CL 102 01/10/2021 0838   CO2 28 01/10/2021 0838   BUN 19 01/10/2021 0838   CREATININE 1.20 01/10/2021 0838      Component Value Date/Time   CALCIUM 8.4 (L) 01/10/2021 0998       RADIOGRAPHIC STUDIES: I have personally reviewed the radiological images as listed and agreed with the findings in the report. IR Gastrostomy Tube  Result Date: 12/24/2020 CLINICAL DATA:  Tonsillar carcinoma, needs enteral feeding support EXAM: PERC PLACEMENT GASTROSTOMY  FLUOROSCOPY TIME:  2 minutes 24  seconds; 58 mGy TECHNIQUE: The procedure, risks, benefits, and alternatives were explained to the patient. Questions regarding the procedure were encouraged and answered. The patient understands and consents to the procedure. As antibiotic prophylaxis, cefazolin 2 g was ordered pre-procedure and administered intravenously within one hour of incision.Progression of previously administered oral barium into the colon was confirmed fluoroscopically. A 5 French angiographic catheter was placed as orogastric tube. The upper abdomen was prepped with Betadine, draped in usual sterile fashion, and infiltrated locally with 1% lidocaine. Intravenous Fentanyl 123mcg and Versed 4mg  were administered as conscious sedation during continuous monitoring of the patient's level of consciousness and physiological / cardiorespiratory status by the radiology RN, with a total moderate sedation time of 71 minutes. Stomach was insufflated using air through the orogastric tube. An 62 French sheath needle was advanced percutaneously into the gastric lumen under fluoroscopy. Gas could be aspirated and a small contrast injection confirmed intraluminal spread. The sheath was exchanged over a guidewire for a 9 [place] vascular sheath, through which the snare device was advanced and used to snare a guidewire passed through the orogastric tube. This was withdrawn, and the snare attached to the 20 French pull-through gastrostomy tube, which was advanced antegrade, positioned with the internal bumper securing the anterior gastric wall to the anterior abdominal wall. Small contrast injection confirms appropriate positioning. The external bumper was applied and the catheter was flushed. COMPLICATIONS: COMPLICATIONS none IMPRESSION: 1. Technically successful 20 French pull-through gastrostomy placement under fluoroscopy. Electronically Signed   By: Lucrezia Europe M.D.   On: 12/24/2020 13:30   IR IMAGING GUIDED PORT  INSERTION  Result Date: 12/24/2020 CLINICAL DATA:  Left tonsillar carcinoma EXAM: TUNNELED PORT CATHETER PLACEMENT WITH ULTRASOUND AND FLUOROSCOPIC GUIDANCE FLUOROSCOPY TIME:  6 seconds; 1 mGy ANESTHESIA/SEDATION: Intravenous Fentanyl 150mcg and Versed 2mg  were administered as conscious sedation during continuous monitoring of the patient's level of consciousness and physiological / cardiorespiratory status by the radiology RN, with a total moderate sedation time of 20 minutes. TECHNIQUE: The procedure, risks, benefits, and alternatives were explained to the patient. Questions regarding the procedure were encouraged and answered. The patient understands and consents to the procedure. Patency of the right IJ vein was confirmed with ultrasound with image documentation. An appropriate skin site was determined. Skin site was marked. Region was prepped using maximum barrier technique including cap and mask, sterile gown, sterile gloves, large sterile sheet, and Chlorhexidine as cutaneous antisepsis. The region was infiltrated locally with 1% lidocaine. Under real-time ultrasound guidance, the right IJ vein was accessed with a 21 gauge micropuncture needle; the needle tip within the vein was confirmed with ultrasound image documentation. Needle was exchanged over a 018 guidewire for transitional dilator, and vascular measurement was performed. A small incision was made on the right anterior chest wall and a subcutaneous pocket fashioned. The power-injectable port was positioned and its catheter tunneled to the right IJ dermatotomy site. The transitional dilator was exchanged over an Amplatz wire for a peel-away sheath, through which the port catheter, which had been trimmed to the appropriate length, was advanced and positioned under fluoroscopy with its tip at the cavoatrial junction. Spot chest radiograph confirms good catheter position and no pneumothorax. The port was flushed per protocol. The pocket was closed with  deep interrupted and subcuticular continuous 3-0 Monocryl sutures. The incisions were covered with Dermabond then covered with a sterile dressing. The patient tolerated the procedure well. COMPLICATIONS: COMPLICATIONS None immediate IMPRESSION: Technically successful right IJ power-injectable port catheter placement. Ready for  routine use. Electronically Signed   By: Lucrezia Europe M.D.   On: 12/24/2020 10:49     All questions were answered. The patient knows to call the clinic with any problems, questions or concerns.   Benay Pike, MD 01/10/2021 9:41 AM

## 2021-01-11 ENCOUNTER — Inpatient Hospital Stay: Payer: Managed Care, Other (non HMO)

## 2021-01-11 ENCOUNTER — Inpatient Hospital Stay: Payer: Managed Care, Other (non HMO) | Admitting: Nutrition

## 2021-01-11 ENCOUNTER — Ambulatory Visit
Admission: RE | Admit: 2021-01-11 | Discharge: 2021-01-11 | Disposition: A | Discharge status: Home / Self Care | Payer: Managed Care, Other (non HMO) | Source: Ambulatory Visit | Attending: Radiation Oncology | Admitting: Radiation Oncology

## 2021-01-11 VITALS — BP 106/71 | HR 62 | Temp 98.7°F | Resp 18

## 2021-01-11 DIAGNOSIS — R1310 Dysphagia, unspecified: Secondary | ICD-10-CM | POA: Diagnosis not present

## 2021-01-11 DIAGNOSIS — C109 Malignant neoplasm of oropharynx, unspecified: Secondary | ICD-10-CM

## 2021-01-11 DIAGNOSIS — R293 Abnormal posture: Secondary | ICD-10-CM | POA: Diagnosis not present

## 2021-01-11 MED ORDER — POTASSIUM CHLORIDE IN NACL 20-0.9 MEQ/L-% IV SOLN
Freq: Once | INTRAVENOUS | Status: AC
  Filled 2021-01-11: qty 1000

## 2021-01-11 MED ORDER — PALONOSETRON HCL INJECTION 0.25 MG/5ML
INTRAVENOUS | Status: AC
  Filled 2021-01-11: qty 5

## 2021-01-11 MED ORDER — MAGNESIUM SULFATE 2 GM/50ML IV SOLN
INTRAVENOUS | Status: AC
  Filled 2021-01-11: qty 50

## 2021-01-11 MED ORDER — SODIUM CHLORIDE 0.9 % IV SOLN
150.0000 mg | Freq: Once | INTRAVENOUS | Status: AC
  Administered 2021-01-11: 150 mg via INTRAVENOUS
  Filled 2021-01-11: qty 150

## 2021-01-11 MED ORDER — SODIUM CHLORIDE 0.9% FLUSH
10.0000 mL | INTRAVENOUS | Status: DC | PRN
  Administered 2021-01-11: 10 mL
  Filled 2021-01-11: qty 10

## 2021-01-11 MED ORDER — CISPLATIN CHEMO INJECTION 100MG/100ML
40.0000 mg/m2 | Freq: Once | INTRAVENOUS | Status: AC
  Administered 2021-01-11: 94 mg via INTRAVENOUS
  Filled 2021-01-11: qty 94

## 2021-01-11 MED ORDER — SODIUM CHLORIDE 0.9 % IV SOLN
10.0000 mg | Freq: Once | INTRAVENOUS | Status: AC
  Administered 2021-01-11: 10 mg via INTRAVENOUS
  Filled 2021-01-11: qty 10

## 2021-01-11 MED ORDER — PALONOSETRON HCL INJECTION 0.25 MG/5ML
0.2500 mg | Freq: Once | INTRAVENOUS | Status: AC
  Administered 2021-01-11: 0.25 mg via INTRAVENOUS

## 2021-01-11 MED ORDER — MAGNESIUM SULFATE 2 GM/50ML IV SOLN
2.0000 g | Freq: Once | INTRAVENOUS | Status: AC
  Administered 2021-01-11: 2 g via INTRAVENOUS

## 2021-01-11 MED ORDER — SODIUM CHLORIDE 0.9 % IV SOLN
Freq: Once | INTRAVENOUS | Status: AC
  Filled 2021-01-11: qty 250

## 2021-01-11 MED ORDER — HEPARIN SOD (PORK) LOCK FLUSH 100 UNIT/ML IV SOLN
500.0000 [IU] | Freq: Once | INTRAVENOUS | Status: AC | PRN
  Administered 2021-01-11: 500 [IU]
  Filled 2021-01-11: qty 5

## 2021-01-11 NOTE — Progress Notes (Signed)
Nutrition follow-up completed with patient receiving concurrent chemoradiation therapy with cisplatin for tonsil cancer.  Today is cycle 3 of chemotherapy.  He has completed 11 out of 35 radiation therapy treatments.  Patient noted increased pain with swallowing.  He continues to have pain around his PEG site.  Reports he is having constipation but has not started any medications yet.  Reports his diet consists of "sweets and junk food". He is not eating much protein as meats are not well-tolerated.  He does tolerate eggs.  He has not used his feeding tube or consumed oral nutrition supplements.  He is flushing his feeding tube with 8 ounces of water daily.  Patient's weight is stable at 242.2 pounds on June 20 from 243.2 pounds.  Estimated nutrition needs: 2600-2800 cal, 130-145 g protein, 2.8 L fluid.  Nutrition diagnosis: Predicted suboptimal energy intake continues.  Intervention: Educated patient on the importance of increased calories and protein in small frequent meals and snacks.  Patient verbalizes understanding and agrees to increase oral nutrition supplements.  I will provide samples.  Patient is agreeable to drinking 2 cartons daily. Recommended patient add 4 ounces of prune juice daily for bowel regimen.  He can drink this product or use through his feeding tube as long as he flushes feeding tube with 60 ounces of water after infusing prune juice.  Patient agreeable. Continue increased fluid intake.  Monitoring, evaluation, goals: Patient will tolerate adequate calories and protein to minimize weight loss.  Next visit: Tuesday, June 28 during infusion.  **Disclaimer: This note was dictated with voice recognition software. Similar sounding words can inadvertently be transcribed and this note may contain transcription errors which may not have been corrected upon publication of note.**

## 2021-01-11 NOTE — Patient Instructions (Signed)
[facility]    Discharge Instructions: Thank you for choosing [facility] to provide your oncology and hematology care.   If you have a lab appointment with the [facility], please go directly to the [facility] and check in at the registration area.   Wear comfortable clothing and clothing appropriate for easy access to any Portacath or PICC line.   We strive to give you quality time with your provider. You may need to reschedule your appointment if you arrive late (15 or more minutes).  Arriving late affects you and other patients whose appointments are after yours.  Also, if you miss three or more appointments without notifying the office, you may be dismissed from the clinic at the provider's discretion.      For prescription refill requests, have your pharmacy contact our office and allow 72 hours for refills to be completed.    Today you received the following chemotherapy and/or immunotherapy agents: cisplatin.      To help prevent nausea and vomiting after your treatment, we encourage you to take your nausea medication as directed.  BELOW ARE SYMPTOMS THAT SHOULD BE REPORTED IMMEDIATELY: *FEVER GREATER THAN 100.4 F (38 C) OR HIGHER *CHILLS OR SWEATING *NAUSEA AND VOMITING THAT IS NOT CONTROLLED WITH YOUR NAUSEA MEDICATION *UNUSUAL SHORTNESS OF BREATH *UNUSUAL BRUISING OR BLEEDING *URINARY PROBLEMS (pain or burning when urinating, or frequent urination) *BOWEL PROBLEMS (unusual diarrhea, constipation, pain near the anus) TENDERNESS IN MOUTH AND THROAT WITH OR WITHOUT PRESENCE OF ULCERS (sore throat, sores in mouth, or a toothache) UNUSUAL RASH, SWELLING OR PAIN  UNUSUAL VAGINAL DISCHARGE OR ITCHING   Items with * indicate a potential emergency and should be followed up as soon as possible or go to the Emergency Department if any problems should occur.  Please show the CHEMOTHERAPY ALERT CARD or IMMUNOTHERAPY ALERT CARD at  check-in to the Emergency Department and triage nurse.  Should you have questions after your visit or need to cancel or reschedule your appointment, please contact [facility]  Dept: [phone]  and follow the prompts.  Office hours are 8:00 a.m. to 4:30 p.m. Monday - Friday. Please note that voicemails left after 4:00 p.m. may not be returned until the following business day.  We are closed weekends and major holidays. You have access to a nurse at all times for urgent questions. Please call the main number to the clinic Dept: [phone] and follow the prompts.   For any non-urgent questions, you may also contact your provider using MyChart. We now offer e-Visits for anyone 62 and older to request care online for non-urgent symptoms. For details visit [email or web address].   Also download the MyChart app! Go to the app store, search "MyChart", open the app, select [facility], and log in with your MyChart username and password.  Due to Covid, a mask is required upon entering the hospital/clinic. If you do not have a mask, one will be given to you upon arrival. For doctor visits, patients may have 1 support person aged 69 or older with them. For treatment visits, patients cannot have anyone with them due to current Covid guidelines and our immunocompromised population.   Cisplatin injection What is this medicine? CISPLATIN (SIS pla tin) is a chemotherapy drug. It targets fast dividing cells, like cancer cells, and causes these cells to die. This medicine is used to treat many types of cancer like bladder, ovarian, and testicular cancers. This medicine may  be used for other purposes; ask your health care provider or pharmacist if you have questions. COMMON BRAND NAME(S): Platinol, Platinol -AQ What should I tell my health care provider before I take this medicine? They need to know if you have any of these conditions: eye disease, vision problems hearing  problems kidney disease low blood counts, like white cells, platelets, or red blood cells tingling of the fingers or toes, or other nerve disorder an unusual or allergic reaction to cisplatin, carboplatin, oxaliplatin, other medicines, foods, dyes, or preservatives pregnant or trying to get pregnant breast-feeding How should I use this medicine? This drug is given as an infusion into a vein. It is administered in a hospital or clinic by a specially trained health care professional. Talk to your pediatrician regarding the use of this medicine in children. Special care may be needed. Overdosage: If you think you have taken too much of this medicine contact a poison control center or emergency room at once. NOTE: This medicine is only for you. Do not share this medicine with others. What if I miss a dose? It is important not to miss a dose. Call your doctor or health care professional if you are unable to keep an appointment. What may interact with this medicine? This medicine may interact with the following medications: foscarnet certain antibiotics like amikacin, gentamicin, neomycin, polymyxin B, streptomycin, tobramycin, vancomycin This list may not describe all possible interactions. Give your health care provider a list of all the medicines, herbs, non-prescription drugs, or dietary supplements you use. Also tell them if you smoke, drink alcohol, or use illegal drugs. Some items may interact with your medicine. What should I watch for while using this medicine? Your condition will be monitored carefully while you are receiving this medicine. You will need important blood work done while you are taking this medicine. This drug may make you feel generally unwell. This is not uncommon, as chemotherapy can affect healthy cells as well as cancer cells. Report any side effects. Continue your course of treatment even though you feel ill unless your doctor tells you to stop. This medicine may  increase your risk of getting an infection. Call your healthcare professional for advice if you get a fever, chills, or sore throat, or other symptoms of a cold or flu. Do not treat yourself. Try to avoid being around people who are sick. Avoid taking medicines that contain aspirin, acetaminophen, ibuprofen, naproxen, or ketoprofen unless instructed by your healthcare professional. These medicines may hide a fever. This medicine may increase your risk to bruise or bleed. Call your doctor or health care professional if you notice any unusual bleeding. Be careful brushing and flossing your teeth or using a toothpick because you may get an infection or bleed more easily. If you have any dental work done, tell your dentist you are receiving this medicine. Do not become pregnant while taking this medicine or for 14 months after stopping it. Women should inform their healthcare professional if they wish to become pregnant or think they might be pregnant. Men should not father a child while taking this medicine and for 11 months after stopping it. There is potential for serious side effects to an unborn child. Talk to your healthcare professional for more information. Do not breast-feed an infant while taking this medicine. This medicine has caused ovarian failure in some women. This medicine may make it more difficult to get pregnant. Talk to your healthcare professional if you are concerned about your fertility. This  medicine has caused decreased sperm counts in some men. This may make it more difficult to father a child. Talk to your healthcare professional if you are concerned about your fertility. Drink fluids as directed while you are taking this medicine. This will help protect your kidneys. Call your doctor or health care professional if you get diarrhea. Do not treat yourself. What side effects may I notice from receiving this medicine? Side effects that you should report to your doctor or health care  professional as soon as possible: allergic reactions like skin rash, itching or hives, swelling of the face, lips, or tongue blurred vision changes in vision decreased hearing or ringing of the ears nausea, vomiting pain, redness, or irritation at site where injected pain, tingling, numbness in the hands or feet signs and symptoms of bleeding such as bloody or black, tarry stools; red or dark brown urine; spitting up blood or brown material that looks like coffee grounds; red spots on the skin; unusual bruising or bleeding from the eyes, gums, or nose signs and symptoms of infection like fever; chills; cough; sore throat; pain or trouble passing urine signs and symptoms of kidney injury like trouble passing urine or change in the amount of urine signs and symptoms of low red blood cells or anemia such as unusually weak or tired; feeling faint or lightheaded; falls; breathing problems Side effects that usually do not require medical attention (report to your doctor or health care professional if they continue or are bothersome): loss of appetite mouth sores muscle cramps This list may not describe all possible side effects. Call your doctor for medical advice about side effects. You may report side effects to FDA at 1-800-FDA-1088. Where should I keep my medicine? This drug is given in a hospital or clinic and will not be stored at home. NOTE: This sheet is a summary. It may not cover all possible information. If you have questions about this medicine, talk to your doctor, pharmacist, or health care provider.  2021 Elsevier/Gold Standard (2018-07-05 15:59:17)

## 2021-01-12 ENCOUNTER — Ambulatory Visit
Admission: RE | Admit: 2021-01-12 | Discharge: 2021-01-12 | Disposition: A | Discharge status: Home / Self Care | Payer: Managed Care, Other (non HMO) | Source: Ambulatory Visit | Attending: Radiation Oncology | Admitting: Radiation Oncology

## 2021-01-12 ENCOUNTER — Other Ambulatory Visit: Payer: Self-pay

## 2021-01-12 ENCOUNTER — Telehealth: Payer: Self-pay

## 2021-01-12 DIAGNOSIS — R293 Abnormal posture: Secondary | ICD-10-CM | POA: Diagnosis not present

## 2021-01-12 DIAGNOSIS — R1310 Dysphagia, unspecified: Secondary | ICD-10-CM | POA: Diagnosis not present

## 2021-01-12 NOTE — Telephone Encounter (Signed)
Spoke with Gerald [name] concerning an Authorization for Release of Information regarding his short term disability. He will come by to sign the release after his treatment today.

## 2021-01-13 ENCOUNTER — Ambulatory Visit
Admission: RE | Admit: 2021-01-13 | Discharge: 2021-01-13 | Disposition: A | Discharge status: Home / Self Care | Payer: Managed Care, Other (non HMO) | Source: Ambulatory Visit | Attending: Radiation Oncology | Admitting: Radiation Oncology

## 2021-01-13 ENCOUNTER — Other Ambulatory Visit: Payer: Self-pay

## 2021-01-13 DIAGNOSIS — R1310 Dysphagia, unspecified: Secondary | ICD-10-CM | POA: Diagnosis not present

## 2021-01-13 DIAGNOSIS — R293 Abnormal posture: Secondary | ICD-10-CM | POA: Diagnosis not present

## 2021-01-14 ENCOUNTER — Other Ambulatory Visit: Payer: Self-pay | Admitting: Hematology and Oncology

## 2021-01-14 ENCOUNTER — Ambulatory Visit
Admission: RE | Admit: 2021-01-14 | Discharge: 2021-01-14 | Disposition: A | Discharge status: Home / Self Care | Payer: Managed Care, Other (non HMO) | Source: Ambulatory Visit | Attending: Radiation Oncology | Admitting: Radiation Oncology

## 2021-01-14 DIAGNOSIS — R293 Abnormal posture: Secondary | ICD-10-CM | POA: Diagnosis not present

## 2021-01-14 DIAGNOSIS — R1310 Dysphagia, unspecified: Secondary | ICD-10-CM | POA: Diagnosis not present

## 2021-01-14 MED ORDER — MORPHINE SULFATE ER 15 MG PO TBCR: 15.0000 mg | EXTENDED_RELEASE_TABLET | Freq: Two times a day (BID) | ORAL | 0 refills | Status: DC

## 2021-01-14 NOTE — Progress Notes (Signed)
Started MS contin 15 mg PO BID in addition to oxycodone.  Gerald [name]

## 2021-01-15 ENCOUNTER — Inpatient Hospital Stay: Payer: Managed Care, Other (non HMO)

## 2021-01-15 ENCOUNTER — Other Ambulatory Visit: Payer: Self-pay

## 2021-01-15 VITALS — BP 117/63 | HR 56 | Temp 97.5°F | Resp 18

## 2021-01-15 DIAGNOSIS — C109 Malignant neoplasm of oropharynx, unspecified: Secondary | ICD-10-CM

## 2021-01-15 DIAGNOSIS — R293 Abnormal posture: Secondary | ICD-10-CM | POA: Diagnosis not present

## 2021-01-15 DIAGNOSIS — R1310 Dysphagia, unspecified: Secondary | ICD-10-CM | POA: Diagnosis not present

## 2021-01-15 MED ORDER — SODIUM CHLORIDE 0.9% FLUSH
10.0000 mL | Freq: Once | INTRAVENOUS | Status: AC | PRN
  Administered 2021-01-15: 10 mL
  Filled 2021-01-15: qty 10

## 2021-01-15 MED ORDER — HEPARIN SOD (PORK) LOCK FLUSH 100 UNIT/ML IV SOLN
500.0000 [IU] | Freq: Once | INTRAVENOUS | Status: AC | PRN
  Administered 2021-01-15: 500 [IU]
  Filled 2021-01-15: qty 5

## 2021-01-15 MED ORDER — SODIUM CHLORIDE 0.9 % IV SOLN
Freq: Once | INTRAVENOUS | Status: AC
  Filled 2021-01-15: qty 250

## 2021-01-15 NOTE — Patient Instructions (Signed)

## 2021-01-16 ENCOUNTER — Other Ambulatory Visit: Payer: Self-pay | Admitting: Hematology

## 2021-01-16 NOTE — Progress Notes (Signed)
Gerald [name]   Telephone:([phone]) [phone] Fax:(336) [phone]   Clinic Follow up Note   Patient Care Team: Pa, [place] as PCP - General (Family Medicine) 01/17/2021  CHIEF COMPLAINT: Follow up HPV+ scc of oropharynx   SUMMARY OF ONCOLOGIC HISTORY: Oncology History Overview Note  50 yr old male who has experiencing chronic sore throat for the past few months. He underwent a CT neck which showed primarily a submucosal 3 cm mass within the left tonsillar fossa also noted to have 3.2 cm left jugulodigastric lymph node, findings were concerning for left tonsillar malignancy and malignant lymph node. He underwent left tonsillectomy by Dr. Benjamine [name] and pathology showed invasive squamous cell carcinoma, HPV mediated.  He He had PET imaging which showed evidence of left tonsillar cancer and left ipsilateral lymph nodes, from my review, no evidence of distant metastasis, I could not see an official report on the PET imaging. He is here for initial recommendations.   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[facility])   12/06/2020 Cancer Staging   Staging form: Pharynx - HPV-Mediated Oropharynx, AJCC 8th Edition - Clinical stage from 12/06/2020: Stage I (cT2, cN1, cM0, p16+) - Signed by Benay Pike, MD on 12/06/2020  Stage prefix: Initial diagnosis    12/27/2020 -  Chemotherapy    Patient is on Treatment Plan: HEAD/NECK CISPLATIN Q7D       Cancer of tonsillar fossa ([facility])  12/07/2020 Initial Diagnosis   Cancer of tonsillar fossa ([facility])    12/07/2020 Cancer Staging   Staging form: Pharynx - HPV-Mediated Oropharynx, AJCC 8th Edition - Clinical stage from 12/07/2020: Stage I (cT2, cN1, cM0, p16+) - Signed by Eppie Gibson, MD on 12/07/2020  Stage prefix: Initial diagnosis      CURRENT THERAPY: chemoRT with weekly cisplatin   INTERVAL HISTORY: Mr. Gerald [name] returns for follow up and treatment  as scheduled. He was last seen 01/10/21, received week 3 cisplatin 6/21, and IVF as scheduled on 6/25. He tends to feel better after chemo when he receives steroids and fluids, and after IVF on saturdays. As of 6/24 he can't tolerate more than a couple bites of mashed potatoes due to throat pain, now doing all tube feedings. He tries to do 7 cans osmolite per day and is up at night trying to get it in. He has bloating and n/v after tube feels. His nose runs and he also gets choked on phlegm which causes him to gag and vomit. He alternates zofran and compazine all day. He is struggling to stay hydrated and nourished. His weight is down 10 lbs from last week, but stable over past 2 days per home scale. MS contin has not been available at his pharmacy but should be able to pick it up today. Takes 2 percocets q6 which wears off quickly. He is more fatigued, but still up at home. Tinnitus is louder, denies hearing loss. Denies fever, chills, cough, chest pain, dyspnea.     MEDICAL HISTORY:  Past Medical History:  Diagnosis Date   Sleep apnea    Tonsillar mass 11/16/2020    SURGICAL HISTORY: Past Surgical History:  Procedure Laterality Date   DIRECT LARYNGOSCOPY N/A 11/22/2020   Procedure: DIRECT LARYNGOSCOPY WITH BIOPSY;  Surgeon: Leta Baptist, MD;  Location: [facility];  Service: ENT;  Laterality: N/A;   FOOT SURGERY  2000   extra nivicular removal   IR GASTROSTOMY TUBE MOD SED  12/24/2020  IR IMAGING GUIDED PORT INSERTION  12/24/2020   SPINE SURGERY     TONSILLECTOMY Left 11/22/2020   Procedure: LEFT TONSILLECTOMY;  Surgeon: Leta Baptist, MD;  Location: [facility];  Service: ENT;  Laterality: Left;    I have reviewed the social history and family history with the patient and they are unchanged from previous note.  ALLERGIES:  has No Known Allergies.  MEDICATIONS:  Current Outpatient Medications  Medication Sig Dispense Refill   dexamethasone (DECADRON) 4 MG tablet Take 2  tablets (8 mg total) by mouth daily. Take daily x 3 days starting the day after cisplatin chemotherapy. Take with food. 30 tablet 1   lidocaine (XYLOCAINE) 2 % solution Patient: Mix 1part 2% viscous lidocaine, 1part H20. Swish & swallow 24mL of diluted mixture, 3min before meals and at bedtime, up to QID 200 mL 3   lidocaine-prilocaine (EMLA) cream Apply to affected area once 30 g 3   morphine (MS CONTIN) 15 MG 12 hr tablet Take 1 tablet (15 mg total) by mouth every 12 (twelve) hours. 30 tablet 0   Multiple Vitamin (MULTI-VITAMIN) tablet Take 1 tablet by mouth daily.     Multiple Vitamins-Minerals (MULTIVITAMIN WITH MINERALS) tablet Take 1 tablet by mouth daily.     Nutritional Supplements (FEEDING SUPPLEMENT, OSMOLITE 1.5 CAL,) LIQD Give 7 cartons Osmolite 1.5 via PEG split into 4 feedings with 60 mL free water before and after.  Give 45 mL Prosource TF or equivalent 3 times daily with 30 mL free water before and after.  Give additional 720 mL free water via PEG daily.  Provides 2620 cal, 137.3 g protein, 2647 mL of free water/100% estimated nutrition needs. 1659 mL 6   ondansetron (ZOFRAN) 8 MG tablet Take 1 tablet (8 mg total) by mouth 2 (two) times daily as needed. Start on the third day after cisplatin chemotherapy. 30 tablet 1   oxyCODONE-acetaminophen (PERCOCET) 7.5-325 MG tablet Take 1 tablet by mouth every 6 (six) hours as needed for severe pain. 60 tablet 0   prochlorperazine (COMPAZINE) 10 MG tablet Take 1 tablet (10 mg total) by mouth every 6 (six) hours as needed (Nausea or vomiting). 30 tablet 1   No current facility-administered medications for this visit.    PHYSICAL EXAMINATION: ECOG PERFORMANCE STATUS: 1 - Symptomatic but completely ambulatory  Vitals:   01/17/21 1126  BP: 129/82  Pulse: 64  Resp: 18  Temp: 98.3 F (36.8 C)  SpO2: 100%   Filed Weights   01/17/21 1126  Weight: 232 lb 4.8 oz (105.4 kg)    GENERAL:alert, no distress and comfortable SKIN: no rash or  significant erythema EYES:  sclera clear OROPHARYNX:oropharyngeal erythema, no thrush or ulcers  NECK: without mass  LUNGS: clear with normal breathing effort HEART: regular rate & rhythm, no lower extremity edema NEURO: alert & oriented x 3 with fluent speech, no focal motor deficits PAC without erythema   LABORATORY DATA:  I have reviewed the data as listed CBC Latest Ref Rng & Units 01/17/2021 01/10/2021 01/05/2021  WBC 4.0 - 10.5 K/uL 5.9 8.4 7.6  Hemoglobin 13.0 - 17.0 g/dL 11.5(L) 11.9(L) 12.4(L)  Hematocrit 39.0 - 52.0 % 32.5(L) 34.3(L) 35.7(L)  Platelets 150 - 400 K/uL 118(L) 151 213     CMP Latest Ref Rng & Units 01/17/2021 01/10/2021 01/05/2021  Glucose 70 - 99 mg/dL 94 95 104(H)  BUN 6 - 20 mg/dL 15 19 14   Creatinine 0.61 - 1.24 mg/dL 1.09 1.20 1.16  Sodium 135 -  145 mmol/L 137 135 138  Potassium 3.5 - 5.1 mmol/L 4.4 3.5 3.7  Chloride 98 - 111 mmol/L 102 102 101  CO2 22 - 32 mmol/L 26 28 27   Calcium 8.9 - 10.3 mg/dL 9.1 8.4(L) 9.4      RADIOGRAPHIC STUDIES: I have personally reviewed the radiological images as listed and agreed with the findings in the report. No results found.   ASSESSMENT & PLAN: 51 year old male  1.  Squamous cell carcinoma of oropharynx, HPV positive, T2 N1 M0 -Began concurrent chemoradiation with weekly cisplatin 12/28/2020, s/p week 3 -Tolerating chemo with tinnitus and fatigue -Followed by dietitian for dysphagia and weight loss, G tube fed  -symptom management and supportive care for n/v, pain, dehydration   Disposition:  Mr. Spadaccini appears stable. S/p week 3 ccRT with weekly cisplatin. He is tolerating chemo with tinnitus and fatigue. He is able to recover and function. Unfortunately he has developed worsening side effects of radiation. He has progressive throat pain and dysphagia with 10 lbs weight loss since last week, and difficulty maintaining adequate hydration and nutrition. He has n/v with phlegm and after tube feedings, possible also  chemo related, only partially managed with compazine and zofran.   For weight loss and dysphagia, I spoke to Dory Peru today who will see him 6/28 with chemo and adjust his diet. I recommend mid-week IVF and again on Saturday as scheduled.   For pain, begin MS contin BID and continue percocet PRN for breakthrough. I offered carafate and he declined.   For n/v, continue alternating zofran and compazine, and add ativan for refractory n/v (Rx sent today).   All questions were answered. The patient knows to call the clinic with any problems, questions or concerns. No barriers to learning were detected. Total encounter time was 30 minutes.      Alla Feeling, NP 01/17/21

## 2021-01-17 ENCOUNTER — Other Ambulatory Visit: Payer: Self-pay | Admitting: Hematology and Oncology

## 2021-01-17 ENCOUNTER — Inpatient Hospital Stay: Payer: Managed Care, Other (non HMO)

## 2021-01-17 ENCOUNTER — Other Ambulatory Visit: Payer: Self-pay

## 2021-01-17 ENCOUNTER — Encounter: Payer: Managed Care, Other (non HMO) | Admitting: Nutrition

## 2021-01-17 ENCOUNTER — Telehealth: Payer: Self-pay | Admitting: Nutrition

## 2021-01-17 ENCOUNTER — Encounter: Payer: Self-pay | Admitting: Nurse Practitioner

## 2021-01-17 ENCOUNTER — Ambulatory Visit
Admission: RE | Admit: 2021-01-17 | Discharge: 2021-01-17 | Disposition: A | Discharge status: Home / Self Care | Payer: Managed Care, Other (non HMO) | Source: Ambulatory Visit | Attending: Radiation Oncology | Admitting: Radiation Oncology

## 2021-01-17 ENCOUNTER — Inpatient Hospital Stay (HOSPITAL_BASED_OUTPATIENT_CLINIC_OR_DEPARTMENT_OTHER): Payer: Managed Care, Other (non HMO) | Admitting: Nurse Practitioner

## 2021-01-17 VITALS — BP 129/82 | HR 64 | Temp 98.3°F | Resp 18 | Wt 232.3 lb

## 2021-01-17 DIAGNOSIS — C109 Malignant neoplasm of oropharynx, unspecified: Secondary | ICD-10-CM

## 2021-01-17 DIAGNOSIS — R1310 Dysphagia, unspecified: Secondary | ICD-10-CM | POA: Diagnosis not present

## 2021-01-17 DIAGNOSIS — R293 Abnormal posture: Secondary | ICD-10-CM | POA: Diagnosis not present

## 2021-01-17 LAB — CBC WITH DIFFERENTIAL (CANCER CENTER ONLY)
Abs Immature Granulocytes: 0.02 10*3/uL (ref 0.00–0.07)
Basophils Absolute: 0 10*3/uL (ref 0.0–0.1)
Basophils Relative: 0 %
Eosinophils Absolute: 0.1 10*3/uL (ref 0.0–0.5)
Eosinophils Relative: 1 %
HCT: 32.5 % — ABNORMAL LOW (ref 39.0–52.0)
Hemoglobin: 11.5 g/dL — ABNORMAL LOW (ref 13.0–17.0)
Immature Granulocytes: 0 %
Lymphocytes Relative: 10 %
Lymphs Abs: 0.6 10*3/uL — ABNORMAL LOW (ref 0.7–4.0)
MCH: 33.1 pg (ref 26.0–34.0)
MCHC: 35.4 g/dL (ref 30.0–36.0)
MCV: 93.7 fL (ref 80.0–100.0)
Monocytes Absolute: 0.4 10*3/uL (ref 0.1–1.0)
Monocytes Relative: 7 %
Neutro Abs: 4.8 10*3/uL (ref 1.7–7.7)
Neutrophils Relative %: 82 %
Platelet Count: 118 10*3/uL — ABNORMAL LOW (ref 150–400)
RBC: 3.47 MIL/uL — ABNORMAL LOW (ref 4.22–5.81)
RDW: 12.3 % (ref 11.5–15.5)
WBC Count: 5.9 10*3/uL (ref 4.0–10.5)
nRBC: 0 % (ref 0.0–0.2)

## 2021-01-17 LAB — BASIC METABOLIC PANEL - CANCER CENTER ONLY
Anion gap: 9 (ref 5–15)
BUN: 15 mg/dL (ref 6–20)
CO2: 26 mmol/L (ref 22–32)
Calcium: 9.1 mg/dL (ref 8.9–10.3)
Chloride: 102 mmol/L (ref 98–111)
Creatinine: 1.09 mg/dL (ref 0.61–1.24)
GFR, Estimated: >60 mL/min (ref >60)
Glucose, Bld: 94 mg/dL (ref 70–99)
Potassium: 4.4 mmol/L (ref 3.5–5.1)
Sodium: 137 mmol/L (ref 135–145)

## 2021-01-17 LAB — MAGNESIUM: Magnesium: 2.1 mg/dL (ref 1.7–2.4)

## 2021-01-17 MED ORDER — LORAZEPAM 0.5 MG PO TABS: 0.5000 mg | ORAL_TABLET | Freq: Three times a day (TID) | ORAL | 0 refills | Status: DC | PRN

## 2021-01-17 MED ORDER — HEPARIN SOD (PORK) LOCK FLUSH 100 UNIT/ML IV SOLN
500.0000 [IU] | Freq: Once | INTRAVENOUS | Status: AC | PRN
  Administered 2021-01-17: 500 [IU]
  Filled 2021-01-17: qty 5

## 2021-01-17 MED ORDER — SODIUM CHLORIDE 0.9% FLUSH
10.0000 mL | Freq: Once | INTRAVENOUS | Status: AC | PRN
  Administered 2021-01-17: 10 mL
  Filled 2021-01-17: qty 10

## 2021-01-17 NOTE — Telephone Encounter (Signed)
Patient reports difficulty tolerating goal rate of Osmolite 1.5.  He has not been able to eat since last week so he has started to use Osmolite 1.5 through his feeding tube.  He reports he can tolerate 1 carton at a time every 4-5 hours getting between 4 and 5 cartons daily.  He is using room temperature formula.  He is not flushing his feeding tube with water prior to giving his feeding but does flush with 60 mL of water after feeding.  Reports he gives Osmolite 1.5 over 3 and 5 minutes from start to finish. (This is too fast.)  Reports nausea and vomiting and increased mucus/thickened saliva.  He is waiting on his pain medication to come in.  Reports he is giving 8 ounces of water frequently throughout the day.  Weight decreased and documented as 232 pounds down from 242.2 pounds June 20.  Labs were reviewed.  Patient does not appear dehydrated.  Estimated nutrition needs: 2600-2800 cal, 130-145 g protein, 2.8 L fluid.  Nutrition diagnosis: Predicted suboptimal energy intake has evolved into inadequate oral intake related to tonsil cancer and associated treatments as evidenced by 10 pound weight loss over 1 week and need for tube feeding.  Intervention: Educated patient to give Osmolite 1.5 at room temperature, 1 carton over 10-15 minutes.  Flush with 60 mL free water after bolus feeding.  Give 1 carton every 3 hours while awake. Can give an additional 240 mL free water an hour and a half after tube feeding and before next feeding. Continue baking soda and salt water rinses to decrease thickened saliva. Take nausea medication as needed.  Monitoring, evaluation, goals: Patient will tolerate adequate calories and protein to minimize weight loss. Will evaluate tolerance of Osmolite 1.5.  Consider changing tube feeding if patient unable to tolerate adequate calories and protein.  Next visit: Tuesday, June 28 during infusion.  **Disclaimer: This note was dictated with voice recognition software.  Similar sounding words can inadvertently be transcribed and this note may contain transcription errors which may not have been corrected upon publication of note.**

## 2021-01-18 ENCOUNTER — Inpatient Hospital Stay: Payer: Managed Care, Other (non HMO) | Admitting: Nutrition

## 2021-01-18 ENCOUNTER — Ambulatory Visit
Admission: RE | Admit: 2021-01-18 | Discharge: 2021-01-18 | Disposition: A | Discharge status: Home / Self Care | Payer: Managed Care, Other (non HMO) | Source: Ambulatory Visit | Attending: Radiation Oncology | Admitting: Radiation Oncology

## 2021-01-18 ENCOUNTER — Telehealth: Payer: Self-pay | Admitting: Hematology

## 2021-01-18 ENCOUNTER — Inpatient Hospital Stay: Payer: Managed Care, Other (non HMO)

## 2021-01-18 VITALS — BP 106/72 | HR 82 | Temp 98.3°F | Resp 18

## 2021-01-18 DIAGNOSIS — R293 Abnormal posture: Secondary | ICD-10-CM | POA: Diagnosis not present

## 2021-01-18 DIAGNOSIS — C109 Malignant neoplasm of oropharynx, unspecified: Secondary | ICD-10-CM

## 2021-01-18 DIAGNOSIS — R1310 Dysphagia, unspecified: Secondary | ICD-10-CM | POA: Diagnosis not present

## 2021-01-18 MED ORDER — SODIUM CHLORIDE 0.9 % IV SOLN
40.0000 mg/m2 | Freq: Once | INTRAVENOUS | Status: AC
  Administered 2021-01-18: 94 mg via INTRAVENOUS
  Filled 2021-01-18: qty 94

## 2021-01-18 MED ORDER — PALONOSETRON HCL INJECTION 0.25 MG/5ML
INTRAVENOUS | Status: AC
  Filled 2021-01-18: qty 5

## 2021-01-18 MED ORDER — SODIUM CHLORIDE 0.9 % IV SOLN
10.0000 mg | Freq: Once | INTRAVENOUS | Status: AC
  Administered 2021-01-18: 10 mg via INTRAVENOUS
  Filled 2021-01-18: qty 10

## 2021-01-18 MED ORDER — MAGNESIUM SULFATE 2 GM/50ML IV SOLN
INTRAVENOUS | Status: AC
  Filled 2021-01-18: qty 50

## 2021-01-18 MED ORDER — SODIUM CHLORIDE 0.9 % IV SOLN
Freq: Once | INTRAVENOUS | Status: AC
Start: 2021-01-18 — End: 2021-01-18
  Filled 2021-01-18: qty 250

## 2021-01-18 MED ORDER — POTASSIUM CHLORIDE IN NACL 20-0.9 MEQ/L-% IV SOLN
Freq: Once | INTRAVENOUS | Status: AC
Start: 2021-01-18 — End: 2021-01-18
  Filled 2021-01-18: qty 1000

## 2021-01-18 MED ORDER — PALONOSETRON HCL INJECTION 0.25 MG/5ML
0.2500 mg | Freq: Once | INTRAVENOUS | Status: AC
  Administered 2021-01-18: 0.25 mg via INTRAVENOUS

## 2021-01-18 MED ORDER — SODIUM CHLORIDE 0.9% FLUSH
10.0000 mL | INTRAVENOUS | Status: DC | PRN
Start: 2021-01-18 — End: 2021-01-18
  Administered 2021-01-18: 10 mL
  Filled 2021-01-18: qty 10

## 2021-01-18 MED ORDER — HEPARIN SOD (PORK) LOCK FLUSH 100 UNIT/ML IV SOLN
500.0000 [IU] | Freq: Once | INTRAVENOUS | Status: AC | PRN
  Administered 2021-01-18: 500 [IU]
  Filled 2021-01-18: qty 5

## 2021-01-18 MED ORDER — SODIUM CHLORIDE 0.9 % IV SOLN
150.0000 mg | Freq: Once | INTRAVENOUS | Status: AC
  Administered 2021-01-18: 150 mg via INTRAVENOUS
  Filled 2021-01-18: qty 150

## 2021-01-18 MED ORDER — MAGNESIUM SULFATE 2 GM/50ML IV SOLN
2.0000 g | Freq: Once | INTRAVENOUS | Status: AC
  Administered 2021-01-18: 2 g via INTRAVENOUS

## 2021-01-18 NOTE — Progress Notes (Signed)
Nutrition follow-up completed with patient during infusion for tonsil cancer. Patient reports he is tolerating 1 carton Osmolite 1.5 via his feeding tube at 1 time.  Reports getting between 4 and 5 cartons of Osmolite 1.5 daily.  He is flushing feeding tube with 60 mL of free water before and after bolus feeding.  He has started to give his tube feeding a bit slower as educated yesterday on the telephone.  He thinks he is tolerating his feedings better.  Weight documented is 232.3 pounds June 27 which is down approximately 10 pounds from 242.8 pounds June 20.  Labs noted.  Estimated nutrition needs: 2600-2800 cal, 130-145 g protein, 2.8 L fluid.  Nutrition diagnosis: Inadequate oral intake continues.  Intervention: Increase Osmolite 1.5-1-1/2 cartons over 15 to 20 minutes with 60 mL free water before and after bolus feeding.  Give an additional 240 mL free water 3 times daily between feedings. Continue baking soda and salt water rinses as tolerated. Continue nausea medication as needed. Provided an additional sample case of Osmolite 1.5 this patient was having difficulty getting product delivered. Questions answered.  Teach back method used.  Tube feeding goal: 7 cartons Osmolite 1.5 split into 4 feedings +45 mL Prosource tube feeding 3 times a day.  Give 60 mL free water flush before and after bolus feeding.  30 mL flush before and after Prosource.  Give additional 240 mL free water 3 times a day.  Provides 2620 cal, 137.3 g protein, 2647 mL free water/100% estimated needs.  Monitoring, evaluation, goals: Patient will tolerate tube feeding to meet estimated calorie and protein needs to minimize weight loss.  Next visit: Wednesday, July 6 during infusion.  **Disclaimer: This note was dictated with voice recognition software. Similar sounding words can inadvertently be transcribed and this note may contain transcription errors which may not have been corrected upon publication of note.**

## 2021-01-18 NOTE — Patient Instructions (Signed)
[facility]  Discharge Instructions: Thank you for choosing [facility] to provide your oncology and hematology care.   If you have a lab appointment with the Cancer Center, please go directly to the Cancer Center and check in at the registration area.   Wear comfortable clothing and clothing appropriate for easy access to any Portacath or PICC line.   We strive to give you quality time with your provider. You may need to reschedule your appointment if you arrive late (15 or more minutes).  Arriving late affects you and other patients whose appointments are after yours.  Also, if you miss three or more appointments without notifying the office, you may be dismissed from the clinic at the provider's discretion.      For prescription refill requests, have your pharmacy contact our office and allow 72 hours for refills to be completed.    Today you received the following chemotherapy and/or immunotherapy agents cisplatin   To help prevent nausea and vomiting after your treatment, we encourage you to take your nausea medication as directed.  BELOW ARE SYMPTOMS THAT SHOULD BE REPORTED IMMEDIATELY: *FEVER GREATER THAN 100.4 F (38 C) OR HIGHER *CHILLS OR SWEATING *NAUSEA AND VOMITING THAT IS NOT CONTROLLED WITH YOUR NAUSEA MEDICATION *UNUSUAL SHORTNESS OF BREATH *UNUSUAL BRUISING OR BLEEDING *URINARY PROBLEMS (pain or burning when urinating, or frequent urination) *BOWEL PROBLEMS (unusual diarrhea, constipation, pain near the anus) TENDERNESS IN MOUTH AND THROAT WITH OR WITHOUT PRESENCE OF ULCERS (sore throat, sores in mouth, or a toothache) UNUSUAL RASH, SWELLING OR PAIN  UNUSUAL VAGINAL DISCHARGE OR ITCHING   Items with * indicate a potential emergency and should be followed up as soon as possible or go to the Emergency Department if any problems should occur.  Please show the CHEMOTHERAPY ALERT CARD or IMMUNOTHERAPY ALERT CARD at check-in to the  Emergency Department and triage nurse.  Should you have questions after your visit or need to cancel or reschedule your appointment, please contact [facility]  Dept: [phone]  and follow the prompts.  Office hours are 8:00 a.m. to 4:30 p.m. Monday - Friday. Please note that voicemails left after 4:00 p.m. may not be returned until the following business day.  We are closed weekends and major holidays. You have access to a nurse at all times for urgent questions. Please call the main number to the clinic Dept: [phone] and follow the prompts.   For any non-urgent questions, you may also contact your provider using MyChart. We now offer e-Visits for anyone 18 and older to request care online for non-urgent symptoms. For details visit [email or web address].   Also download the MyChart app! Go to the app store, search "MyChart", open the app, select [facility], and log in with your MyChart username and password.  Due to Covid, a mask is required upon entering the hospital/clinic. If you do not have a mask, one will be given to you upon arrival. For doctor visits, patients may have 1 support person aged 18 or older with them. For treatment visits, patients cannot have anyone with them due to current Covid guidelines and our immunocompromised population.   

## 2021-01-18 NOTE — Telephone Encounter (Signed)
Left message with follow-up appointment per 6/27 los. 

## 2021-01-19 ENCOUNTER — Ambulatory Visit
Admission: RE | Admit: 2021-01-19 | Discharge: 2021-01-19 | Disposition: A | Discharge status: Home / Self Care | Payer: Managed Care, Other (non HMO) | Source: Ambulatory Visit | Attending: Radiation Oncology | Admitting: Radiation Oncology

## 2021-01-19 ENCOUNTER — Other Ambulatory Visit: Payer: Self-pay

## 2021-01-19 DIAGNOSIS — R293 Abnormal posture: Secondary | ICD-10-CM | POA: Diagnosis not present

## 2021-01-19 DIAGNOSIS — R1310 Dysphagia, unspecified: Secondary | ICD-10-CM | POA: Diagnosis not present

## 2021-01-20 ENCOUNTER — Ambulatory Visit
Admission: RE | Admit: 2021-01-20 | Discharge: 2021-01-20 | Disposition: A | Discharge status: Home / Self Care | Payer: Managed Care, Other (non HMO) | Source: Ambulatory Visit | Attending: Radiation Oncology | Admitting: Radiation Oncology

## 2021-01-20 ENCOUNTER — Inpatient Hospital Stay: Payer: Managed Care, Other (non HMO)

## 2021-01-20 VITALS — BP 100/64 | HR 64 | Temp 98.3°F | Resp 18

## 2021-01-20 DIAGNOSIS — R1310 Dysphagia, unspecified: Secondary | ICD-10-CM | POA: Diagnosis not present

## 2021-01-20 DIAGNOSIS — C109 Malignant neoplasm of oropharynx, unspecified: Secondary | ICD-10-CM

## 2021-01-20 DIAGNOSIS — R293 Abnormal posture: Secondary | ICD-10-CM | POA: Diagnosis not present

## 2021-01-20 MED ORDER — SODIUM CHLORIDE 0.9 % IV SOLN
Freq: Once | INTRAVENOUS | Status: AC
  Filled 2021-01-20: qty 250

## 2021-01-20 MED ORDER — SODIUM CHLORIDE 0.9% FLUSH
10.0000 mL | Freq: Once | INTRAVENOUS | Status: AC | PRN
  Administered 2021-01-20: 10 mL
  Filled 2021-01-20: qty 10

## 2021-01-20 MED ORDER — HEPARIN SOD (PORK) LOCK FLUSH 100 UNIT/ML IV SOLN
500.0000 [IU] | Freq: Once | INTRAVENOUS | Status: AC | PRN
  Administered 2021-01-20: 500 [IU]
  Filled 2021-01-20: qty 5

## 2021-01-20 NOTE — Patient Instructions (Signed)
[facility]  Discharge Instructions: Thank you for choosing [facility] to provide your oncology and hematology care.   If you have a lab appointment with the [facility], please go directly to the [facility] and check in at the registration area.   Wear comfortable clothing and clothing appropriate for easy access to any Portacath or PICC line.   We strive to give you quality time with your provider. You may need to reschedule your appointment if you arrive late (15 or more minutes).  Arriving late affects you and other patients whose appointments are after yours.  Also, if you miss three or more appointments without notifying the office, you may be dismissed from the clinic at the provider's discretion.      For prescription refill requests, have your pharmacy contact our office and allow 72 hours for refills to be completed.    Today you received the following chemotherapy and/or immunotherapy agents Normal Saline Bolus/hydration.      To help prevent nausea and vomiting after your treatment, we encourage you to take your nausea medication as directed.  BELOW ARE SYMPTOMS THAT SHOULD BE REPORTED IMMEDIATELY: *FEVER GREATER THAN 100.4 F (38 C) OR HIGHER *CHILLS OR SWEATING *NAUSEA AND VOMITING THAT IS NOT CONTROLLED WITH YOUR NAUSEA MEDICATION *UNUSUAL SHORTNESS OF BREATH *UNUSUAL BRUISING OR BLEEDING *URINARY PROBLEMS (pain or burning when urinating, or frequent urination) *BOWEL PROBLEMS (unusual diarrhea, constipation, pain near the anus) TENDERNESS IN MOUTH AND THROAT WITH OR WITHOUT PRESENCE OF ULCERS (sore throat, sores in mouth, or a toothache) UNUSUAL RASH, SWELLING OR PAIN  UNUSUAL VAGINAL DISCHARGE OR ITCHING   Items with * indicate a potential emergency and should be followed up as soon as possible or go to the Emergency Department if any problems should occur.  Please show the CHEMOTHERAPY ALERT CARD or IMMUNOTHERAPY ALERT  CARD at check-in to the Emergency Department and triage nurse.  Should you have questions after your visit or need to cancel or reschedule your appointment, please contact [facility]  Dept: [phone]  and follow the prompts.  Office hours are 8:00 a.m. to 4:30 p.m. Monday - Friday. Please note that voicemails left after 4:00 p.m. may not be returned until the following business day.  We are closed weekends and major holidays. You have access to a nurse at all times for urgent questions. Please call the main number to the clinic Dept: [phone] and follow the prompts.   For any non-urgent questions, you may also contact your provider using MyChart. We now offer e-Visits for anyone 50 and older to request care online for non-urgent symptoms. For details visit [email or web address].   Also download the MyChart app! Go to the app store, search "MyChart", open the app, select [facility], and log in with your MyChart username and password.  Due to Covid, a mask is required upon entering the hospital/clinic. If you do not have a mask, one will be given to you upon arrival. For doctor visits, patients may have 1 support person aged 52 or older with them. For treatment visits, patients cannot have anyone with them due to current Covid guidelines and our immunocompromised population.

## 2021-01-21 ENCOUNTER — Ambulatory Visit
Admission: RE | Admit: 2021-01-21 | Discharge: 2021-01-21 | Disposition: A | Discharge status: Home / Self Care | Payer: Managed Care, Other (non HMO) | Source: Ambulatory Visit | Attending: Radiation Oncology | Admitting: Radiation Oncology

## 2021-01-21 ENCOUNTER — Other Ambulatory Visit: Payer: Self-pay

## 2021-01-21 DIAGNOSIS — C109 Malignant neoplasm of oropharynx, unspecified: Secondary | ICD-10-CM | POA: Diagnosis present

## 2021-01-21 DIAGNOSIS — Z510 Encounter for antineoplastic radiation therapy: Secondary | ICD-10-CM | POA: Diagnosis not present

## 2021-01-22 ENCOUNTER — Inpatient Hospital Stay: Payer: Managed Care, Other (non HMO)

## 2021-01-22 VITALS — BP 117/78 | HR 100 | Temp 98.7°F | Resp 20 | Ht 70.0 in

## 2021-01-22 DIAGNOSIS — R112 Nausea with vomiting, unspecified: Secondary | ICD-10-CM | POA: Insufficient documentation

## 2021-01-22 DIAGNOSIS — G893 Neoplasm related pain (acute) (chronic): Secondary | ICD-10-CM | POA: Insufficient documentation

## 2021-01-22 DIAGNOSIS — L599 Disorder of the skin and subcutaneous tissue related to radiation, unspecified: Secondary | ICD-10-CM | POA: Insufficient documentation

## 2021-01-22 DIAGNOSIS — D61810 Antineoplastic chemotherapy induced pancytopenia: Secondary | ICD-10-CM | POA: Insufficient documentation

## 2021-01-22 DIAGNOSIS — Z5111 Encounter for antineoplastic chemotherapy: Secondary | ICD-10-CM | POA: Insufficient documentation

## 2021-01-22 DIAGNOSIS — T451X5A Adverse effect of antineoplastic and immunosuppressive drugs, initial encounter: Secondary | ICD-10-CM | POA: Insufficient documentation

## 2021-01-22 DIAGNOSIS — C109 Malignant neoplasm of oropharynx, unspecified: Secondary | ICD-10-CM | POA: Insufficient documentation

## 2021-01-22 DIAGNOSIS — R293 Abnormal posture: Secondary | ICD-10-CM | POA: Insufficient documentation

## 2021-01-22 DIAGNOSIS — Z510 Encounter for antineoplastic radiation therapy: Secondary | ICD-10-CM | POA: Diagnosis not present

## 2021-01-22 DIAGNOSIS — Z79899 Other long term (current) drug therapy: Secondary | ICD-10-CM | POA: Insufficient documentation

## 2021-01-22 DIAGNOSIS — R600 Localized edema: Secondary | ICD-10-CM | POA: Insufficient documentation

## 2021-01-22 DIAGNOSIS — R1310 Dysphagia, unspecified: Secondary | ICD-10-CM | POA: Insufficient documentation

## 2021-01-22 DIAGNOSIS — C090 Malignant neoplasm of tonsillar fossa: Secondary | ICD-10-CM | POA: Insufficient documentation

## 2021-01-22 MED ORDER — SODIUM CHLORIDE 0.9 % IV SOLN
Freq: Once | INTRAVENOUS | Status: AC
  Filled 2021-01-22: qty 250

## 2021-01-22 MED ORDER — SODIUM CHLORIDE 0.9% FLUSH
10.0000 mL | Freq: Once | INTRAVENOUS | Status: AC | PRN
  Administered 2021-01-22: 10 mL
  Filled 2021-01-22: qty 10

## 2021-01-22 MED ORDER — HEPARIN SOD (PORK) LOCK FLUSH 100 UNIT/ML IV SOLN
500.0000 [IU] | Freq: Once | INTRAVENOUS | Status: AC | PRN
  Administered 2021-01-22: 500 [IU]
  Filled 2021-01-22: qty 5

## 2021-01-22 MED ORDER — ALTEPLASE 2 MG IJ SOLR
2.0000 mg | Freq: Once | INTRAMUSCULAR | Status: DC | PRN
  Filled 2021-01-22: qty 2

## 2021-01-22 NOTE — Patient Instructions (Signed)

## 2021-01-25 ENCOUNTER — Inpatient Hospital Stay (HOSPITAL_BASED_OUTPATIENT_CLINIC_OR_DEPARTMENT_OTHER): Payer: Managed Care, Other (non HMO) | Admitting: Hematology and Oncology

## 2021-01-25 ENCOUNTER — Other Ambulatory Visit: Payer: Self-pay

## 2021-01-25 ENCOUNTER — Encounter: Payer: Self-pay | Admitting: Hematology and Oncology

## 2021-01-25 ENCOUNTER — Inpatient Hospital Stay: Payer: Managed Care, Other (non HMO)

## 2021-01-25 ENCOUNTER — Ambulatory Visit
Admission: RE | Admit: 2021-01-25 | Discharge: 2021-01-25 | Disposition: A | Discharge status: Home / Self Care | Payer: Managed Care, Other (non HMO) | Source: Ambulatory Visit | Attending: Radiation Oncology | Admitting: Radiation Oncology

## 2021-01-25 DIAGNOSIS — H9319 Tinnitus, unspecified ear: Secondary | ICD-10-CM | POA: Diagnosis not present

## 2021-01-25 DIAGNOSIS — C109 Malignant neoplasm of oropharynx, unspecified: Secondary | ICD-10-CM | POA: Diagnosis not present

## 2021-01-25 DIAGNOSIS — G893 Neoplasm related pain (acute) (chronic): Secondary | ICD-10-CM | POA: Diagnosis not present

## 2021-01-25 DIAGNOSIS — R634 Abnormal weight loss: Secondary | ICD-10-CM | POA: Diagnosis not present

## 2021-01-25 DIAGNOSIS — Z510 Encounter for antineoplastic radiation therapy: Secondary | ICD-10-CM | POA: Diagnosis not present

## 2021-01-25 LAB — BASIC METABOLIC PANEL - CANCER CENTER ONLY
Anion gap: 9 (ref 5–15)
BUN: 23 mg/dL — ABNORMAL HIGH (ref 6–20)
CO2: 26 mmol/L (ref 22–32)
Calcium: 9.3 mg/dL (ref 8.9–10.3)
Chloride: 102 mmol/L (ref 98–111)
Creatinine: 1.04 mg/dL (ref 0.61–1.24)
GFR, Estimated: >60 mL/min (ref >60)
Glucose, Bld: 188 mg/dL — ABNORMAL HIGH (ref 70–99)
Potassium: 4.6 mmol/L (ref 3.5–5.1)
Sodium: 137 mmol/L (ref 135–145)

## 2021-01-25 LAB — CBC WITH DIFFERENTIAL (CANCER CENTER ONLY)
Abs Immature Granulocytes: 0.01 10*3/uL (ref 0.00–0.07)
Basophils Absolute: 0 10*3/uL (ref 0.0–0.1)
Basophils Relative: 0 %
Eosinophils Absolute: 0 10*3/uL (ref 0.0–0.5)
Eosinophils Relative: 0 %
HCT: 26.5 % — ABNORMAL LOW (ref 39.0–52.0)
Hemoglobin: 9.4 g/dL — ABNORMAL LOW (ref 13.0–17.0)
Immature Granulocytes: 0 %
Lymphocytes Relative: 6 %
Lymphs Abs: 0.2 10*3/uL — ABNORMAL LOW (ref 0.7–4.0)
MCH: 32.5 pg (ref 26.0–34.0)
MCHC: 35.5 g/dL (ref 30.0–36.0)
MCV: 91.7 fL (ref 80.0–100.0)
Monocytes Absolute: 0.1 10*3/uL (ref 0.1–1.0)
Monocytes Relative: 2 %
Neutro Abs: 3.6 10*3/uL (ref 1.7–7.7)
Neutrophils Relative %: 92 %
Platelet Count: 102 10*3/uL — ABNORMAL LOW (ref 150–400)
RBC: 2.89 MIL/uL — ABNORMAL LOW (ref 4.22–5.81)
RDW: 12.3 % (ref 11.5–15.5)
WBC Count: 3.9 10*3/uL — ABNORMAL LOW (ref 4.0–10.5)
nRBC: 0 % (ref 0.0–0.2)

## 2021-01-25 LAB — MAGNESIUM: Magnesium: 1.9 mg/dL (ref 1.7–2.4)

## 2021-01-25 MED ORDER — SODIUM CHLORIDE 0.9% FLUSH
10.0000 mL | Freq: Once | INTRAVENOUS | Status: AC | PRN
  Administered 2021-01-25: 10 mL
  Filled 2021-01-25: qty 10

## 2021-01-25 MED ORDER — HEPARIN SOD (PORK) LOCK FLUSH 100 UNIT/ML IV SOLN
500.0000 [IU] | Freq: Once | INTRAVENOUS | Status: AC | PRN
  Administered 2021-01-25: 500 [IU]
  Filled 2021-01-25: qty 5

## 2021-01-25 NOTE — Patient Instructions (Signed)

## 2021-01-25 NOTE — Assessment & Plan Note (Signed)
On MS Contin 15 mg p.o. twice daily for long-acting pain medication with breakthrough oxycodone which he needs about 1 a day.

## 2021-01-25 NOTE — Assessment & Plan Note (Signed)
He says it may be a bit worse and asymmetrical compared to when we initiated cisplatin.  No associated hearing loss.  We will proceed with the current dose as planned.

## 2021-01-25 NOTE — Addendum Note (Signed)
Addended by: Adaline Sill on: 01/25/2021 03:18 PM   Modules accepted: Orders

## 2021-01-25 NOTE — Assessment & Plan Note (Signed)
He is currently fed through G-tube.  Lost about 3 pounds since last visit.  Encouraged to keep up with the G-tube nutrition and hydration.

## 2021-01-25 NOTE — Assessment & Plan Note (Signed)
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 He completed 4 weekly cycles of cisplatin.  Review of systems with ongoing nausea, mucositis and some mild weight loss.  Physical examination unremarkable except for some mucositis. We have reviewed CBC, satisfactory to proceed.  BMP and magnesium pending at the time of my visit.  Okay to proceed if labs are within parameters. He was wondering if we need to do 7 cycles of cisplatin versus 6 weekly cycles.  If he tolerates the chemotherapy well, then we will proceed with week 7.  If he has any difficulty with chemotherapy, we can omit cycle 7.

## 2021-01-25 NOTE — Progress Notes (Signed)
[name]   Telephone:([phone] Fax:([phone]   Clinic Follow up Note   Patient Care Team: Pa, [place] as PCP - General (Family Medicine) [place], Stephani Police, RN as Oncology Nurse Navigator Eppie Gibson, MD as Consulting Physician (Radiation Oncology) Benay Pike, MD as Consulting Physician (Hematology and Oncology) Leta Baptist, MD as Consulting Physician (Otolaryngology) 01/25/2021  CHIEF COMPLAINT: Follow up HPV+ scc of oropharynx   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[name]) This is a very pleasant 50 year old male patient with no significant past medical history recently 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 He completed 4 weekly cycles of cisplatin.  Review of systems with ongoing nausea, mucositis and some mild weight loss.  Physical examination unremarkable except for some mucositis. We have reviewed CBC, satisfactory to proceed.  BMP and magnesium pending at the time of my visit.  Okay to proceed if labs are within parameters. He was wondering if we need to do 7 cycles of cisplatin versus 6 weekly cycles.  If he tolerates the chemotherapy well, then we will proceed with week 7.  If he has any difficulty with chemotherapy, we can omit cycle 7.  Neoplasm related pain On MS Contin 15 mg p.o. twice daily for long-acting pain medication with breakthrough oxycodone which he needs about 1 a day.  Weight loss, unintentional He is currently fed through G-tube.  Lost about 3 pounds since last visit.  Encouraged to keep up with the G-tube nutrition and hydration.  Tinnitus He says it may be a bit worse and asymmetrical compared to when we initiated cisplatin.  No associated hearing loss.  We will proceed with the current dose as planned.   SUMMARY OF ONCOLOGIC HISTORY: Oncology History Overview Note  50 yr old male who has  experiencing chronic sore throat for the past few months. He underwent a CT neck which showed primarily a submucosal 3 cm mass within the left tonsillar fossa also noted to have 3.2 cm left jugulodigastric lymph node, findings were concerning for left tonsillar malignancy and malignant lymph node. He underwent left tonsillectomy by Dr. [name] and pathology showed invasive squamous cell carcinoma, HPV mediated.  He He had PET imaging which showed evidence of left tonsillar cancer and left ipsilateral lymph nodes, from my review, no evidence of distant metastasis, I could not see an official report on the PET imaging. He is here for initial recommendations.   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[place])  12/06/2020 Initial Diagnosis   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[place])   12/06/2020 Cancer Staging   Staging form: Pharynx - HPV-Mediated Oropharynx, AJCC 8th Edition - Clinical stage from 12/06/2020: Stage I (cT2, cN1, cM0, p16+) - Signed by Benay Pike, MD on 12/06/2020  Stage prefix: Initial diagnosis    12/27/2020 -  Chemotherapy    Patient is on Treatment Plan: HEAD/NECK CISPLATIN Q7D       Cancer of tonsillar fossa ([place])  12/07/2020 Initial Diagnosis   Cancer of tonsillar fossa ([place])    12/07/2020 Cancer Staging   Staging form: Pharynx - HPV-Mediated Oropharynx, AJCC 8th Edition - Clinical stage from 12/07/2020: Stage I (cT2, cN1, cM0, p16+) - Signed by Eppie Gibson, MD on 12/07/2020  Stage prefix: Initial diagnosis      CURRENT THERAPY: chemoRT with weekly cisplatin   INTERVAL HISTORY:   [name] is here for follow-up before cycle 1 day 5 of weekly  cisplatin.  Since his last visit, he lost about 3 pounds of weight.  He notices that the secretions in his mouth make him gag and he has been given dexamethasone daily which has been helping him tremendously.  Besides the gag and 1 episode of nausea every day, no vomiting.  No fevers or chills.  He does report ongoing tinnitus  without definitive hearing loss.  Very rare episode of tingling while he is getting the infusion, no residual tingling or numbness.  He has been using the PEG tube for feeding.  Port site appears well without any pain.  No change in breathing no change in bowel or urinary habits.  He has been taking morphine twice a day and oxycodone about once a day for breakthrough pain.  MEDICAL HISTORY:  Past Medical History:  Diagnosis Date   Sleep apnea    Tonsillar mass 11/16/2020    SURGICAL HISTORY: Past Surgical History:  Procedure Laterality Date   DIRECT LARYNGOSCOPY N/A 11/22/2020   Procedure: DIRECT LARYNGOSCOPY WITH BIOPSY;  Surgeon: Leta Baptist, MD;  Location: [facility];  Service: ENT;  Laterality: N/A;   FOOT SURGERY  2000   extra nivicular removal   IR GASTROSTOMY TUBE MOD SED  12/24/2020   IR IMAGING GUIDED PORT INSERTION  12/24/2020   SPINE SURGERY     TONSILLECTOMY Left 11/22/2020   Procedure: LEFT TONSILLECTOMY;  Surgeon: Leta Baptist, MD;  Location: [facility];  Service: ENT;  Laterality: Left;    I have reviewed the social history and family history with the patient and they are unchanged from previous note.  ALLERGIES:  has No Known Allergies.  MEDICATIONS:  Current Outpatient Medications  Medication Sig Dispense Refill   dexamethasone (DECADRON) 4 MG tablet Take 2 tablets (8 mg total) by mouth daily. Take daily x 3 days starting the day after cisplatin chemotherapy. Take with food. 30 tablet 1   lidocaine (XYLOCAINE) 2 % solution Patient: Mix 1part 2% viscous lidocaine, 1part H20. Swish & swallow 76mL of diluted mixture, 4min before meals and at bedtime, up to QID 200 mL 3   lidocaine-prilocaine (EMLA) cream Apply to affected area once 30 g 3   LORazepam (ATIVAN) 0.5 MG tablet Take 1 tablet (0.5 mg total) by mouth every 8 (eight) hours as needed. 30 tablet 0   morphine (MS CONTIN) 15 MG 12 hr tablet Take 1 tablet (15 mg total) by mouth every 12 (twelve)  hours. 30 tablet 0   Multiple Vitamin (MULTI-VITAMIN) tablet Take 1 tablet by mouth daily.     Multiple Vitamins-Minerals (MULTIVITAMIN WITH MINERALS) tablet Take 1 tablet by mouth daily.     Nutritional Supplements (FEEDING SUPPLEMENT, OSMOLITE 1.5 CAL,) LIQD Give 7 cartons Osmolite 1.5 via PEG split into 4 feedings with 60 mL free water before and after.  Give 45 mL Prosource TF or equivalent 3 times daily with 30 mL free water before and after.  Give additional 720 mL free water via PEG daily.  Provides 2620 cal, 137.3 g protein, 2647 mL of free water/100% estimated nutrition needs. 1659 mL 6   ondansetron (ZOFRAN) 8 MG tablet Take 1 tablet (8 mg total) by mouth 2 (two) times daily as needed. Start on the third day after cisplatin chemotherapy. 30 tablet 1   oxyCODONE-acetaminophen (PERCOCET) 7.5-325 MG tablet Take 1 tablet by mouth every 6 (six) hours as needed for severe pain. 60 tablet 0   prochlorperazine (COMPAZINE) 10 MG tablet Take 1 tablet (10 mg  total) by mouth every 6 (six) hours as needed (Nausea or vomiting). 30 tablet 1   No current facility-administered medications for this visit.    PHYSICAL EXAMINATION: ECOG PERFORMANCE STATUS: 1 - Symptomatic but completely ambulatory  Vitals:   01/25/21 0807  BP: 106/69  Pulse: 74  Resp: 18  Temp: 97.9 F (36.6 C)  SpO2: 98%   Filed Weights   01/25/21 0807  Weight: 228 lb 14.4 oz (103.8 kg)   Physical Exam Constitutional:      Appearance: Normal appearance.  HENT:     Head: Normocephalic and atraumatic.     Mouth/Throat:     Pharynx: Oropharyngeal exudate and posterior oropharyngeal erythema present.  Eyes:     Pupils: Pupils are equal, round, and reactive to light.  Cardiovascular:     Rate and Rhythm: Normal rate and regular rhythm.     Pulses: Normal pulses.     Heart sounds: Normal heart sounds.  Pulmonary:     Effort: Pulmonary effort is normal.     Breath sounds: Normal breath sounds.  Abdominal:     General:  Abdomen is flat.     Palpations: Abdomen is soft.  Musculoskeletal:        General: Normal range of motion.     Cervical back: Normal range of motion and neck supple. No rigidity.  Lymphadenopathy:     Cervical: No cervical adenopathy.  Skin:    General: Skin is warm and dry.  Neurological:     General: No focal deficit present.     Mental Status: He is alert.  Psychiatric:        Mood and Affect: Mood normal.     LABORATORY DATA:  I have reviewed the data as listed CBC Latest Ref Rng & Units 01/25/2021 01/17/2021 01/10/2021  WBC 4.0 - 10.5 K/uL 3.9(L) 5.9 8.4  Hemoglobin 13.0 - 17.0 g/dL 9.4(L) 11.5(L) 11.9(L)  Hematocrit 39.0 - 52.0 % 26.5(L) 32.5(L) 34.3(L)  Platelets 150 - 400 K/uL 102(L) 118(L) 151     CMP Latest Ref Rng & Units 01/25/2021 01/17/2021 01/10/2021  Glucose 70 - 99 mg/dL 188(H) 94 95  BUN 6 - 20 mg/dL 23(H) 15 19  Creatinine 0.61 - 1.24 mg/dL 1.04 1.09 1.20  Sodium 135 - 145 mmol/L 137 137 135  Potassium 3.5 - 5.1 mmol/L 4.6 4.4 3.5  Chloride 98 - 111 mmol/L 102 102 102  CO2 22 - 32 mmol/L 26 26 28   Calcium 8.9 - 10.3 mg/dL 9.3 9.1 8.4(L)      RADIOGRAPHIC STUDIES: I have personally reviewed the radiological images as listed and agreed with the findings in the report. No results found.   Benay Pike MD

## 2021-01-26 ENCOUNTER — Inpatient Hospital Stay: Payer: Managed Care, Other (non HMO)

## 2021-01-26 ENCOUNTER — Ambulatory Visit
Admission: RE | Admit: 2021-01-26 | Discharge: 2021-01-26 | Disposition: A | Discharge status: Home / Self Care | Payer: Managed Care, Other (non HMO) | Source: Ambulatory Visit | Attending: Radiation Oncology | Admitting: Radiation Oncology

## 2021-01-26 ENCOUNTER — Inpatient Hospital Stay: Payer: Managed Care, Other (non HMO) | Admitting: Nutrition

## 2021-01-26 VITALS — BP 94/66 | HR 69 | Temp 98.9°F | Resp 18

## 2021-01-26 DIAGNOSIS — C109 Malignant neoplasm of oropharynx, unspecified: Secondary | ICD-10-CM

## 2021-01-26 DIAGNOSIS — Z510 Encounter for antineoplastic radiation therapy: Secondary | ICD-10-CM | POA: Diagnosis not present

## 2021-01-26 MED ORDER — SODIUM CHLORIDE 0.9 % IV SOLN
150.0000 mg | Freq: Once | INTRAVENOUS | Status: AC
  Administered 2021-01-26: 150 mg via INTRAVENOUS
  Filled 2021-01-26: qty 150

## 2021-01-26 MED ORDER — SODIUM CHLORIDE 0.9 % IV SOLN
INTRAVENOUS | Status: DC
  Filled 2021-01-26 (×2): qty 250

## 2021-01-26 MED ORDER — MAGNESIUM SULFATE 2 GM/50ML IV SOLN
INTRAVENOUS | Status: AC
  Filled 2021-01-26: qty 50

## 2021-01-26 MED ORDER — MAGNESIUM SULFATE 2 GM/50ML IV SOLN
2.0000 g | Freq: Once | INTRAVENOUS | Status: AC
  Administered 2021-01-26: 2 g via INTRAVENOUS

## 2021-01-26 MED ORDER — PALONOSETRON HCL INJECTION 0.25 MG/5ML
0.2500 mg | Freq: Once | INTRAVENOUS | Status: AC
  Administered 2021-01-26: 0.25 mg via INTRAVENOUS

## 2021-01-26 MED ORDER — POTASSIUM CHLORIDE IN NACL 20-0.9 MEQ/L-% IV SOLN
Freq: Once | INTRAVENOUS | Status: AC
Start: 2021-01-26 — End: 2021-01-26
  Filled 2021-01-26: qty 1000

## 2021-01-26 MED ORDER — SODIUM CHLORIDE 0.9 % IV SOLN
40.0000 mg/m2 | Freq: Once | INTRAVENOUS | Status: AC
  Administered 2021-01-26: 94 mg via INTRAVENOUS
  Filled 2021-01-26: qty 94

## 2021-01-26 MED ORDER — SODIUM CHLORIDE 0.9 % IV SOLN
10.0000 mg | Freq: Once | INTRAVENOUS | Status: AC
  Administered 2021-01-26: 10 mg via INTRAVENOUS
  Filled 2021-01-26: qty 10

## 2021-01-26 NOTE — Progress Notes (Signed)
Nutrition follow-up completed during infusion for tonsil cancer.  He continues concurrent chemoradiation therapy for tonsil cancer and is followed by Dr. Chryl Heck and Dr. Isidore Moos. Weight decreased 4.3 pounds to 228 pounds on July 5 decreased from 232.3 pounds June 27. Patient reports he is not eating or drinking by mouth because it is too painful. He states he does have some nausea but denied vomiting. Reviewed labs and noted glucose 188, BUN 23, creatinine 1.04. Reports steroids have helped with his nausea and thickened mucus. Reports no bowel movement for 3-4 days.  Reports taking stool softener but it has not improved stool regularity. Reports he is tolerating 5-7 cartons of Osmolite 1.5 via PEG daily split into 4-6 feedings.  He has not been using Prosource.  Reports he is using 2 -16 ounce bottles of water for free water flushes.  Estimated nutrition needs: 2600-2800 cal, 130-145 g protein, 2.8 L fluid.  Nutrition diagnosis: Inadequate oral intake continues.  Intervention: Encouraged patient to work to increase Osmolite 1.5-1-1/2 cartons with 60 mL free water before and after bolus feedings 4 times a day.  When tolerated increased to 7 cartons daily split into 4 feedings. Add 45 mL Prosource 3 times daily with a 30 mL water flush before and after. In addition give an additional 240 mL free water via PEG 3 times a day. Provides 2620 cal, 137.3 g protein, 2647 mL free water/100% estimated needs. Recommend try Reglan to help with nausea and early satiety.  Monitoring, evaluation, goals: Patient will work to increase calories and protein to meet greater than 90% estimated nutrition needs.  Next visit: Tuesday, July 12 during infusion.  **Disclaimer: This note was dictated with voice recognition software. Similar sounding words can inadvertently be transcribed and this note may contain transcription errors which may not have been corrected upon publication of note.**

## 2021-01-27 ENCOUNTER — Other Ambulatory Visit: Payer: Self-pay

## 2021-01-27 ENCOUNTER — Ambulatory Visit
Admission: RE | Admit: 2021-01-27 | Discharge: 2021-01-27 | Disposition: A | Discharge status: Home / Self Care | Payer: Managed Care, Other (non HMO) | Source: Ambulatory Visit | Attending: Radiation Oncology | Admitting: Radiation Oncology

## 2021-01-27 DIAGNOSIS — R112 Nausea with vomiting, unspecified: Secondary | ICD-10-CM | POA: Diagnosis not present

## 2021-01-28 ENCOUNTER — Ambulatory Visit
Admission: RE | Admit: 2021-01-28 | Discharge: 2021-01-28 | Disposition: A | Discharge status: Home / Self Care | Payer: Managed Care, Other (non HMO) | Source: Ambulatory Visit | Attending: Radiation Oncology | Admitting: Radiation Oncology

## 2021-01-29 ENCOUNTER — Other Ambulatory Visit: Payer: Self-pay

## 2021-01-29 ENCOUNTER — Encounter (HOSPITAL_COMMUNITY): Payer: Self-pay | Admitting: Emergency Medicine

## 2021-01-29 ENCOUNTER — Emergency Department (HOSPITAL_COMMUNITY): Payer: Managed Care, Other (non HMO)

## 2021-01-29 ENCOUNTER — Other Ambulatory Visit: Payer: Self-pay | Admitting: Hematology and Oncology

## 2021-01-29 ENCOUNTER — Inpatient Hospital Stay: Payer: Managed Care, Other (non HMO)

## 2021-01-29 ENCOUNTER — Inpatient Hospital Stay (HOSPITAL_COMMUNITY)
Admission: EM | Admit: 2021-01-29 | Discharge: 2021-02-08 | DRG: 391 | Disposition: A | Discharge status: Home / Self Care | Payer: Managed Care, Other (non HMO) | Attending: Internal Medicine | Admitting: Internal Medicine

## 2021-01-29 VITALS — BP 97/72 | HR 66 | Temp 98.7°F | Resp 18

## 2021-01-29 DIAGNOSIS — E669 Obesity, unspecified: Secondary | ICD-10-CM | POA: Diagnosis present

## 2021-01-29 DIAGNOSIS — E860 Dehydration: Secondary | ICD-10-CM

## 2021-01-29 DIAGNOSIS — R112 Nausea with vomiting, unspecified: Principal | ICD-10-CM | POA: Diagnosis present

## 2021-01-29 DIAGNOSIS — Z79891 Long term (current) use of opiate analgesic: Secondary | ICD-10-CM

## 2021-01-29 DIAGNOSIS — D61818 Other pancytopenia: Secondary | ICD-10-CM | POA: Diagnosis not present

## 2021-01-29 DIAGNOSIS — G893 Neoplasm related pain (acute) (chronic): Secondary | ICD-10-CM | POA: Diagnosis present

## 2021-01-29 DIAGNOSIS — C099 Malignant neoplasm of tonsil, unspecified: Secondary | ICD-10-CM | POA: Diagnosis present

## 2021-01-29 DIAGNOSIS — X58XXXA Exposure to other specified factors, initial encounter: Secondary | ICD-10-CM | POA: Diagnosis present

## 2021-01-29 DIAGNOSIS — G4730 Sleep apnea, unspecified: Secondary | ICD-10-CM | POA: Diagnosis present

## 2021-01-29 DIAGNOSIS — T451X5A Adverse effect of antineoplastic and immunosuppressive drugs, initial encounter: Secondary | ICD-10-CM | POA: Diagnosis present

## 2021-01-29 DIAGNOSIS — D61810 Antineoplastic chemotherapy induced pancytopenia: Secondary | ICD-10-CM | POA: Diagnosis present

## 2021-01-29 DIAGNOSIS — D701 Agranulocytosis secondary to cancer chemotherapy: Secondary | ICD-10-CM | POA: Diagnosis present

## 2021-01-29 DIAGNOSIS — K219 Gastro-esophageal reflux disease without esophagitis: Secondary | ICD-10-CM | POA: Diagnosis not present

## 2021-01-29 DIAGNOSIS — Z931 Gastrostomy status: Secondary | ICD-10-CM

## 2021-01-29 DIAGNOSIS — R1310 Dysphagia, unspecified: Secondary | ICD-10-CM | POA: Diagnosis present

## 2021-01-29 DIAGNOSIS — D630 Anemia in neoplastic disease: Secondary | ICD-10-CM | POA: Diagnosis present

## 2021-01-29 DIAGNOSIS — C770 Secondary and unspecified malignant neoplasm of lymph nodes of head, face and neck: Secondary | ICD-10-CM | POA: Diagnosis present

## 2021-01-29 DIAGNOSIS — Z20822 Contact with and (suspected) exposure to covid-19: Secondary | ICD-10-CM | POA: Diagnosis present

## 2021-01-29 DIAGNOSIS — Z79899 Other long term (current) drug therapy: Secondary | ICD-10-CM

## 2021-01-29 DIAGNOSIS — C109 Malignant neoplasm of oropharynx, unspecified: Secondary | ICD-10-CM | POA: Diagnosis present

## 2021-01-29 DIAGNOSIS — Z6833 Body mass index (BMI) 33.0-33.9, adult: Secondary | ICD-10-CM

## 2021-01-29 HISTORY — DX: Dehydration: E86.0

## 2021-01-29 HISTORY — DX: Malignant (primary) neoplasm, unspecified: C80.1

## 2021-01-29 LAB — URINALYSIS, ROUTINE W REFLEX MICROSCOPIC
Bilirubin Urine: NEGATIVE
Glucose, UA: NEGATIVE mg/dL
Hgb urine dipstick: NEGATIVE
Ketones, ur: NEGATIVE mg/dL
Leukocytes,Ua: NEGATIVE
Nitrite: NEGATIVE
Protein, ur: NEGATIVE mg/dL
Specific Gravity, Urine: 1.018 (ref 1.005–1.030)
pH: 6 (ref 5.0–8.0)

## 2021-01-29 LAB — RESP PANEL BY RT-PCR (FLU A&B, COVID) ARPGX2
Influenza A by PCR: NEGATIVE
Influenza B by PCR: NEGATIVE
SARS Coronavirus 2 by RT PCR: NEGATIVE

## 2021-01-29 LAB — CBC WITH DIFFERENTIAL/PLATELET
Abs Immature Granulocytes: 0.01 10*3/uL (ref 0.00–0.07)
Basophils Absolute: 0 10*3/uL (ref 0.0–0.1)
Basophils Relative: 0 %
Eosinophils Absolute: 0 10*3/uL (ref 0.0–0.5)
Eosinophils Relative: 0 %
HCT: 23.5 % — ABNORMAL LOW (ref 39.0–52.0)
Hemoglobin: 8.2 g/dL — ABNORMAL LOW (ref 13.0–17.0)
Immature Granulocytes: 0 %
Lymphocytes Relative: 7 %
Lymphs Abs: 0.2 10*3/uL — ABNORMAL LOW (ref 0.7–4.0)
MCH: 33.2 pg (ref 26.0–34.0)
MCHC: 34.9 g/dL (ref 30.0–36.0)
MCV: 95.1 fL (ref 80.0–100.0)
Monocytes Absolute: 0.2 10*3/uL (ref 0.1–1.0)
Monocytes Relative: 7 %
Neutro Abs: 2.6 10*3/uL (ref 1.7–7.7)
Neutrophils Relative %: 86 %
Platelets: 120 10*3/uL — ABNORMAL LOW (ref 150–400)
RBC: 2.47 MIL/uL — ABNORMAL LOW (ref 4.22–5.81)
RDW: 12.9 % (ref 11.5–15.5)
WBC: 3.1 10*3/uL — ABNORMAL LOW (ref 4.0–10.5)
nRBC: 0 % (ref 0.0–0.2)

## 2021-01-29 LAB — COMPREHENSIVE METABOLIC PANEL
ALT: 39 U/L (ref 0–44)
AST: 25 U/L (ref 15–41)
Albumin: 3.5 g/dL (ref 3.5–5.0)
Alkaline Phosphatase: 53 U/L (ref 38–126)
Anion gap: 8 (ref 5–15)
BUN: 27 mg/dL — ABNORMAL HIGH (ref 6–20)
CO2: 26 mmol/L (ref 22–32)
Calcium: 8.7 mg/dL — ABNORMAL LOW (ref 8.9–10.3)
Chloride: 102 mmol/L (ref 98–111)
Creatinine, Ser: 1.07 mg/dL (ref 0.61–1.24)
GFR, Estimated: >60 mL/min (ref >60)
Glucose, Bld: 123 mg/dL — ABNORMAL HIGH (ref 70–99)
Potassium: 3.9 mmol/L (ref 3.5–5.1)
Sodium: 136 mmol/L (ref 135–145)
Total Bilirubin: 1.5 mg/dL — ABNORMAL HIGH (ref 0.3–1.2)
Total Protein: 5.8 g/dL — ABNORMAL LOW (ref 6.5–8.1)

## 2021-01-29 LAB — HEMOGLOBIN AND HEMATOCRIT, BLOOD
HCT: 20.4 % — ABNORMAL LOW (ref 39.0–52.0)
Hemoglobin: 7.2 g/dL — ABNORMAL LOW (ref 13.0–17.0)

## 2021-01-29 LAB — PROTIME-INR
INR: 1.1 (ref 0.8–1.2)
Prothrombin Time: 13.7 seconds (ref 11.4–15.2)

## 2021-01-29 LAB — TYPE AND SCREEN
ABO/RH(D): O POS
Antibody Screen: NEGATIVE

## 2021-01-29 LAB — MAGNESIUM: Magnesium: 1.9 mg/dL (ref 1.7–2.4)

## 2021-01-29 LAB — LIPASE, BLOOD: Lipase: 25 U/L (ref 11–51)

## 2021-01-29 LAB — ABO/RH: ABO/RH(D): O POS

## 2021-01-29 LAB — LACTIC ACID, PLASMA: Lactic Acid, Venous: 1.7 mmol/L (ref 0.5–1.9)

## 2021-01-29 MED ORDER — PANTOPRAZOLE SODIUM 40 MG IV SOLR
40.0000 mg | INTRAVENOUS | Status: DC
  Administered 2021-01-30 – 2021-02-08 (×10): 40 mg via INTRAVENOUS
  Filled 2021-01-29 (×10): qty 40

## 2021-01-29 MED ORDER — SODIUM CHLORIDE 0.9% FLUSH: 10.0000 mL | INTRAVENOUS | Status: DC | PRN

## 2021-01-29 MED ORDER — SODIUM CHLORIDE 0.9 % IV SOLN: Freq: Once | INTRAVENOUS | Status: AC

## 2021-01-29 MED ORDER — HEPARIN SOD (PORK) LOCK FLUSH 100 UNIT/ML IV SOLN: 500.0000 [IU] | Freq: Once | INTRAVENOUS | Status: DC | PRN

## 2021-01-29 MED ORDER — ACETAMINOPHEN 325 MG PO TABS: 650.0000 mg | ORAL_TABLET | Freq: Four times a day (QID) | ORAL | Status: DC | PRN

## 2021-01-29 MED ORDER — MORPHINE SULFATE (PF) 4 MG/ML IV SOLN
4.0000 mg | INTRAVENOUS | Status: DC | PRN
Start: 2021-01-29 — End: 2021-01-31
  Administered 2021-01-30 – 2021-01-31 (×4): 4 mg via INTRAVENOUS
  Filled 2021-01-29 (×4): qty 1

## 2021-01-29 MED ORDER — SODIUM CHLORIDE 0.9 % IV BOLUS
1000.0000 mL | Freq: Once | INTRAVENOUS | Status: AC
  Administered 2021-01-29: 1000 mL via INTRAVENOUS

## 2021-01-29 MED ORDER — SODIUM CHLORIDE 0.9% FLUSH
10.0000 mL | Freq: Two times a day (BID) | INTRAVENOUS | Status: DC
  Administered 2021-01-30 – 2021-02-08 (×9): 10 mL

## 2021-01-29 MED ORDER — ONDANSETRON HCL 4 MG/2ML IJ SOLN
4.0000 mg | Freq: Once | INTRAMUSCULAR | Status: AC
  Administered 2021-01-29: 4 mg via INTRAVENOUS
  Filled 2021-01-29: qty 2

## 2021-01-29 MED ORDER — NALOXONE HCL 0.4 MG/ML IJ SOLN: 0.4000 mg | INTRAMUSCULAR | Status: DC | PRN

## 2021-01-29 MED ORDER — MORPHINE SULFATE (PF) 4 MG/ML IV SOLN
4.0000 mg | Freq: Once | INTRAVENOUS | Status: AC
Start: 2021-01-29 — End: 2021-01-30
  Administered 2021-01-30: 4 mg via INTRAVENOUS
  Filled 2021-01-29: qty 1

## 2021-01-29 MED ORDER — ACETAMINOPHEN 650 MG RE SUPP: 650.0000 mg | Freq: Four times a day (QID) | RECTAL | Status: DC | PRN

## 2021-01-29 MED ORDER — SODIUM CHLORIDE 0.9% FLUSH: 10.0000 mL | Freq: Once | INTRAVENOUS | Status: DC | PRN

## 2021-01-29 MED ORDER — ONDANSETRON HCL 40 MG/20ML IJ SOLN: Freq: Once | INTRAMUSCULAR | Status: AC

## 2021-01-29 MED ORDER — CHLORHEXIDINE GLUCONATE CLOTH 2 % EX PADS
6.0000 | MEDICATED_PAD | Freq: Every day | CUTANEOUS | Status: DC
  Administered 2021-01-30 – 2021-02-08 (×10): 6 via TOPICAL

## 2021-01-29 MED ORDER — PANTOPRAZOLE 80MG IVPB - SIMPLE MED
80.0000 mg | Freq: Once | INTRAVENOUS | Status: AC
  Administered 2021-01-29: 80 mg via INTRAVENOUS
  Filled 2021-01-29: qty 80

## 2021-01-29 MED ORDER — ONDANSETRON HCL 4 MG/2ML IJ SOLN
4.0000 mg | Freq: Four times a day (QID) | INTRAMUSCULAR | Status: DC | PRN
  Administered 2021-01-30 – 2021-02-08 (×23): 4 mg via INTRAVENOUS
  Filled 2021-01-29 (×21): qty 2

## 2021-01-29 MED ORDER — LACTATED RINGERS IV SOLN: INTRAVENOUS | Status: DC

## 2021-01-29 MED ORDER — LORAZEPAM 2 MG/ML IJ SOLN
0.5000 mg | Freq: Four times a day (QID) | INTRAMUSCULAR | Status: DC | PRN
  Administered 2021-01-29 – 2021-02-08 (×19): 0.5 mg via INTRAVENOUS
  Filled 2021-01-29 (×19): qty 1

## 2021-01-29 NOTE — ED Triage Notes (Signed)
Pt reports vomiting bright red blood x 24-36 hours.  Pt has throat cancer and last chemo on Wednesday.  Reports generalized abd pain.

## 2021-01-29 NOTE — Addendum Note (Signed)
Addended by: Evalee [name] on: 01/29/2021 09:41 AM   Modules accepted: Orders

## 2021-01-29 NOTE — Patient Instructions (Signed)

## 2021-01-29 NOTE — Progress Notes (Signed)
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  We will provide IV fluids, close monitoring of ensuing electrolytes, including adding on serum magnesium.  As needed IV antiemetics, including as needed IV Ativan.      Babs Bertin, DO Hospitalist

## 2021-01-29 NOTE — ED Provider Notes (Signed)
[facility] EMERGENCY DEPARTMENT Provider Note   CSN: [id number] Arrival date & time: 01/29/21  1552     History No chief complaint on file.   Gerald [name] is a 50 y.o. male.  50 year old male with prior medical history as detailed below presents for evaluation.  Patient complains of persistent nausea and vomiting.  Patient reports multiple episodes of emesis over the course of today.  He reports seeing some flecks of bright red blood in the last several rounds of emesis.  He denies fever.  He reports that he has been using antiemetics at home home without improvement.  Patient denies bloody stools.  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  The history is provided by the patient and medical records.  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   Head and neck cancer ([place]) 12/14/2020   Cancer of tonsillar fossa ([place]) 12/07/2020   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   Neoplasm related pain 12/06/2020   Impotence due to erectile dysfunction 08/30/2012   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  Social History   Tobacco Use   Smoking status: Never   Smokeless tobacco: Never  Vaping Use   Vaping Use: Never used  Substance Use Topics   Alcohol use: Not Currently    Comment: rare   Drug use: Never    Home Medications Prior to Admission medications   Medication Sig Start Date End Date Taking? Authorizing Provider  dexamethasone (DECADRON) 4 MG tablet Take 2 tablets (8 mg total) by mouth daily. Take daily x 3 days starting the day after cisplatin chemotherapy. Take with food. 12/22/20   Benay Pike, MD  lidocaine (XYLOCAINE) 2 % solution Patient: Mix 1part 2% viscous lidocaine, 1part H20. Swish & swallow 19mL of diluted mixture, 53min before meals and at bedtime, up to QID 01/10/21   Eppie Gibson, MD  lidocaine-prilocaine (EMLA) cream Apply to affected area once 12/22/20   Benay Pike, MD  LORazepam (ATIVAN) 0.5 MG tablet Take 1 tablet (0.5 mg total) by mouth every 8 (eight) hours as needed. 01/17/21   Alla Feeling, NP  magic mouthwash w/lidocaine SOLN Swish and spit 5 mLs every 6 (six) hours as needed for Pain    [provider]  morphine (MS CONTIN) 15 MG 12 hr tablet Take 1 tablet (15 mg total) by mouth every 12 (twelve) hours. 01/14/21   Benay Pike, MD  Multiple Vitamin (MULTI-VITAMIN) tablet Take 1 tablet by mouth daily. 12/09/20   [provider]  Multiple Vitamins-Minerals (MULTIVITAMIN WITH MINERALS) tablet Take 1 tablet by mouth daily.    [provider]  Nutritional Supplements (FEEDING SUPPLEMENT, OSMOLITE 1.5 CAL,) LIQD Give 7 cartons Osmolite 1.5 via PEG split into 4 feedings with 60 mL free water before and after.  Give 45 mL Prosource TF or equivalent 3 times  daily with 30 mL free water before and after.  Give additional 720 mL free water via PEG daily.  Provides 2620 cal, 137.3 g protein, 2647 mL of free water/100% estimated nutrition needs. 01/05/21   Eppie Gibson, MD  ondansetron (ZOFRAN) 8 MG tablet Take 1 tablet (8 mg total) by mouth 2 (two) times daily as needed. Start on the third day after cisplatin chemotherapy. 12/22/20   Benay Pike, MD  oxyCODONE-acetaminophen (PERCOCET) 7.5-325 MG tablet Take 1 tablet by mouth every 6 (six) hours as needed for severe pain. 01/03/21   Benay Pike, MD  prochlorperazine (COMPAZINE) 10 MG tablet Take 1 tablet (10 mg total) by mouth every 6 (six) hours as needed (Nausea or vomiting). 12/22/20   Benay Pike, MD  fluticasone (FLONASE) 50 MCG/ACT nasal spray Place 2 sprays into both nostrils daily. 06/06/13 11/11/20  Roselee Culver, MD  sildenafil (VIAGRA) 100 MG tablet Take 1 tablet (100 mg total) by mouth daily as needed. 08/30/12 11/11/20  Mancel Bale, PA-C    Allergies    Patient has no known allergies.  Review of Systems   Review of Systems  All other systems reviewed and are negative.  Physical Exam Updated Vital Signs BP 109/80   Pulse 69   Temp 98.1 F (36.7 C)   Resp 16   SpO2 98%   Physical Exam Vitals and nursing note reviewed.  Constitutional:      General: He is not in acute distress.    Appearance: Normal appearance. He is well-developed.  HENT:     Head: Normocephalic and atraumatic.     Mouth/Throat:     Mouth: Mucous membranes are dry.  Eyes:     Conjunctiva/sclera: Conjunctivae normal.     Pupils: Pupils are equal, round, and reactive to light.  Cardiovascular:     Rate and Rhythm: Normal rate and regular rhythm.     Heart sounds: Normal heart sounds.  Pulmonary:     Effort: Pulmonary effort is normal. No respiratory distress.     Breath sounds: Normal breath sounds.  Abdominal:     General: There is no distension.     Palpations: Abdomen is soft.      Tenderness: There is no abdominal tenderness.     Comments: G tube in place at epigastrium  Musculoskeletal:        General: No deformity. Normal range of motion.     Cervical back: Normal range of motion and neck supple.  Skin:    General: Skin is warm and dry.  Neurological:     General: No focal deficit present.     Mental Status: He is alert and oriented to person, place, and time.    ED Results / Procedures / Treatments   Labs (all labs ordered are listed, but only abnormal results are displayed) Labs Reviewed  COMPREHENSIVE METABOLIC PANEL - Abnormal; Notable for the following components:  Result Value   Glucose, Bld 123 (*)    BUN 27 (*)    Calcium 8.7 (*)    Total Protein 5.8 (*)    Total Bilirubin 1.5 (*)    All other components within normal limits  CBC WITH DIFFERENTIAL/PLATELET - Abnormal; Notable for the following components:   WBC 3.1 (*)    RBC 2.47 (*)    Hemoglobin 8.2 (*)    HCT 23.5 (*)    Platelets 120 (*)    Lymphs Abs 0.2 (*)    All other components within normal limits  RESP PANEL BY RT-PCR (FLU A&B, COVID) ARPGX2  LIPASE, BLOOD  PROTIME-INR  LACTIC ACID, PLASMA  URINALYSIS, ROUTINE W REFLEX MICROSCOPIC  LACTIC ACID, PLASMA  HIV ANTIBODY (ROUTINE TESTING W REFLEX)  MAGNESIUM  MAGNESIUM  COMPREHENSIVE METABOLIC PANEL  CBC WITH DIFFERENTIAL/PLATELET  TYPE AND SCREEN  ABO/RH    EKG None  Radiology DG Chest Port 1 View  Result Date: 01/29/2021 CLINICAL DATA:  Dyspnea EXAM: PORTABLE CHEST 1 VIEW COMPARISON:  November 11, 2020 FINDINGS: Right chest wall Port-A-Cath with tip overlying the superior cavoatrial junction. The heart size and mediastinal contours are within normal limits. Both lungs are clear. The visualized skeletal structures are unremarkable. IMPRESSION: No acute abnormality of the lungs in AP portable projection. Electronically Signed   By: Dahlia Bailiff MD   On: 01/29/2021 17:09    Procedures Procedures   Medications Ordered  in ED Medications  pantoprazole (PROTONIX) 80 mg /NS 100 mL IVPB (has no administration in time range)  acetaminophen (TYLENOL) tablet 650 mg (has no administration in time range)    Or  acetaminophen (TYLENOL) suppository 650 mg (has no administration in time range)  LORazepam (ATIVAN) injection 0.5 mg (has no administration in time range)  ondansetron (ZOFRAN) injection 4 mg (has no administration in time range)  sodium chloride 0.9 % bolus 1,000 mL (1,000 mLs Intravenous New Bag/Given 01/29/21 1652)  ondansetron (ZOFRAN) injection 4 mg (4 mg Intravenous Given 01/29/21 1652)    ED Course  I have reviewed the triage vital signs and the nursing notes.  Pertinent labs & imaging results that were available during my care of the patient were reviewed by me and considered in my medical decision making (see chart for details).    MDM Rules/Calculators/A&P                          MDM  MSE complete  Qaadir Kent was evaluated in Emergency Department on 01/29/2021 for the symptoms described in the history of present illness. He was evaluated in the context of the global COVID-19 pandemic, which necessitated consideration that the patient might be at risk for infection with the SARS-CoV-2 virus that causes COVID-19. Institutional protocols and algorithms that pertain to the evaluation of patients at risk for COVID-19 are in a state of rapid change based on information released by regulatory bodies including the CDC and federal and state organizations. These policies and algorithms were followed during the patient's care in the ED.  Patient is presenting with complaint of persistent nausea and vomiting.  Patient with reassuring labs on initial work-up.  However, patient with persistent complaint of nausea.  Will discuss with hospitalist for admission overnight observation with plan for continued antiemetics and IV fluids.   Final Clinical Impression(s) / ED Diagnoses Final diagnoses:   Intractable vomiting with nausea, unspecified vomiting type    Rx / DC Orders ED Discharge Orders  None        Valarie Merino, MD 01/29/21 [phone]

## 2021-01-29 NOTE — H&P (Signed)
History and Physical    PLEASE NOTE THAT DRAGON DICTATION SOFTWARE WAS USED IN THE CONSTRUCTION OF THIS NOTE.   Gerald [name] XBM:[id number] DOB: 30-Jun-1971 DOA: 01/29/2021  PCP: Pcp, No Patient coming from: home   I have personally briefly reviewed patient's old medical records in [place]  Chief Complaint: Nausea vomiting  HPI: Gerald [name] is a 50 y.o. 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   The following history is obtained via my discussions with the patient as well as my discussions with the emergency department physician and via chart review.  In the context of tonsillar squamous cell carcinoma with documentation of metastasis to cervical lymph nodes, for which the patient most recently underwent chemotherapy on Wednesday, 01/26/2021, the patient presents with complaint of nausea/vomiting starting on 01/28/2021.  Over the time, he notes persistent nausea resulting in an estimated 8-9 episodes of emesis over that time, with most recent such episode occurring in the emergency department today.  He notes that this nausea/vomiting has been refractory to his home antiemetics, including Zofran and Compazine.  Additionally, in the setting, he presented to the outpatient infusion center earlier today at which time he received IV fluids and IV antiemetics.  However, in spite of these measures, he has continued to experience nausea/vomiting, prompting him to present to [facility] emergency department for further evaluation and management.  He describes the emesis as nonbilious in nature, and aside "a few flecks of blood" during 1 episode of emesis earlier today, he reports that his emesis has been nonbloody.  These for flecks of blood  occurred around episode 4 or 5 of his emesis, with patient reporting that he has vomited multiple times subsequent to this, with interval resolution of the "flecks of blood".  No gross blood observed in emesis occurring in the ED today.  Vomiting has worsened anytime he is attempted to drink any water or take any oral medications over the course of the last day, as well as worsened when attempting to use his G-tube.  Consequently, the patient verbalizes concern that he is currently unable to tolerate any p.o./G-tube intake.  Denies any significant abdominal discomfort.  Denies any recent diarrhea, melena, or hematochezia.  No recent trauma or travel.  Denies any dysuria, gross hematuria, or change in urinary urgency/frequency.  He also denies any recent chest pain, shortness of breath, diaphoresis, palpitations, dizziness, presyncope, or syncope.  No recent known COVID-19 exposures.  Denies any recent subjective fever, chills, rigors, or generalized myalgias.  No recent headache, neck stiffness, rhinitis, rhinorrhea, wheezing, cough, or rash.  In the setting of his tonsillar squamous cell carcinoma, he follows with Dr. Chryl Heck as his outpatient oncologist, he has a right-sided Port-A-Cath in place.  Additionally, during the first week of June 2022, he had IR placement of gastrostomy tube.  Subsequently, he notes that he is able to take his pills by mouth and is also able to consume water PO.  But otherwise notes all food is administered via his G-tube.    ED Course:  Vital signs in the ED were notable for the following: Temperature max 98.1, heart rates 56-72; blood pressure 100/64 -121/77; respiratory rate 16-19; oxygen saturation 97 to 100% on room air.  Labs were notable for the following: CMP notable for the following:  Sodium 136, potassium 3.9, bicarbonate 26, BUN 27 compared to most recent prior value of 23 on 01/25/2021, creatinine 1.07 relative to most recent prior value of 1.04 on 01/25/2021, and  liver enzymes were found with normal limits.  CBC notable for white blood cell count of 3100 compared with 3900 on 01/25/2021, with presenting white blood cell count associated with an absolute neutrophil count of 2600, hemoglobin 8.2 compared to 9.4 on 01/25/2021, with presenting hemoglobin associated normocytic/normochromic findings as well as nonelevated RDW, platelet count 120 compared to 102 on 01/25/2021.  INR 1.1.  Urinalysis was notable for no white blood cells, leukocyte Estrace negative, nitrate negative, and specific remedy 1.018.  Screening nasopharyngeal COVID-19/Fonzo PCR were checked in the ED today, with result currently pending.  EKG shows sinus rhythm with heart rate 80, normal intervals, no evidence of T wave changes, less than 1 mm ST elevation limited to lead 2, and no evidence of ST depression.  Chest x-ray showed no evidence of acute cardiopulmonary process.    While in the ED, the following were administered: Normal saline x1 L bolus, Zofran 4 mg IV x1.  Subsequently, the patient was admitted to the med telemetry floor for overnight observation for further evaluation and management of presenting intractable nausea/vomiting, including current inability to tolerate p.o intake.     Review of Systems: As per HPI otherwise 10 point review of systems negative.   Past Medical History:  Diagnosis Date   Cancer [facility])    Sleep apnea    Tonsillar mass 11/16/2020    Past Surgical History:  Procedure Laterality Date   DIRECT LARYNGOSCOPY N/A 11/22/2020   Procedure: DIRECT LARYNGOSCOPY WITH BIOPSY;  Surgeon: Leta Baptist, MD;  Location: [place];  Service: ENT;  Laterality: N/A;   FOOT SURGERY  2000   extra nivicular removal   IR GASTROSTOMY TUBE MOD SED  12/24/2020   IR IMAGING GUIDED PORT INSERTION  12/24/2020   SPINE SURGERY     TONSILLECTOMY Left 11/22/2020   Procedure: LEFT TONSILLECTOMY;  Surgeon: Leta Baptist, MD;  Location: [place];  Service: ENT;   Laterality: Left;    Social History:  reports that he has never smoked. He has never used smokeless tobacco. He reports previous alcohol use. He reports that he does not use drugs.   No Known Allergies  Family history reviewed and not pertinent    Prior to Admission medications   Medication Sig Start Date End Date Taking? Authorizing Provider  dexamethasone (DECADRON) 4 MG tablet Take 2 tablets (8 mg total) by mouth daily. Take daily x 3 days starting the day after cisplatin chemotherapy. Take with food. 12/22/20   Benay Pike, MD  lidocaine (XYLOCAINE) 2 % solution Patient: Mix 1part 2% viscous lidocaine, 1part H20. Swish & swallow 55mL of diluted mixture, 2min before meals and at bedtime, up to QID 01/10/21   Eppie Gibson, MD  lidocaine-prilocaine (EMLA) cream Apply to affected area once 12/22/20   Benay Pike, MD  LORazepam (ATIVAN) 0.5 MG tablet Take 1 tablet (0.5 mg total) by mouth every 8 (eight) hours as needed. 01/17/21   Alla Feeling, NP  magic mouthwash w/lidocaine SOLN Swish and spit 5 mLs every 6 (six) hours as needed for Pain    [provider]  morphine (MS CONTIN) 15 MG 12 hr tablet Take 1 tablet (15 mg total) by mouth every 12 (twelve) hours. 01/14/21   Benay Pike, MD  Multiple Vitamin (MULTI-VITAMIN) tablet Take 1 tablet  by mouth daily. 12/09/20   [provider]  Multiple Vitamins-Minerals (MULTIVITAMIN WITH MINERALS) tablet Take 1 tablet by mouth daily.    [provider]  Nutritional Supplements (FEEDING SUPPLEMENT, OSMOLITE 1.5 CAL,) LIQD Give 7 cartons Osmolite 1.5 via PEG split into 4 feedings with 60 mL free water before and after.  Give 45 mL Prosource TF or equivalent 3 times daily with 30 mL free water before and after.  Give additional 720 mL free water via PEG daily.  Provides 2620 cal, 137.3 g protein, 2647 mL of free water/100% estimated nutrition needs. 01/05/21   Eppie Gibson, MD  ondansetron (ZOFRAN) 8 MG tablet Take 1  tablet (8 mg total) by mouth 2 (two) times daily as needed. Start on the third day after cisplatin chemotherapy. 12/22/20   Benay Pike, MD  oxyCODONE-acetaminophen (PERCOCET) 7.5-325 MG tablet Take 1 tablet by mouth every 6 (six) hours as needed for severe pain. 01/03/21   Benay Pike, MD  prochlorperazine (COMPAZINE) 10 MG tablet Take 1 tablet (10 mg total) by mouth every 6 (six) hours as needed (Nausea or vomiting). 12/22/20   Benay Pike, MD  fluticasone (FLONASE) 50 MCG/ACT nasal spray Place 2 sprays into both nostrils daily. 06/06/13 11/11/20  Roselee Culver, MD  sildenafil (VIAGRA) 100 MG tablet Take 1 tablet (100 mg total) by mouth daily as needed. 08/30/12 11/11/20  Mancel Bale, PA-C     Objective    Physical Exam: Vitals:   01/29/21 1603 01/29/21 1630 01/29/21 1645  BP: 110/84 107/66 109/80  Pulse: 72 65 69  Resp: 18 19 16   Temp: 98.1 F (36.7 C)    SpO2: 100% 97% 98%    General: appears to be stated age; alert, oriented Skin: warm, dry, no rash Head:  AT/[place] Mouth:  Oral mucosa membranes appear dry, normal dentition Neck: supple; trachea midline Heart:  RRR; did not appreciate any M/R/G Lungs: CTAB, did not appreciate any wheezes, rales, or rhonchi Abdomen: + BS; soft, ND, NT; G tube noted.  Vascular: 2+ pedal pulses b/l; 2+ radial pulses b/l Extremities: no peripheral edema, no muscle wasting Neuro: strength and sensation intact in upper and lower extremities b/l   Labs on Admission: I have personally reviewed following labs and imaging studies  CBC: Recent Labs  Lab 01/25/21 0750 01/29/21 1650  WBC 3.9* 3.1*  NEUTROABS 3.6 2.6  HGB 9.4* 8.2*  HCT 26.5* 23.5*  MCV 91.7 95.1  PLT 102* 449*   Basic Metabolic Panel: Recent Labs  Lab 01/25/21 0750 01/29/21 1650  NA 137 136  K 4.6 3.9  CL 102 102  CO2 26 26  GLUCOSE 188* 123*  BUN 23* 27*  CREATININE 1.04 1.07  CALCIUM 9.3 8.7*  MG 1.9  --    GFR: Estimated Creatinine Clearance: 101.9  mL/min (by C-G formula based on SCr of 1.07 mg/dL). Liver Function Tests: Recent Labs  Lab 01/29/21 1650  AST 25  ALT 39  ALKPHOS 53  BILITOT 1.5*  PROT 5.8*  ALBUMIN 3.5   Recent Labs  Lab 01/29/21 1650  LIPASE 25   No results for input(s): AMMONIA in the last 168 hours. Coagulation Profile: Recent Labs  Lab 01/29/21 1650  INR 1.1   Cardiac Enzymes: No results for input(s): CKTOTAL, CKMB, CKMBINDEX, TROPONINI in the last 168 hours. BNP (last 3 results) No results for input(s): PROBNP in the last 8760 hours. HbA1C: No results for input(s): HGBA1C in the last 72 hours. CBG: No results for input(s):  GLUCAP in the last 168 hours. Lipid Profile: No results for input(s): CHOL, HDL, LDLCALC, TRIG, CHOLHDL, LDLDIRECT in the last 72 hours. Thyroid Function Tests: No results for input(s): TSH, T4TOTAL, FREET4, T3FREE, THYROIDAB in the last 72 hours. Anemia Panel: No results for input(s): VITAMINB12, FOLATE, FERRITIN, TIBC, IRON, RETICCTPCT in the last 72 hours. Urine analysis: No results found for: COLORURINE, APPEARANCEUR, [place], [place], GLUCOSEU, [place], BILIRUBINUR, KETONESUR, PROTEINUR, UROBILINOGEN, NITRITE, LEUKOCYTESUR  Radiological Exams on Admission: DG Chest Port 1 View  Result Date: 01/29/2021 CLINICAL DATA:  Dyspnea EXAM: PORTABLE CHEST 1 VIEW COMPARISON:  November 11, 2020 FINDINGS: Right chest wall Port-A-Cath with tip overlying the superior cavoatrial junction. The heart size and mediastinal contours are within normal limits. Both lungs are clear. The visualized skeletal structures are unremarkable. IMPRESSION: No acute abnormality of the lungs in AP portable projection. Electronically Signed   By: Dahlia Bailiff MD   On: 01/29/2021 17:09     EKG: Independently reviewed, with result as described above.    Assessment/Plan   Gerald [name] is a 50 y.o. male with medical history significant for tonsillar squamous cell carcinoma with metastasis to cervical lymph  nodes undergoing chemotherapy, GERD, who is admitted to [facility] on 01/29/2021 with intractable nausea/vomiting after presenting from home to [facility] ED complaining of nausea vomiting.    Principal Problem:   Intractable nausea and vomiting Active Problems:   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HCC)   Dehydration   Pancytopenia (HCC)   GERD (gastroesophageal reflux disease)     #) Intractable nausea/vomiting: 1 day nausea resulting in at least 8-9 episodes of emesis, refractory to outpatient antiemetics, including home Zofran and Compazine as well as refractory to intravenously administered antiemetics at the outpatient infusion center earlier today, with associated inability to tolerate p.o. at this time, including inability to tolerate administration via gastrostomy tube, as further detailed above.  This is in the context of tonsillar squamous cell carcinoma for which the patient is undergoing chemotherapy, with most recent chemotherapy occurring on 01/26/2021 before development of the above symptoms within 2 days of completion of this most recent chemotherapy session.  The patient notes a "few flecks of blood" associated with one episode of emesis earlier today, with ensuing resolution via subsequent episodes of emesis, with no evidence of bright red blood per observation of emesis that occurred in the ED today.  Patient denies any associated coffee-ground appearance or overt blood clots associated with his emesis.  Overall, presentation reassuring from the absence of active GI bleed standpoint, including the absence of any recent melena or hematochezia.  In the context of known pancytopenia, present hemoglobin appears stable relative to most recent prechemotherapy labs, as further quantified below.  INR 1.1.  BUN without significant elevation relative to corresponding lab performed on 01/25/2021.  Aside from his nausea/vomiting, the patient is otherwise asymptomatic, denies any associated chest  pain, shortness breath, palpitations, diaphoresis, dizziness, presyncope, or syncope.  We will closely monitor ensuing electrolytes as well as serial hemoglobin levels to further evaluate, as further detailed below.   Plan: Lactated Ringer's at 125 cc/h.  As needed IV Ativan.  As needed IV Zofran for nausea/vomiting refractory to as needed IV Ativan.  Add on serum magnesium level.  Repeat hemoglobin in 4 hours and again in the morning.  Repeat INR in the morning as well.  CMP and CBC in the morning.  Monitor strict I's and O's and daily weights.      #) Dehydration: In the  setting of nausea/vomiting over the course the last and associated inability to tolerate p.o. or input via gastrostomy tube, as above, there is clinical, physical exam, and Jillian Pianka evidence to suggest mild dehydration, including appearance of dry oral mucosa membranes, prerenal azotemia, and borderline elevationof gravity on presenting urinalysis.  Does not appear to be associated with an acute kidney injury at this time, and no evidence of associated hypotension.  Plan: Further evaluation management presenting nausea/vomiting, as above, including prn antiemetics, and continuous IV fluids.  Add on serum magnesium level.  Repeat CMP in the morning.  Monitor strict I's and O's and daily weights.  Close monitoring of ensuing blood pressure via routine vital signs.      #) Pancytopenia: Documented history of such in the setting of ongoing chemotherapy for tonsillar squamous cell carcinoma, with most recent prior CBC on 01/25/2021, representing 1 day prior to his most recent chemotherapy on 01/26/21, notable for the following: White blood cell count 3900, hemoglobin 9.4, and platelet count 102.  Today CBC continues to reflect pancytopenia, similar to values observed on most recent prior CBC.  Of note, presenting white blood cell count of 3100 is associated with an absolute neutrophil count of 2600. Will repeat CBC with differential in the  morning to closely monitor ensuing values, including that of ensuing absolute neutrophil count.  No evidence of associated fever at this time.  No indication for PRBC transfusion at the present time, but will closely monitor ensuing Hbg trend, as further detailed above.  Plan: Repeat CBC with differential in the morning, including attention to interval absolute neutrophil count.  Repeat INR in the morning.      #) Tonsillar squamous cell carcinoma: Follows with Dr. Chryl Heck as his outpatient oncologist.  Undergoing chemotherapy, most recently on 01/26/21, with known pancytopenia, as further detailed above.  Has right-sided Port-A-Cath in place.  Additionally, underwent IR guided gastrostomy tube placement during the first week of June 2022, as further detailed above.  Plan: Additional management on outpatient basis via outpatient oncology.  Repeat CBC with differential in the morning.  Further evaluation and management of presenting nausea/vomiting, as above, including as needed antiemetics, as detailed above.       #) GERD: She reports that has been experiencing acid reflux, but notes that he is not on any H2 blocker or PPI as an outpatient.  Plan: Protonix, with IV administration given presenting nausea/vomiting.      DVT prophylaxis: scd's Code Status: Full code Family Communication: none Disposition Plan: Per Rounding Team Consults called: none  Admission status: Observation; med telemetry     Of note, this patient was added by me to the following Admit List/Treatment Team: mcadmits.      PLEASE NOTE THAT DRAGON DICTATION SOFTWARE WAS USED IN THE CONSTRUCTION OF THIS NOTE.   [place] [facility] Pager [phone] From [place]  Otherwise, please contact night-coverage  [email or web address] Password [facility]   01/29/2021, 6:27 PM

## 2021-01-30 DIAGNOSIS — X58XXXA Exposure to other specified factors, initial encounter: Secondary | ICD-10-CM | POA: Diagnosis present

## 2021-01-30 DIAGNOSIS — D61818 Other pancytopenia: Secondary | ICD-10-CM | POA: Diagnosis not present

## 2021-01-30 DIAGNOSIS — R1310 Dysphagia, unspecified: Secondary | ICD-10-CM | POA: Diagnosis present

## 2021-01-30 DIAGNOSIS — K219 Gastro-esophageal reflux disease without esophagitis: Secondary | ICD-10-CM | POA: Diagnosis present

## 2021-01-30 DIAGNOSIS — Z20822 Contact with and (suspected) exposure to covid-19: Secondary | ICD-10-CM | POA: Diagnosis present

## 2021-01-30 DIAGNOSIS — T451X5A Adverse effect of antineoplastic and immunosuppressive drugs, initial encounter: Secondary | ICD-10-CM | POA: Diagnosis present

## 2021-01-30 DIAGNOSIS — E669 Obesity, unspecified: Secondary | ICD-10-CM | POA: Diagnosis present

## 2021-01-30 DIAGNOSIS — G893 Neoplasm related pain (acute) (chronic): Secondary | ICD-10-CM | POA: Diagnosis present

## 2021-01-30 DIAGNOSIS — C099 Malignant neoplasm of tonsil, unspecified: Secondary | ICD-10-CM | POA: Diagnosis present

## 2021-01-30 DIAGNOSIS — Z79899 Other long term (current) drug therapy: Secondary | ICD-10-CM | POA: Diagnosis not present

## 2021-01-30 DIAGNOSIS — C770 Secondary and unspecified malignant neoplasm of lymph nodes of head, face and neck: Secondary | ICD-10-CM | POA: Diagnosis present

## 2021-01-30 DIAGNOSIS — G4730 Sleep apnea, unspecified: Secondary | ICD-10-CM | POA: Diagnosis present

## 2021-01-30 DIAGNOSIS — E860 Dehydration: Secondary | ICD-10-CM | POA: Diagnosis present

## 2021-01-30 DIAGNOSIS — R112 Nausea with vomiting, unspecified: Secondary | ICD-10-CM | POA: Diagnosis present

## 2021-01-30 DIAGNOSIS — D61810 Antineoplastic chemotherapy induced pancytopenia: Secondary | ICD-10-CM | POA: Diagnosis present

## 2021-01-30 DIAGNOSIS — D701 Agranulocytosis secondary to cancer chemotherapy: Secondary | ICD-10-CM | POA: Diagnosis present

## 2021-01-30 DIAGNOSIS — C109 Malignant neoplasm of oropharynx, unspecified: Secondary | ICD-10-CM

## 2021-01-30 DIAGNOSIS — Z79891 Long term (current) use of opiate analgesic: Secondary | ICD-10-CM | POA: Diagnosis not present

## 2021-01-30 DIAGNOSIS — Z931 Gastrostomy status: Secondary | ICD-10-CM | POA: Diagnosis not present

## 2021-01-30 DIAGNOSIS — Z6833 Body mass index (BMI) 33.0-33.9, adult: Secondary | ICD-10-CM | POA: Diagnosis not present

## 2021-01-30 DIAGNOSIS — D630 Anemia in neoplastic disease: Secondary | ICD-10-CM | POA: Diagnosis present

## 2021-01-30 LAB — PROTIME-INR
INR: 1.1 (ref 0.8–1.2)
Prothrombin Time: 14.2 seconds (ref 11.4–15.2)

## 2021-01-30 LAB — COMPREHENSIVE METABOLIC PANEL
ALT: 38 U/L (ref 0–44)
AST: 21 U/L (ref 15–41)
Albumin: 3 g/dL — ABNORMAL LOW (ref 3.5–5.0)
Alkaline Phosphatase: 47 U/L (ref 38–126)
Anion gap: 4 — ABNORMAL LOW (ref 5–15)
BUN: 23 mg/dL — ABNORMAL HIGH (ref 6–20)
CO2: 29 mmol/L (ref 22–32)
Calcium: 8.6 mg/dL — ABNORMAL LOW (ref 8.9–10.3)
Chloride: 103 mmol/L (ref 98–111)
Creatinine, Ser: 1.04 mg/dL (ref 0.61–1.24)
GFR, Estimated: >60 mL/min (ref >60)
Glucose, Bld: 94 mg/dL (ref 70–99)
Potassium: 3.9 mmol/L (ref 3.5–5.1)
Sodium: 136 mmol/L (ref 135–145)
Total Bilirubin: 1.3 mg/dL — ABNORMAL HIGH (ref 0.3–1.2)
Total Protein: 5.1 g/dL — ABNORMAL LOW (ref 6.5–8.1)

## 2021-01-30 LAB — CBC WITH DIFFERENTIAL/PLATELET
Abs Immature Granulocytes: 0.01 10*3/uL (ref 0.00–0.07)
Basophils Absolute: 0 10*3/uL (ref 0.0–0.1)
Basophils Relative: 0 %
Eosinophils Absolute: 0 10*3/uL (ref 0.0–0.5)
Eosinophils Relative: 1 %
HCT: 19.7 % — ABNORMAL LOW (ref 39.0–52.0)
Hemoglobin: 7 g/dL — ABNORMAL LOW (ref 13.0–17.0)
Immature Granulocytes: 1 %
Lymphocytes Relative: 21 %
Lymphs Abs: 0.4 10*3/uL — ABNORMAL LOW (ref 0.7–4.0)
MCH: 33.8 pg (ref 26.0–34.0)
MCHC: 35.5 g/dL (ref 30.0–36.0)
MCV: 95.2 fL (ref 80.0–100.0)
Monocytes Absolute: 0.2 10*3/uL (ref 0.1–1.0)
Monocytes Relative: 9 %
Neutro Abs: 1.4 10*3/uL — ABNORMAL LOW (ref 1.7–7.7)
Neutrophils Relative %: 68 %
Platelets: 90 10*3/uL — ABNORMAL LOW (ref 150–400)
RBC: 2.07 MIL/uL — ABNORMAL LOW (ref 4.22–5.81)
RDW: 12.7 % (ref 11.5–15.5)
WBC: 2 10*3/uL — ABNORMAL LOW (ref 4.0–10.5)
nRBC: 0 % (ref 0.0–0.2)

## 2021-01-30 LAB — HEMOGLOBIN AND HEMATOCRIT, BLOOD
HCT: 20.1 % — ABNORMAL LOW (ref 39.0–52.0)
Hemoglobin: 7.1 g/dL — ABNORMAL LOW (ref 13.0–17.0)

## 2021-01-30 LAB — HIV ANTIBODY (ROUTINE TESTING W REFLEX): HIV Screen 4th Generation wRfx: NONREACTIVE

## 2021-01-30 LAB — MAGNESIUM: Magnesium: 2 mg/dL (ref 1.7–2.4)

## 2021-01-30 MED ORDER — OSMOLITE 1.5 CAL PO LIQD
1000.0000 mL | ORAL | Status: DC
  Administered 2021-01-30: 1000 mL
  Filled 2021-01-30: qty 1000

## 2021-01-30 MED ORDER — SCOPOLAMINE 1 MG/3DAYS TD PT72
1.0000 | MEDICATED_PATCH | TRANSDERMAL | Status: DC
  Administered 2021-01-30: 1.5 mg via TRANSDERMAL
  Filled 2021-01-30 (×2): qty 1

## 2021-01-30 MED ORDER — GLYCOPYRROLATE 0.2 MG/ML IJ SOLN
0.2000 mg | Freq: Once | INTRAMUSCULAR | Status: AC
  Administered 2021-01-30: 0.2 mg via INTRAVENOUS
  Filled 2021-01-30: qty 1

## 2021-01-30 MED ORDER — FREE WATER
30.0000 mL | Status: DC
  Administered 2021-01-30 – 2021-02-08 (×48): 30 mL

## 2021-01-30 NOTE — Progress Notes (Signed)
Noted hemoglobin result--7.0 Notified on call X. Blount,NP via text page.

## 2021-01-30 NOTE — Progress Notes (Signed)
PROGRESS NOTE    Gerald [name]  XVQ:[id number] DOB: 1970-08-19 DOA: 01/29/2021 PCP: Pcp, No   Brief Narrative: Gerald [name] is a 50 y.o. 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 Patient presented secondary to intractable nausea and vomiting. Started on IV fluids and IV antiemetics.   Assessment & Plan:   Principal Problem:   Intractable nausea and vomiting Active Problems:   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HCC)   Dehydration   Pancytopenia (HCC)   GERD (gastroesophageal reflux disease)   Intractable nausea and vomiting Patient with multiple episodes of emesis refractory to outpatient Zofran/compazine. He was started on IV fluids inpatient in addition to IV antiemetics. He believes most of his vomiting is related to secretions, but he also was unable to tolerate tube feeds. He is currently receiving chemotherapy treatment with last treatment on 7/6. -Continue IV fluids -Continue Zofran IV prn in addition to Ativan IV prn for nausea -Scopolamine patch  Dehydration Secondary to above. Patient started on IV fluids.  Pancytopenia Likely secondary to chemotherapy. Associated ANC of 1400 today. Afebrile. -Daily CBC with differential  Tonsillar squamous cell carcinoma Patient follows with Dr. Chryl Heck as an outpatient and is currently receiving chemotherapy for treatment. Previously was receiving radiation therapy. Patient has a gastrostomy tube in place secondary to cancer/radiation history.  GERD -Continue Protonix  DVT prophylaxis: SCDs Code Status:   Code Status: Full Code Family Communication: None at bedside Disposition Plan: Discharge home likely in 2-4 days pending ability to tolerate oral intake   Consultants:  Medical oncology  Procedures:  None  Antimicrobials: None    Subjective: Patient reports a lot of oral secretions that cause him to gag and throw up. He also did mention emesis when  trying to use his PEG tube, however.  Objective: Vitals:   01/29/21 1815 01/29/21 2125 01/30/21 0114 01/30/21 0537  BP: 101/65 102/66 (!) 98/53 106/69  Pulse: (!) 57 (!) 55 (!) 55 62  Resp: 16 16 16 17   Temp:  98.4 F (36.9 C) 98.4 F (36.9 C) 98.8 F (37.1 C)  TempSrc:  Oral Oral Oral  SpO2: 99% 98% 97% 98%    Intake/Output Summary (Last 24 hours) at 01/30/2021 1314 Last data filed at 01/30/2021 0944 Gross per 24 hour  Intake 1557.22 ml  Output 1375 ml  Net 182.22 ml   There were no vitals filed for this visit.  Examination:  General exam: Appears calm and comfortable  Respiratory system: Clear to auscultation. Respiratory effort normal. Cardiovascular system: S1 & S2 heard, RRR. No murmurs, rubs, gallops or clicks. Gastrointestinal system: Abdomen is nondistended, soft and nontender. No organomegaly or masses felt. Normal bowel sounds heard. Central nervous system: Alert and oriented. No focal neurological deficits. Musculoskeletal: No edema. No calf tenderness Skin: No cyanosis. No rashes Psychiatry: Judgement and insight appear normal. Mood & affect appropriate.     Data Reviewed: I have personally reviewed following labs and imaging studies  CBC Lab Results  Component Value Date   WBC 2.0 (L) 01/30/2021   RBC 2.07 (L) 01/30/2021   HGB 7.0 (L) 01/30/2021   HCT 19.7 (L) 01/30/2021   MCV 95.2 01/30/2021   MCH 33.8 01/30/2021   PLT 90 (L) 01/30/2021   MCHC 35.5 01/30/2021   RDW 12.7 01/30/2021   LYMPHSABS 0.4 (L) 01/30/2021   MONOABS 0.2 01/30/2021   EOSABS 0.0 01/30/2021   BASOSABS 0.0 09/32/6712     Last metabolic panel Lab Results  Component Value Date   NA 136 01/30/2021   K 3.9 01/30/2021   CL 103 01/30/2021   CO2 29 01/30/2021   BUN 23 (H) 01/30/2021   CREATININE 1.04 01/30/2021   GLUCOSE 94 01/30/2021   GFRNONAA >60 01/30/2021   CALCIUM 8.6 (L) 01/30/2021   PROT 5.1 (L) 01/30/2021   ALBUMIN 3.0 (L) 01/30/2021   BILITOT 1.3 (H) 01/30/2021    ALKPHOS 47 01/30/2021   AST 21 01/30/2021   ALT 38 01/30/2021   ANIONGAP 4 (L) 01/30/2021    CBG (last 3)  No results for input(s): GLUCAP in the last 72 hours.   GFR: Estimated Creatinine Clearance: 104.8 mL/min (by C-G formula based on SCr of 1.04 mg/dL).  Coagulation Profile: Recent Labs  Lab 01/29/21 1650 01/30/21 0353  INR 1.1 1.1    Recent Results (from the past 240 hour(s))  Resp Panel by RT-PCR (Flu A&B, Covid) Nasopharyngeal Swab     Status: None   Collection Time: 01/29/21  6:26 PM   Specimen: Nasopharyngeal Swab; Nasopharyngeal(NP) swabs in vial transport medium  Result Value Ref Range Status   SARS Coronavirus 2 by RT PCR NEGATIVE NEGATIVE Final    Comment: (NOTE) SARS-CoV-2 target nucleic acids are NOT DETECTED.  The SARS-CoV-2 RNA is generally detectable in upper respiratory specimens during the acute phase of infection. The lowest concentration of SARS-CoV-2 viral copies this assay can detect is 138 copies/mL. A negative result does not preclude SARS-Cov-2 infection and should not be used as the sole basis for treatment or other patient management decisions. A negative result may occur with  improper specimen collection/handling, submission of specimen other than nasopharyngeal swab, presence of viral mutation(s) within the areas targeted by this assay, and inadequate number of viral copies(<138 copies/mL). A negative result must be combined with clinical observations, patient history, and epidemiological information. The expected result is Negative.  Fact Sheet for Patients:  EntrepreneurPulse.com.au  Fact Sheet for Healthcare Providers:  IncredibleEmployment.be  This test is no t yet approved or cleared by the [place] FDA and  has been authorized for detection and/or diagnosis of SARS-CoV-2 by FDA under an Emergency Use Authorization (EUA). This EUA will remain  in effect (meaning this test can be used)  for the duration of the COVID-19 declaration under Section 564(b)(1) of the Act, 21 U.S.C.section 360bbb-3(b)(1), unless the authorization is terminated  or revoked sooner.       Influenza A by PCR NEGATIVE NEGATIVE Final   Influenza B by PCR NEGATIVE NEGATIVE Final    Comment: (NOTE) The Xpert Xpress SARS-CoV-2/FLU/RSV plus assay is intended as an aid in the diagnosis of influenza from Nasopharyngeal swab specimens and should not be used as a sole basis for treatment. Nasal washings and aspirates are unacceptable for Xpert Xpress SARS-CoV-2/FLU/RSV testing.  Fact Sheet for Patients: EntrepreneurPulse.com.au  Fact Sheet for Healthcare Providers: IncredibleEmployment.be  This test is not yet approved or cleared by the [place] FDA and has been authorized for detection and/or diagnosis of SARS-CoV-2 by FDA under an Emergency Use Authorization (EUA). This EUA will remain in effect (meaning this test can be used) for the duration of the COVID-19 declaration under Section 564(b)(1) of the Act, 21 U.S.C. section 360bbb-3(b)(1), unless the authorization is terminated or revoked.  Performed at [facility], [place] [address] [place], [place] [address]         Radiology Studies: DG Chest Port 1 View  Result Date: 01/29/2021 CLINICAL DATA:  Dyspnea EXAM: PORTABLE CHEST  1 VIEW COMPARISON:  November 11, 2020 FINDINGS: Right chest wall Port-A-Cath with tip overlying the superior cavoatrial junction. The heart size and mediastinal contours are within normal limits. Both lungs are clear. The visualized skeletal structures are unremarkable. IMPRESSION: No acute abnormality of the lungs in AP portable projection. Electronically Signed   By: Dahlia Bailiff MD   On: 01/29/2021 17:09        Scheduled Meds:  Chlorhexidine Gluconate Cloth  6 each Topical Daily    morphine injection  4 mg Intravenous Once   pantoprazole (PROTONIX) IV  40 mg  Intravenous Q24H   scopolamine  1 patch Transdermal Q72H   sodium chloride flush  10-40 mL Intracatheter Q12H   Continuous Infusions:  lactated ringers 20 mL/hr (01/30/21 1213)     LOS: 0 days     Cordelia Poche, MD [facility] 01/30/2021, 1:14 PM  If 7PM-7AM, please contact night-coverage [email or web address]

## 2021-01-30 NOTE — Progress Notes (Addendum)
Initial Nutrition Assessment  DOCUMENTATION CODES:   Obesity unspecified  INTERVENTION:   Osmolite 1.5@70ml /hr- Initiate at 22ml/hr; once tolerating, advance by 97ml/hr q 8 hours until goal rate is reached.   Pro-Source 61ml BID via tube, provides 40kcal and 11g of protein per serving   Free water flushes 79ml q4 hours to maintain tube patency   Regimen provides 2600kcal/day, 127g/day protein and 1447ml/day free water.   NUTRITION DIAGNOSIS:   Inadequate oral intake related to cancer and cancer related treatments as evidenced by other (comment) (pt with G-tube).  GOAL:   Patient will meet greater than or equal to 90% of their needs  MONITOR:   PO intake, Labs, Weight trends, TF tolerance, Skin, I & O's  REASON FOR ASSESSMENT:   Consult Enteral/tube feeding initiation and management, Assessment of nutrition requirement/status  ASSESSMENT:   50 y.o. 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  Pt s/p IR placement of 20 French pull-through gastrostomy Tube 12/24/2020  RD working remotely.  Spoke with pt via phone. Pt reports poor appetite and oral intake pta r/t nausea and vomiting. Pt reports that he continues to have nausea and vomiting today. Pt with with G-tube in place. Pt reports that he does not eat by mouth r/t pain and nausea. Pt reports that he has been unable to tolerate his tube feeds at home r/t nausea and vomiting. Pt reports that he will do well at times, but then will suffer another set back. Pt is followed by the Dietitian at the [facility]. Pt's goal rate is 7 cartons of Osmolite 1.5 daily, 3 protein modulars daily and 1510ml of free water flushes daily (180ml with tube feeds and 244ml TID in between feeds). Pt reports that he has not been able to reach his goal  rate on tube feeds. Per chart, pt appears to be down ~23lbs(10%) since having his G-tube placed; this is significant weight loss. RD discussed continuous tube feeds with pt. Pt is willing to do continuous feeds until he is able to tolerate bolus feeds. Would recommend consideration of reglan or erythromycin to help with gastric emptying; discussed with MD. Pt currently on clear liquids but is not taking in anything by mouth. Pt is at high refeed risk. RD will obtain nutrition related exam at follow-up.   Medications reviewed and include: protonix, scopalamine, LRS @125ml /hr   Labs reviewed: K 3.9 wnl, BUN 27(H), Mg 1.9 wnl Wbc- 2.0(L), Hgb 7.0(L), Hct 19.7(L)  NUTRITION - FOCUSED PHYSICAL EXAM: Unable to perform at this time   Diet Order:   Diet Order             Diet clear liquid Room service appropriate? Yes; Fluid consistency: Thin  Diet effective now                  EDUCATION NEEDS:   No education needs have been identified at this time  Skin:  Skin Assessment: Reviewed RN Assessment  Last BM:  pta  Height:   Ht Readings from Last 1 Encounters:  01/25/21 5\' 10"  (1.778 m)    Weight:   Wt Readings from Last 1 Encounters:  01/25/21 103.8 kg    Ideal Body Weight:  75.45 kg  BMI:  There is no height or weight on file to calculate BMI.  Estimated Nutritional Needs:   Kcal:  2400-2700kcal/day  Protein:  >120g/day  Fluid:  2.3-2.6L/day  Koleen Distance MS, RD, LDN Please refer to [facility] for RD and/or RD on-call/weekend/after hours pager

## 2021-01-30 NOTE — Progress Notes (Signed)
Patient has refused IVF to infuse at ordered rate 125/hr. States that "it is making me have increased secretions". Dr Lonny Prude informed and would like infusion running at 125/hr Patient re-educated but declined. IVF running at 63ml/hr.

## 2021-01-31 ENCOUNTER — Ambulatory Visit: Payer: Managed Care, Other (non HMO)

## 2021-01-31 ENCOUNTER — Ambulatory Visit: Payer: Managed Care, Other (non HMO) | Admitting: Hematology and Oncology

## 2021-01-31 ENCOUNTER — Other Ambulatory Visit: Payer: Self-pay | Admitting: Hematology and Oncology

## 2021-01-31 ENCOUNTER — Other Ambulatory Visit: Payer: Managed Care, Other (non HMO)

## 2021-01-31 DIAGNOSIS — C109 Malignant neoplasm of oropharynx, unspecified: Secondary | ICD-10-CM | POA: Diagnosis not present

## 2021-01-31 DIAGNOSIS — E860 Dehydration: Secondary | ICD-10-CM | POA: Diagnosis not present

## 2021-01-31 DIAGNOSIS — R112 Nausea with vomiting, unspecified: Secondary | ICD-10-CM | POA: Diagnosis not present

## 2021-01-31 DIAGNOSIS — D61818 Other pancytopenia: Secondary | ICD-10-CM | POA: Diagnosis not present

## 2021-01-31 LAB — CBC WITH DIFFERENTIAL/PLATELET
Abs Immature Granulocytes: 0 10*3/uL (ref 0.00–0.07)
Basophils Absolute: 0 10*3/uL (ref 0.0–0.1)
Basophils Relative: 0 %
Eosinophils Absolute: 0 10*3/uL (ref 0.0–0.5)
Eosinophils Relative: 2 %
HCT: 19.7 % — ABNORMAL LOW (ref 39.0–52.0)
Hemoglobin: 7 g/dL — ABNORMAL LOW (ref 13.0–17.0)
Immature Granulocytes: 0 %
Lymphocytes Relative: 20 %
Lymphs Abs: 0.3 10*3/uL — ABNORMAL LOW (ref 0.7–4.0)
MCH: 33.2 pg (ref 26.0–34.0)
MCHC: 35.5 g/dL (ref 30.0–36.0)
MCV: 93.4 fL (ref 80.0–100.0)
Monocytes Absolute: 0.1 10*3/uL (ref 0.1–1.0)
Monocytes Relative: 11 %
Neutro Abs: 0.9 10*3/uL — ABNORMAL LOW (ref 1.7–7.7)
Neutrophils Relative %: 67 %
Platelets: 96 10*3/uL — ABNORMAL LOW (ref 150–400)
RBC: 2.11 MIL/uL — ABNORMAL LOW (ref 4.22–5.81)
RDW: 12.8 % (ref 11.5–15.5)
WBC: 1.3 10*3/uL — CL (ref 4.0–10.5)
nRBC: 0 % (ref 0.0–0.2)

## 2021-01-31 MED ORDER — OSMOLITE 1.5 CAL PO LIQD
1000.0000 mL | ORAL | Status: DC
  Administered 2021-02-02: 1000 mL
  Filled 2021-01-31 (×3): qty 1000

## 2021-01-31 MED ORDER — LIDOCAINE VISCOUS HCL 2 % MT SOLN
15.0000 mL | OROMUCOSAL | Status: DC | PRN
  Administered 2021-01-31 – 2021-02-06 (×4): 15 mL via OROMUCOSAL
  Filled 2021-01-31 (×6): qty 15

## 2021-01-31 MED ORDER — PROMETHAZINE HCL 6.25 MG/5ML PO SYRP
25.0000 mg | ORAL_SOLUTION | Freq: Once | ORAL | Status: DC
  Filled 2021-01-31: qty 20

## 2021-01-31 MED ORDER — OXYCODONE-ACETAMINOPHEN 7.5-325 MG PO TABS
1.0000 | ORAL_TABLET | Freq: Four times a day (QID) | ORAL | Status: DC | PRN
  Administered 2021-02-01 – 2021-02-07 (×9): 1 via ORAL
  Filled 2021-01-31 (×9): qty 1

## 2021-01-31 MED ORDER — MORPHINE SULFATE ER 15 MG PO TBCR: 15.0000 mg | EXTENDED_RELEASE_TABLET | Freq: Two times a day (BID) | ORAL | Status: DC

## 2021-01-31 MED ORDER — MORPHINE SULFATE (PF) 4 MG/ML IV SOLN
4.0000 mg | INTRAVENOUS | Status: DC | PRN
  Administered 2021-01-31 – 2021-02-08 (×40): 4 mg via INTRAVENOUS
  Filled 2021-01-31 (×40): qty 1

## 2021-01-31 MED ORDER — SCOPOLAMINE 1 MG/3DAYS TD PT72
1.0000 | MEDICATED_PATCH | TRANSDERMAL | Status: DC
  Administered 2021-01-31 – 2021-02-02 (×2): 1.5 mg via TRANSDERMAL
  Filled 2021-01-31: qty 1

## 2021-01-31 MED ORDER — PROMETHAZINE HCL 6.25 MG/5ML PO SYRP
25.0000 mg | ORAL_SOLUTION | Freq: Once | ORAL | Status: AC
  Administered 2021-01-31: 25 mg
  Filled 2021-01-31: qty 20

## 2021-01-31 MED ORDER — PROSOURCE TF PO LIQD
45.0000 mL | Freq: Two times a day (BID) | ORAL | Status: DC
  Administered 2021-02-02 (×2): 45 mL
  Filled 2021-01-31 (×5): qty 45

## 2021-01-31 MED ORDER — LORATADINE 10 MG PO TABS
10.0000 mg | ORAL_TABLET | Freq: Every day | ORAL | Status: DC
  Administered 2021-01-31 – 2021-02-08 (×9): 10 mg
  Filled 2021-01-31 (×9): qty 1

## 2021-01-31 NOTE — Progress Notes (Signed)
I called patient and talked to him He complains of nausea and feeling very weak. He is getting continuous G tube feeding,  He understands that we cant do chemo tomorrow because of neutropenic precautions. I will try to see him tomorrow morning No indications for growth factor support despite neutropenia Please follow neutropenic precautions Unless concern for infection, no indication for prophylactic abx.  Gerald [name]

## 2021-01-31 NOTE — Progress Notes (Signed)
    BRIEF OVERNIGHT PROGRESS REPORT  Notified by RN for patient concern over IVF rate. Patient refused to have fluid at a rate greater than 57mL/hr saying that "it makes me nauseas and throw up". Patient is aware of reason for fluid and requests rate to be lowered.  [name] MSNA ACNPC-AG Acute Care Nurse Practitioner [place]

## 2021-01-31 NOTE — Progress Notes (Signed)
Patient refused IVF to run at 149ml/hr states "more fluids makes me vomit/throw up". Also,BP is 90/67, denies any headache/dizziness at this time. Clarene Essex, NP made aware. New order received and noted.  Call bell within reach and will continue to monitor.

## 2021-01-31 NOTE — Progress Notes (Signed)
Patient refusing for IV fluids to run at 125. Currently running at 97 per patients request. Dr. Teryl Lucy notified.

## 2021-01-31 NOTE — Progress Notes (Signed)
PROGRESS NOTE    Gerald [name]  ZJI:[id number] DOB: 14-Apr-1971 DOA: 01/29/2021 PCP: Pcp, No   Brief Narrative: Gerald [name] is a 50 y.o. 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 Patient presented secondary to intractable nausea and vomiting. Started on IV fluids and IV antiemetics.   Assessment & Plan:   Principal Problem:   Intractable nausea and vomiting Active Problems:   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HCC)   Dehydration   Pancytopenia (HCC)   GERD (gastroesophageal reflux disease)   Intractable nausea and vomiting Patient with multiple episodes of emesis refractory to outpatient Zofran/compazine. He was started on IV fluids inpatient in addition to IV antiemetics. He believes most of his vomiting is related to secretions, but he also was unable to tolerate tube feeds. He is currently receiving chemotherapy treatment with last treatment on 7/6. Given a dose of Robinul last night. -Continue IV fluids -Continue Zofran IV prn in addition to Ativan IV prn for nausea -Scopolamine patch -Lidocaine for throat pain  Dehydration Secondary to above. Patient started on IV fluids.  Pancytopenia Likely secondary to chemotherapy. Associated ANC of 900 today. Afebrile. -Daily CBC with differential -Medical oncology recommendations today  Tonsillar squamous cell carcinoma Patient follows with Dr. Chryl Heck as an outpatient and is currently receiving chemotherapy for treatment. Currently receiving radiation therapy. Patient has a gastrostomy tube in place secondary to cancer/radiation history. -Medical oncology to see  Tube feeds Patient is on chronic tube feeds. Dietitian consulted and have recommended continuous tube feed. Continues feeds started 7/10 with plan to up-titrate rate.  GERD -Continue Protonix  Obesity Body mass index is 33.5 kg/m.  DVT prophylaxis: SCDs Code Status:   Code Status: Full Code Family  Communication: None at bedside Disposition Plan: Discharge home likely in 2-4 days pending ability to tolerate oral intake   Consultants:  Medical oncology  Procedures:  None  Antimicrobials: None    Subjective: Vomiting has improved a bit. Still with some excessive secretions. Throat pain.  Objective: Vitals:   01/30/21 2139 01/31/21 0104 01/31/21 0508 01/31/21 0848  BP: 101/71 (!) 85/56 91/60 101/70  Pulse: 81 72 68 71  Resp: 18 18 18 16   Temp: 99 F (37.2 C) 98.3 F (36.8 C) 98.9 F (37.2 C) 98.5 F (36.9 C)  TempSrc: Oral Oral Oral Oral  SpO2: 97% 98% 96% 98%  Weight:   105.9 kg     Intake/Output Summary (Last 24 hours) at 01/31/2021 1004 Last data filed at 01/31/2021 0900 Gross per 24 hour  Intake 96.52 ml  Output 900 ml  Net -803.48 ml    Filed Weights   01/31/21 0508  Weight: 105.9 kg    Examination:  General exam: Appears calm and comfortable Respiratory system: Clear to auscultation. Respiratory effort normal. Cardiovascular system: S1 & S2 heard, RRR. No murmurs, rubs, gallops or clicks. Gastrointestinal system: Abdomen is obese, nondistended, soft and nontender. No organomegaly or masses felt. Normal bowel sounds heard. Central nervous system: Alert and oriented. No focal neurological deficits. Musculoskeletal: No edema. No calf tenderness Skin: No cyanosis. No rashes Psychiatry: Judgement and insight appear normal. Mood & affect appropriate.     Data Reviewed: I have personally reviewed following labs and imaging studies  CBC Lab Results  Component Value Date   WBC 1.3 (LL) 01/31/2021   RBC 2.11 (L) 01/31/2021   HGB 7.0 (L) 01/31/2021   HCT 19.7 (L) 01/31/2021   MCV 93.4 01/31/2021   MCH 33.2  01/31/2021   PLT 96 (L) 01/31/2021   MCHC 35.5 01/31/2021   RDW 12.8 01/31/2021   LYMPHSABS 0.3 (L) 01/31/2021   MONOABS 0.1 01/31/2021   EOSABS 0.0 01/31/2021   BASOSABS 0.0 88/50/2774     Last metabolic panel Lab Results  Component  Value Date   NA 136 01/30/2021   K 3.9 01/30/2021   CL 103 01/30/2021   CO2 29 01/30/2021   BUN 23 (H) 01/30/2021   CREATININE 1.04 01/30/2021   GLUCOSE 94 01/30/2021   GFRNONAA >60 01/30/2021   CALCIUM 8.6 (L) 01/30/2021   PROT 5.1 (L) 01/30/2021   ALBUMIN 3.0 (L) 01/30/2021   BILITOT 1.3 (H) 01/30/2021   ALKPHOS 47 01/30/2021   AST 21 01/30/2021   ALT 38 01/30/2021   ANIONGAP 4 (L) 01/30/2021    CBG (last 3)  No results for input(s): GLUCAP in the last 72 hours.   GFR: Estimated Creatinine Clearance: 105.9 mL/min (by C-G formula based on SCr of 1.04 mg/dL).  Coagulation Profile: Recent Labs  Lab 01/29/21 1650 01/30/21 0353  INR 1.1 1.1     Recent Results (from the past 240 hour(s))  Resp Panel by RT-PCR (Flu A&B, Covid) Nasopharyngeal Swab     Status: None   Collection Time: 01/29/21  6:26 PM   Specimen: Nasopharyngeal Swab; Nasopharyngeal(NP) swabs in vial transport medium  Result Value Ref Range Status   SARS Coronavirus 2 by RT PCR NEGATIVE NEGATIVE Final    Comment: (NOTE) SARS-CoV-2 target nucleic acids are NOT DETECTED.  The SARS-CoV-2 RNA is generally detectable in upper respiratory specimens during the acute phase of infection. The lowest concentration of SARS-CoV-2 viral copies this assay can detect is 138 copies/mL. A negative result does not preclude SARS-Cov-2 infection and should not be used as the sole basis for treatment or other patient management decisions. A negative result may occur with  improper specimen collection/handling, submission of specimen other than nasopharyngeal swab, presence of viral mutation(s) within the areas targeted by this assay, and inadequate number of viral copies(<138 copies/mL). A negative result must be combined with clinical observations, patient history, and epidemiological information. The expected result is Negative.  Fact Sheet for Patients:  EntrepreneurPulse.com.au  Fact Sheet for  Healthcare Providers:  IncredibleEmployment.be  This test is no t yet approved or cleared by the [place] FDA and  has been authorized for detection and/or diagnosis of SARS-CoV-2 by FDA under an Emergency Use Authorization (EUA). This EUA will remain  in effect (meaning this test can be used) for the duration of the COVID-19 declaration under Section 564(b)(1) of the Act, 21 U.S.C.section 360bbb-3(b)(1), unless the authorization is terminated  or revoked sooner.       Influenza A by PCR NEGATIVE NEGATIVE Final   Influenza B by PCR NEGATIVE NEGATIVE Final    Comment: (NOTE) The Xpert Xpress SARS-CoV-2/FLU/RSV plus assay is intended as an aid in the diagnosis of influenza from Nasopharyngeal swab specimens and should not be used as a sole basis for treatment. Nasal washings and aspirates are unacceptable for Xpert Xpress SARS-CoV-2/FLU/RSV testing.  Fact Sheet for Patients: EntrepreneurPulse.com.au  Fact Sheet for Healthcare Providers: IncredibleEmployment.be  This test is not yet approved or cleared by the [place] FDA and has been authorized for detection and/or diagnosis of SARS-CoV-2 by FDA under an Emergency Use Authorization (EUA). This EUA will remain in effect (meaning this test can be used) for the duration of the COVID-19 declaration under Section 564(b)(1) of the Act, 21 U.S.C.  section 360bbb-3(b)(1), unless the authorization is terminated or revoked.  Performed at [facility], [place] [address], [place], [place] [address]          Radiology Studies: DG Chest Port 1 View  Result Date: 01/29/2021 CLINICAL DATA:  Dyspnea EXAM: PORTABLE CHEST 1 VIEW COMPARISON:  November 11, 2020 FINDINGS: Right chest wall Port-A-Cath with tip overlying the superior cavoatrial junction. The heart size and mediastinal contours are within normal limits. Both lungs are clear. The visualized skeletal structures are  unremarkable. IMPRESSION: No acute abnormality of the lungs in AP portable projection. Electronically Signed   By: Dahlia Bailiff MD   On: 01/29/2021 17:09        Scheduled Meds:  Chlorhexidine Gluconate Cloth  6 each Topical Daily   feeding supplement (OSMOLITE 1.5 CAL)  1,000 mL Per Tube Q24H   free water  30 mL Per Tube Q4H   pantoprazole (PROTONIX) IV  40 mg Intravenous Q24H   scopolamine  1 patch Transdermal Q72H   sodium chloride flush  10-40 mL Intracatheter Q12H   Continuous Infusions:  lactated ringers 125 mL/hr at 01/31/21 0207     LOS: 1 day     Cordelia Poche, MD [facility] 01/31/2021, 10:04 AM  If 7PM-7AM, please contact night-coverage [email or web address]

## 2021-01-31 NOTE — Progress Notes (Signed)
Critical WBC of 1.3 called on the patient. MD paged to notify.

## 2021-01-31 NOTE — Progress Notes (Signed)
Oncology Nurse Navigator Documentation   I received a voice mail from Gerald [name] wife that he was admitted to [facility] on 7/9 for intractable vomiting. He is receiving weekly chemotherapy and daily radiation treatments for head and neck cancer. I notified Dr. Chryl Heck of his admission and his appointments today with her and tomorrow for chemotherapy have been cancelled. I have also notified Dr. Sondra Come (covering for Dr. Isidore Moos) Radiation Oncologist of his admission at [facility] and we will hold his radiation treatment today due to vomiting and discuss either transfer to [facility] hospital to continue radiation or daily transportation to the [facility] center for his radiation treatments. We will reassess tomorrow. I have notified both Gerald [name] and his wife of the above plan and they are agreeable. They know to call me if needed today.  Harlow Asa RN, BSN, OCN Head & Neck Oncology Nurse [place] at [facility] Phone # [phone]  Fax # [phone]

## 2021-02-01 ENCOUNTER — Encounter: Payer: Managed Care, Other (non HMO) | Admitting: Nutrition

## 2021-02-01 ENCOUNTER — Ambulatory Visit: Payer: Managed Care, Other (non HMO)

## 2021-02-01 ENCOUNTER — Other Ambulatory Visit: Payer: Self-pay

## 2021-02-01 DIAGNOSIS — R112 Nausea with vomiting, unspecified: Secondary | ICD-10-CM | POA: Diagnosis not present

## 2021-02-01 DIAGNOSIS — E860 Dehydration: Secondary | ICD-10-CM | POA: Diagnosis not present

## 2021-02-01 DIAGNOSIS — D61818 Other pancytopenia: Secondary | ICD-10-CM | POA: Diagnosis not present

## 2021-02-01 DIAGNOSIS — C109 Malignant neoplasm of oropharynx, unspecified: Secondary | ICD-10-CM | POA: Diagnosis not present

## 2021-02-01 LAB — CBC WITH DIFFERENTIAL/PLATELET
Abs Immature Granulocytes: 0.01 10*3/uL (ref 0.00–0.07)
Basophils Absolute: 0 10*3/uL (ref 0.0–0.1)
Basophils Relative: 0 %
Eosinophils Absolute: 0 10*3/uL (ref 0.0–0.5)
Eosinophils Relative: 2 %
HCT: 20.2 % — ABNORMAL LOW (ref 39.0–52.0)
Hemoglobin: 7.2 g/dL — ABNORMAL LOW (ref 13.0–17.0)
Immature Granulocytes: 1 %
Lymphocytes Relative: 18 %
Lymphs Abs: 0.2 10*3/uL — ABNORMAL LOW (ref 0.7–4.0)
MCH: 33.5 pg (ref 26.0–34.0)
MCHC: 35.6 g/dL (ref 30.0–36.0)
MCV: 94 fL (ref 80.0–100.0)
Monocytes Absolute: 0.1 10*3/uL (ref 0.1–1.0)
Monocytes Relative: 10 %
Neutro Abs: 0.9 10*3/uL — ABNORMAL LOW (ref 1.7–7.7)
Neutrophils Relative %: 69 %
Platelets: 106 10*3/uL — ABNORMAL LOW (ref 150–400)
RBC: 2.15 MIL/uL — ABNORMAL LOW (ref 4.22–5.81)
RDW: 12.8 % (ref 11.5–15.5)
WBC: 1.3 10*3/uL — CL (ref 4.0–10.5)
nRBC: 0 % (ref 0.0–0.2)

## 2021-02-01 LAB — PATHOLOGIST SMEAR REVIEW

## 2021-02-01 MED ORDER — SODIUM CHLORIDE 3 % IN NEBU
4.0000 mL | INHALATION_SOLUTION | Freq: Once | RESPIRATORY_TRACT | Status: DC
  Filled 2021-02-01: qty 4

## 2021-02-01 MED ORDER — METOCLOPRAMIDE HCL 10 MG/10ML PO SOLN
5.0000 mg | Freq: Four times a day (QID) | ORAL | Status: DC
  Administered 2021-02-01 – 2021-02-08 (×27): 5 mg
  Filled 2021-02-01 (×2): qty 10
  Filled 2021-02-01: qty 5
  Filled 2021-02-01 (×2): qty 10
  Filled 2021-02-01: qty 5
  Filled 2021-02-01 (×23): qty 10
  Filled 2021-02-01: qty 5

## 2021-02-01 MED ORDER — GUAIFENESIN 100 MG/5ML PO SOLN
15.0000 mL | Freq: Four times a day (QID) | ORAL | Status: AC | PRN
  Filled 2021-02-01: qty 15

## 2021-02-01 MED ORDER — GUAIFENESIN 100 MG/5ML PO SOLN
15.0000 mL | Freq: Four times a day (QID) | ORAL | Status: AC
Start: 2021-02-01 — End: 2021-02-03
  Administered 2021-02-01 – 2021-02-03 (×8): 300 mg
  Filled 2021-02-01 (×2): qty 20
  Filled 2021-02-01 (×2): qty 15
  Filled 2021-02-01: qty 20
  Filled 2021-02-01: qty 10
  Filled 2021-02-01: qty 15
  Filled 2021-02-01: qty 10
  Filled 2021-02-01 (×2): qty 20

## 2021-02-01 MED ORDER — ALUM & MAG HYDROXIDE-SIMETH 200-200-20 MG/5ML PO SUSP
30.0000 mL | ORAL | Status: DC | PRN
  Administered 2021-02-01 – 2021-02-07 (×4): 30 mL via ORAL
  Filled 2021-02-01 (×5): qty 30

## 2021-02-01 MED ORDER — HEPARIN SOD (PORK) LOCK FLUSH 100 UNIT/ML IV SOLN
500.0000 [IU] | Freq: Once | INTRAVENOUS | Status: DC
  Filled 2021-02-01: qty 5

## 2021-02-01 NOTE — Consult Note (Signed)
[place]  [phone] Fax:([phone]) [place]  Reason for Referral: Patient on chemoradiation admitted with nausea.  No chief complaint on file.  HPI:  This is a very pleasant 50 yr old male with SCC of tonsil, HPV positive, on concurrent chemoradiation admitted with nausea and vomiting. Patient complains of increased secretions making him gag and throw up and couldn't maintain his nourishment, hence came to the ED. Since he came to the ED, he continues to struggle with increased secretions, and continues to have periodic nausea and vomiting. He feels very tired and weak today, He is on continous G tube feeds now. He says scopolamine hasn't done much about the secretions yet. Pain in the throat is also very bothersome and he is on morphine and oxycodone PRN No fevers or chills No diarrhea. No change in urinary habits  Rest of the pertinent 10 point ROS reviewed and negative.  Past Medical History:  Diagnosis Date   Cancer [facility])    Sleep apnea    Tonsillar mass 11/16/2020  :   Past Surgical History:  Procedure Laterality Date   DIRECT LARYNGOSCOPY N/A 11/22/2020   Procedure: DIRECT LARYNGOSCOPY WITH BIOPSY;  Surgeon: Leta Baptist, MD;  Location: [place];  Service: ENT;  Laterality: N/A;   FOOT SURGERY  2000   extra nivicular removal   IR GASTROSTOMY TUBE MOD SED  12/24/2020   IR IMAGING GUIDED PORT INSERTION  12/24/2020   SPINE SURGERY     TONSILLECTOMY Left 11/22/2020   Procedure: LEFT TONSILLECTOMY;  Surgeon: Leta Baptist, MD;  Location: [place];  Service: ENT;  Laterality: Left;  :   Current Facility-Administered Medications  Medication Dose Route Frequency Provider Last Rate Last Admin   acetaminophen (TYLENOL) tablet 650 mg  650 mg Oral Q6H PRN Howerter, Justin B, DO       Or   acetaminophen (TYLENOL) suppository 650 mg  650 mg Rectal Q6H PRN Howerter, Justin B, DO        alum & mag hydroxide-simeth (MAALOX/MYLANTA) 200-200-20 MG/5ML suspension 30 mL  30 mL Oral Q4H PRN Mariel Aloe, MD   30 mL at 02/01/21 0330   Chlorhexidine Gluconate Cloth 2 % PADS 6 each  6 each Topical Daily Howerter, Justin B, DO   6 each at 02/01/21 1054   feeding supplement (OSMOLITE 1.5 CAL) liquid 1,000 mL  1,000 mL Per Tube Continuous Mariel Aloe, MD 20 mL/hr at 02/01/21 0640 Rate Change at 02/01/21 0640   feeding supplement (PROSource TF) liquid 45 mL  45 mL Per Tube BID Mariel Aloe, MD       free water 30 mL  30 mL Per Tube Q4H Mariel Aloe, MD   30 mL at 02/01/21 1630   guaiFENesin (ROBITUSSIN) 100 MG/5ML solution 300 mg  15 mL Per Tube Q6H Mariel Aloe, MD   300 mg at 02/01/21 1630   Followed by   Derrill Memo ON 02/03/2021] guaiFENesin (ROBITUSSIN) 100 MG/5ML solution 300 mg  15 mL Per Tube Q6H PRN Mariel Aloe, MD       lactated ringers infusion   Intravenous Continuous Kathryne Eriksson, NP 50 mL/hr at 02/01/21 0659 Infusion Verify at 02/01/21 0659   lidocaine (XYLOCAINE) 2 % viscous mouth solution 15 mL  15 mL Mouth/Throat Q4H PRN Mariel Aloe, MD   15 mL at 02/01/21 1738   loratadine (CLARITIN) tablet 10 mg  10  mg Per Tube Daily Mariel Aloe, MD   10 mg at 02/01/21 1054   LORazepam (ATIVAN) injection 0.5 mg  0.5 mg Intravenous Q6H PRN Howerter, Justin B, DO   0.5 mg at 02/01/21 2000   metoCLOPramide (REGLAN) 5 MG/5ML solution 5 mg  5 mg Per Tube QID Mariel Aloe, MD   5 mg at 02/01/21 1732   morphine 4 MG/ML injection 4 mg  4 mg Intravenous Q3H PRN Mariel Aloe, MD   4 mg at 02/01/21 1733   naloxone [facility]) injection 0.4 mg  0.4 mg Intravenous PRN Howerter, Justin B, DO       ondansetron (ZOFRAN) injection 4 mg  4 mg Intravenous Q6H PRN Howerter, Justin B, DO   4 mg at 02/01/21 0945   oxyCODONE-acetaminophen (PERCOCET) 7.5-325 MG per tablet 1 tablet  1 tablet Oral Q6H PRN Mariel Aloe, MD   1 tablet at 02/01/21 2000   pantoprazole (PROTONIX) injection  40 mg  40 mg Intravenous Q24H Howerter, Justin B, DO   40 mg at 02/01/21 0828   scopolamine (TRANSDERM-SCOP) 1 MG/3DAYS 1.5 mg  1 patch Transdermal Q72H Mariel Aloe, MD   1.5 mg at 01/31/21 1056   sodium chloride flush (NS) 0.9 % injection 10-40 mL  10-40 mL Intracatheter Q12H Howerter, Justin B, DO   10 mL at 01/30/21 2124   sodium chloride flush (NS) 0.9 % injection 10-40 mL  10-40 mL Intracatheter PRN Howerter, Justin B, DO       sodium chloride HYPERTONIC 3 % nebulizer solution 4 mL  4 mL Nebulization Once Mariel Aloe, MD       Facility-Administered Medications Ordered in Other Encounters  Medication Dose Route Frequency Provider Last Rate Last Admin   heparin lock flush 100 unit/mL  500 Units Intracatheter Once PRN Ruba Outen, MD       sodium chloride flush (NS) 0.9 % injection 10 mL  10 mL Intracatheter Once PRN Benay Pike, MD         No Known Allergies:  History reviewed. No pertinent family history.:   Social History   Socioeconomic History   Marital status: Married    Spouse name: Not on file   Number of children: Not on file   Years of education: Not on file   Highest education level: Not on file  Occupational History   Not on file  Tobacco Use   Smoking status: Never   Smokeless tobacco: Never  Vaping Use   Vaping Use: Never used  Substance and Sexual Activity   Alcohol use: Not Currently    Comment: rare   Drug use: Never   Sexual activity: Not Currently  Other Topics Concern   Not on file  Social History Narrative   Not on file   Social Determinants of Health   Financial Resource Strain: Low Risk    Difficulty of Paying Living Expenses: Not hard at all  Food Insecurity: No Food Insecurity   Worried About Charity fundraiser in the Last Year: Never true   [place] in the Last Year: Never true  Transportation Needs: No Transportation Needs   Lack of Transportation (Medical): No   Lack of Transportation (Non-Medical): No   Physical Activity: Not on file  Stress: No Stress Concern Present   Feeling of Stress : Only a little  Social Connections: Engineer, building services of Communication with Friends and Family: More than three times a week  Frequency of Social Gatherings with Friends and Family: Three times a week   Attends Religious Services: 1 to 4 times per year   Active Member of Clubs or Organizations: Yes   Attends Archivist Meetings: 1 to 4 times per year   Marital Status: Married  Human resources officer Violence: Not At Risk   Fear of Current or Ex-Partner: No   Emotionally Abused: No   Physically Abused: No   Sexually Abused: No  :  Exam: Patient Vitals for the past 24 hrs:  BP Temp Temp src Pulse Resp SpO2 Weight  02/01/21 1654 99/75 98.7 F (37.1 C) Oral 91 18 100 % --  02/01/21 1154 90/66 99.1 F (37.3 C) Oral 90 19 97 % --  02/01/21 0831 113/81 99.1 F (37.3 C) Oral (!) 102 19 99 % --  02/01/21 0528 (!) 102/59 99.3 F (37.4 C) Oral 89 18 94 % 226 lb 6.4 oz (102.7 kg)  02/01/21 0008 94/62 99.1 F (37.3 C) Oral 100 18 97 % --  01/31/21 2134 90/67 98.4 F (36.9 C) Oral 76 17 95 % --    General:  appears to be in no acute distress, was having an episode of vomiting which is basically spitting up mucus when I went to see him He otherwise was appearing weak   Lab Results  Component Value Date   WBC 1.3 (LL) 02/01/2021   HGB 7.2 (L) 02/01/2021   HCT 20.2 (L) 02/01/2021   PLT 106 (L) 02/01/2021   GLUCOSE 94 01/30/2021   ALT 38 01/30/2021   AST 21 01/30/2021   NA 136 01/30/2021   K 3.9 01/30/2021   CL 103 01/30/2021   CREATININE 1.04 01/30/2021   BUN 23 (H) 01/30/2021   CO2 29 01/30/2021    DG Chest Port 1 View  Result Date: 01/29/2021 CLINICAL DATA:  Dyspnea EXAM: PORTABLE CHEST 1 VIEW COMPARISON:  November 11, 2020 FINDINGS: Right chest wall Port-A-Cath with tip overlying the superior cavoatrial junction. The heart size and mediastinal contours are within normal  limits. Both lungs are clear. The visualized skeletal structures are unremarkable. IMPRESSION: No acute abnormality of the lungs in AP portable projection. Electronically Signed   By: Dahlia Bailiff MD   On: 01/29/2021 17:09    DG Chest Port 1 View  Result Date: 01/29/2021 CLINICAL DATA:  Dyspnea EXAM: PORTABLE CHEST 1 VIEW COMPARISON:  November 11, 2020 FINDINGS: Right chest wall Port-A-Cath with tip overlying the superior cavoatrial junction. The heart size and mediastinal contours are within normal limits. Both lungs are clear. The visualized skeletal structures are unremarkable. IMPRESSION: No acute abnormality of the lungs in AP portable projection. Electronically Signed   By: Dahlia Bailiff MD   On: 01/29/2021 17:09    Assessment and Plan:   This is a very pleasant 50 yr old male patient with SCC tonsil, HPV positive on chemoradiation admitted with intractable nausea and vomiting. He says the secretions have been the most bothersome and make him gag. He received a dose of robinul which worked best for him so far, scopolamine hasnt been very effective in reducing secretions. I have recommended we continue scopolamine, IV nausea medication and pain management and G tube feeding for now. Will defer chemo this week given his symptoms and ongoing neutropenia. Can try robinul for inpatient use if scopolamine if not helpful, but wouldn't recommend outpatient use. 2. Leukopenia  from chemotherapy, no evidence of infection,  No role for growth factors, can continue to monitor  for now. 3. Anemia from recent chemotherapy use Please consider 1 unit of PRBC if hb less than 7      The length of time of the face-to-face encounter was 50 minutes. More than 50% of time was spent counseling and coordination of care.     Thank you for this referral.

## 2021-02-01 NOTE — Progress Notes (Signed)
Patient transferred to [facility] via [place]. Given PRN Percocet and Ativan prior transfer. All belongings given to patient. Report given to WL(6E).

## 2021-02-01 NOTE — Progress Notes (Signed)
PROGRESS NOTE    Gerald [name]  JOI:[id number] DOB: [birth year] DOA: 01/29/2021 PCP: Pcp, No   Brief Narrative: Gerald [name] is a 50 y.o. 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 Patient presented secondary to intractable nausea and vomiting. Started on IV fluids and IV antiemetics.   Assessment & Plan:   Principal Problem:   Intractable nausea and vomiting Active Problems:   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HCC)   Dehydration   Pancytopenia (HCC)   GERD (gastroesophageal reflux disease)   Intractable nausea and vomiting Patient with multiple episodes of emesis refractory to outpatient Zofran/compazine. He was started on IV fluids inpatient in addition to IV antiemetics. He believes most of his vomiting is related to secretions, but he also was unable to tolerate tube feeds. He is currently receiving chemotherapy treatment with last treatment on 7/6. Given a dose of Robinul with some improvement. Patient declining higher rates of fluid as he states this is contributing to his excessive secretions. -Continue IV fluids -Continue Zofran IV prn in addition to Ativan IV prn for nausea -Scopolamine patch -Lidocaine for throat pain -Mucinex -Hypertonic saline nebulizer x1  Dehydration Secondary to above. Patient started on IV fluids.  Pancytopenia Likely secondary to chemotherapy. Associated ANC of 900 today which is stable. Afebrile. -Daily CBC with differential -Neutropenic precautions  Tonsillar squamous cell carcinoma Patient follows with Dr. Chryl Heck as an outpatient and is currently receiving chemotherapy for treatment. Currently receiving radiation therapy. Patient has a gastrostomy tube in place secondary to cancer/radiation history. -Transfer to [facility] for radiation therapy  Tube feeds Patient is on chronic tube feeds. Dietitian consulted and have recommended continuous tube feed. Continues feeds started 7/10 with  plan to up-titrate rate. Goal rate of 70 ml/hr. Recommendation to start Reglan -Start Reglan 5 mg QID  GERD -Continue Protonix  Obesity Body mass index is 32.49 kg/m.  DVT prophylaxis: SCDs Code Status:   Code Status: Full Code Family Communication: None at bedside Disposition Plan: Discharge home likely in 2-4 days pending ability to tolerate oral intake   Consultants:  Medical oncology  Procedures:  None  Antimicrobials: None    Subjective: Continues to have increased secretions. Thinks some of his secretions are coming from his chest and coughing them up.  Objective: Vitals:   02/01/21 0008 02/01/21 0528 02/01/21 0831 02/01/21 1154  BP: 94/62 (!) 102/59 113/81 90/66  Pulse: 100 89 (!) 102 90  Resp: 18 18 19 19   Temp: 99.1 F (37.3 C) 99.3 F (37.4 C) 99.1 F (37.3 C) 99.1 F (37.3 C)  TempSrc: Oral Oral Oral Oral  SpO2: 97% 94% 99% 97%  Weight:  102.7 kg      Intake/Output Summary (Last 24 hours) at 02/01/2021 1536 Last data filed at 02/01/2021 0659 Gross per 24 hour  Intake 2578.77 ml  Output 500 ml  Net 2078.77 ml    Filed Weights   01/31/21 0508 02/01/21 0528  Weight: 105.9 kg 102.7 kg    Examination:  General exam: Appears calm and comfortable Respiratory system: Clear to auscultation. Respiratory effort normal. Cardiovascular system: S1 & S2 heard, RRR. No murmurs, rubs, gallops or clicks. Gastrointestinal system: Abdomen is nondistended, soft and nontender. No organomegaly or masses felt. Normal bowel sounds heard. Central nervous system: Alert and oriented. No focal neurological deficits. Musculoskeletal: No edema. No calf tenderness Skin: No cyanosis. No rashes Psychiatry: Judgement and insight appear normal. Mood & affect appropriate.     Data Reviewed:  I have personally reviewed following labs and imaging studies  CBC Lab Results  Component Value Date   WBC 1.3 (LL) 02/01/2021   RBC 2.15 (L) 02/01/2021   HGB 7.2 (L) 02/01/2021    HCT 20.2 (L) 02/01/2021   MCV 94.0 02/01/2021   MCH 33.5 02/01/2021   PLT 106 (L) 02/01/2021   MCHC 35.6 02/01/2021   RDW 12.8 02/01/2021   LYMPHSABS 0.2 (L) 02/01/2021   MONOABS 0.1 02/01/2021   EOSABS 0.0 02/01/2021   BASOSABS 0.0 24/23/5361     Last metabolic panel Lab Results  Component Value Date   NA 136 01/30/2021   K 3.9 01/30/2021   CL 103 01/30/2021   CO2 29 01/30/2021   BUN 23 (H) 01/30/2021   CREATININE 1.04 01/30/2021   GLUCOSE 94 01/30/2021   GFRNONAA >60 01/30/2021   CALCIUM 8.6 (L) 01/30/2021   PROT 5.1 (L) 01/30/2021   ALBUMIN 3.0 (L) 01/30/2021   BILITOT 1.3 (H) 01/30/2021   ALKPHOS 47 01/30/2021   AST 21 01/30/2021   ALT 38 01/30/2021   ANIONGAP 4 (L) 01/30/2021    CBG (last 3)  No results for input(s): GLUCAP in the last 72 hours.   GFR: Estimated Creatinine Clearance: 104.3 mL/min (by C-G formula based on SCr of 1.04 mg/dL).  Coagulation Profile: Recent Labs  Lab 01/29/21 1650 01/30/21 0353  INR 1.1 1.1     Recent Results (from the past 240 hour(s))  Resp Panel by RT-PCR (Flu A&B, Covid) Nasopharyngeal Swab     Status: None   Collection Time: 01/29/21  6:26 PM   Specimen: Nasopharyngeal Swab; Nasopharyngeal(NP) swabs in vial transport medium  Result Value Ref Range Status   SARS Coronavirus 2 by RT PCR NEGATIVE NEGATIVE Final    Comment: (NOTE) SARS-CoV-2 target nucleic acids are NOT DETECTED.  The SARS-CoV-2 RNA is generally detectable in upper respiratory specimens during the acute phase of infection. The lowest concentration of SARS-CoV-2 viral copies this assay can detect is 138 copies/mL. A negative result does not preclude SARS-Cov-2 infection and should not be used as the sole basis for treatment or other patient management decisions. A negative result may occur with  improper specimen collection/handling, submission of specimen other than nasopharyngeal swab, presence of viral mutation(s) within the areas targeted by  this assay, and inadequate number of viral copies(<138 copies/mL). A negative result must be combined with clinical observations, patient history, and epidemiological information. The expected result is Negative.  Fact Sheet for Patients:  EntrepreneurPulse.com.au  Fact Sheet for Healthcare Providers:  IncredibleEmployment.be  This test is no t yet approved or cleared by the [place] FDA and  has been authorized for detection and/or diagnosis of SARS-CoV-2 by FDA under an Emergency Use Authorization (EUA). This EUA will remain  in effect (meaning this test can be used) for the duration of the COVID-19 declaration under Section 564(b)(1) of the Act, 21 U.S.C.section 360bbb-3(b)(1), unless the authorization is terminated  or revoked sooner.       Influenza A by PCR NEGATIVE NEGATIVE Final   Influenza B by PCR NEGATIVE NEGATIVE Final    Comment: (NOTE) The Xpert Xpress SARS-CoV-2/FLU/RSV plus assay is intended as an aid in the diagnosis of influenza from Nasopharyngeal swab specimens and should not be used as a sole basis for treatment. Nasal washings and aspirates are unacceptable for Xpert Xpress SARS-CoV-2/FLU/RSV testing.  Fact Sheet for Patients: EntrepreneurPulse.com.au  Fact Sheet for Healthcare Providers: IncredibleEmployment.be  This test is not yet approved or cleared  by the [place] and has been authorized for detection and/or diagnosis of SARS-CoV-2 by FDA under an Emergency Use Authorization (EUA). This EUA will remain in effect (meaning this test can be used) for the duration of the COVID-19 declaration under Section 564(b)(1) of the Act, 21 U.S.C. section 360bbb-3(b)(1), unless the authorization is terminated or revoked.  Performed at [facility], [place] [address] [place], [place] [address]          Radiology Studies: No results found.      Scheduled  Meds:  Chlorhexidine Gluconate Cloth  6 each Topical Daily   feeding supplement (PROSource TF)  45 mL Per Tube BID   free water  30 mL Per Tube Q4H   loratadine  10 mg Per Tube Daily   pantoprazole (PROTONIX) IV  40 mg Intravenous Q24H   scopolamine  1 patch Transdermal Q72H   sodium chloride flush  10-40 mL Intracatheter Q12H   Continuous Infusions:  feeding supplement (OSMOLITE 1.5 CAL) 20 mL/hr at 02/01/21 0640   lactated ringers 50 mL/hr at 02/01/21 0659     LOS: 2 days     Cordelia Poche, MD [facility] 02/01/2021, 3:36 PM  If 7PM-7AM, please contact night-coverage [email or web address]

## 2021-02-01 NOTE — Progress Notes (Signed)
Patient's G-tube noted to be clamped. When patient was asked, He stated he was nauseous.TF back down to 24ml/hr. Clarene Essex, NP was notified.  Call bell within reach and will continue to monitor.

## 2021-02-01 NOTE — Progress Notes (Signed)
Pt scheduled for transfer to [facility] long this evening. Pt is to have his radiation treatment tomorrow. [facility] called and waiting for update on pickup time

## 2021-02-02 ENCOUNTER — Ambulatory Visit: Payer: Managed Care, Other (non HMO)

## 2021-02-02 ENCOUNTER — Ambulatory Visit
Admission: RE | Admit: 2021-02-02 | Discharge: 2021-02-02 | Disposition: A | Discharge status: Home / Self Care | Payer: Managed Care, Other (non HMO) | Source: Ambulatory Visit | Attending: Radiation Oncology | Admitting: Radiation Oncology

## 2021-02-02 LAB — CBC WITH DIFFERENTIAL/PLATELET
Abs Immature Granulocytes: 0.01 10*3/uL (ref 0.00–0.07)
Basophils Absolute: 0 10*3/uL (ref 0.0–0.1)
Basophils Relative: 1 %
Eosinophils Absolute: 0 10*3/uL (ref 0.0–0.5)
Eosinophils Relative: 1 %
HCT: 17.3 % — ABNORMAL LOW (ref 39.0–52.0)
Hemoglobin: 6.3 g/dL — CL (ref 13.0–17.0)
Immature Granulocytes: 1 %
Lymphocytes Relative: 23 %
Lymphs Abs: 0.3 10*3/uL — ABNORMAL LOW (ref 0.7–4.0)
MCH: 34.1 pg — ABNORMAL HIGH (ref 26.0–34.0)
MCHC: 36.4 g/dL — ABNORMAL HIGH (ref 30.0–36.0)
MCV: 93.5 fL (ref 80.0–100.0)
Monocytes Absolute: 0.1 10*3/uL (ref 0.1–1.0)
Monocytes Relative: 7 %
Neutro Abs: 1 10*3/uL — ABNORMAL LOW (ref 1.7–7.7)
Neutrophils Relative %: 67 %
Platelets: 92 10*3/uL — ABNORMAL LOW (ref 150–400)
RBC: 1.85 MIL/uL — ABNORMAL LOW (ref 4.22–5.81)
RDW: 13.4 % (ref 11.5–15.5)
WBC: 1.5 10*3/uL — ABNORMAL LOW (ref 4.0–10.5)
nRBC: 0 % (ref 0.0–0.2)

## 2021-02-02 LAB — PREPARE RBC (CROSSMATCH)

## 2021-02-02 MED ORDER — OSMOLITE 1.5 CAL PO LIQD
1000.0000 mL | ORAL | Status: DC
  Administered 2021-02-02 – 2021-02-03 (×2): 1000 mL
  Filled 2021-02-02 (×2): qty 1000

## 2021-02-02 MED ORDER — BISACODYL 5 MG PO TBEC
10.0000 mg | DELAYED_RELEASE_TABLET | Freq: Once | ORAL | Status: AC
  Administered 2021-02-02: 10 mg via ORAL
  Filled 2021-02-02: qty 2

## 2021-02-02 MED ORDER — ONDANSETRON HCL 4 MG PO TABS
4.0000 mg | ORAL_TABLET | Freq: Once | ORAL | Status: AC
  Administered 2021-02-02: 4 mg via ORAL
  Filled 2021-02-02: qty 1

## 2021-02-02 MED ORDER — SODIUM CHLORIDE 0.9% IV SOLUTION: Freq: Once | INTRAVENOUS | Status: AC

## 2021-02-02 MED ORDER — GLYCOPYRROLATE 1 MG PO TABS
1.0000 mg | ORAL_TABLET | Freq: Two times a day (BID) | ORAL | Status: DC
  Administered 2021-02-02 – 2021-02-08 (×13): 1 mg via ORAL
  Filled 2021-02-02 (×13): qty 1

## 2021-02-02 MED ORDER — PROSOURCE TF PO LIQD
90.0000 mL | Freq: Three times a day (TID) | ORAL | Status: DC
  Administered 2021-02-02 – 2021-02-05 (×9): 90 mL
  Filled 2021-02-02 (×12): qty 90

## 2021-02-02 MED ORDER — FENTANYL 12 MCG/HR TD PT72
1.0000 | MEDICATED_PATCH | TRANSDERMAL | Status: DC
  Administered 2021-02-02 – 2021-02-05 (×2): 1 via TRANSDERMAL
  Filled 2021-02-02 (×2): qty 1

## 2021-02-02 NOTE — Progress Notes (Addendum)
Nutrition Follow-up  DOCUMENTATION CODES:   Obesity unspecified  INTERVENTION:   Monitor magnesium, potassium, and phosphorus daily for at least 3 days, MD to replete as needed, as pt is at risk for refeeding syndrome.  -Resume Osmolite 1.5 @ 30 ml/hr via G-tube (Pt requesting after blood transfusion)  -Increase Prosource TF 90 ml TID, each provides 40 kcals and 11g protein  -Provides 1320 kcals (55% of needs), 111g protein and 548 ml H2O  -If patient doesn't have a BM, will need to consider bowel regimen  -Will continue to monitor for tolerance of TF regimen.  TF goal: Osmolite 1.5@70ml /hr- Initiate at 71ml/hr; once tolerating, advance by 31ml/hr q 12 hours until goal rate is reached.   -Pro-Source 65ml BID via tube, provides 40kcal and 11g of protein per serving  NUTRITION DIAGNOSIS:   Inadequate oral intake related to cancer and cancer related treatments as evidenced by other (comment) (pt with G-tube).  Ongoing.  GOAL:   Patient will meet greater than or equal to 90% of their needs  Not meeting.  MONITOR:   PO intake, Labs, Weight trends, TF tolerance, Skin, I & O's  ASSESSMENT:   50 y.o. 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  Patient followed by [facility] RD, last seen 7/6. At that time, pt stated he was tolerating bolus feeds of Osmolite 1.5 (5-7 cartons via PEG). Developed N/V following that appointment d/t reported increase in secretions. Pt believes this has caused his nausea primarily. Has not been allowing staff to advance TF and stops if he wants to.  States he will continue to do this here but is willing to try TF again following blood transfusion.   During visit with pt, very calm and pleasant. States he feels better today than he has but continues to  suction out secretions frequently. TF off. At the end of the visit, pt began to profusely vomit. Alerted RN who plans to give him some more nausea medications. May need to consider scheduled Zofran. Receiving Reglan now which began yesterday.   Pt also has not had a BM for a few days. States he feels like he may have one today. If he doesn't, will need to consider bowel regimen.  If pt is unable to tolerate TF, may need to start TPN.  Medications: Lactated ringers, IV Zofran  Labs reviewed.  NUTRITION - FOCUSED PHYSICAL EXAM:  Pt profusely vomiting during visit.  Diet Order:   Diet Order             Diet clear liquid Room service appropriate? Yes; Fluid consistency: Thin  Diet effective now                   EDUCATION NEEDS:   No education needs have been identified at this time  Skin:  Skin Assessment: Reviewed RN Assessment  Last BM:  7/9  Height:   Ht Readings from Last 1 Encounters:  02/01/21 5\' 10"  (1.778 m)    Weight:   Wt Readings from Last 1 Encounters:  02/02/21 103.4 kg    Ideal Body Weight:  75.45 kg  BMI:  Body mass index is 32.71 kg/m.  Estimated Nutritional Needs:   Kcal:  2400-2700kcal/day  Protein:  >120g/day  Fluid:  2.3-2.6L/day   Clayton Bibles, MS, RD, LDN Inpatient Clinical Dietitian Contact information available via Amion

## 2021-02-02 NOTE — Progress Notes (Addendum)
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  Assessment & Plan:   Principal Problem:   Intractable nausea and vomiting Active Problems:   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HCC)   Dehydration   Pancytopenia (HCC)   GERD (gastroesophageal reflux disease)    #1 intractable nausea and vomiting refractory to outpatient Zofran and Compazine.  He also has a scopolamine patch which he thinks is not helping.  His main concern is he has stomach secretions and he is requesting the secretions be dried out which would help him not to choke.  Robinul has been ordered.  Continue scopolamine patch and as needed Zofran.  #2 pancytopenia improving likely secondary to recent chemo.  ANC improving `1000 from 900.  #3 tonsillar squamous cell carcinoma followed by Dr. Dorcas Carrow patient has a PEG tube in place and is receiving nutrition through it.  #4 GERD continue Protonix  #5 anemia hemoglobin dropped to 6.3 will transfuse 1 unit of packed RBC.      Nutrition Problem: Inadequate oral intake Etiology: cancer and cancer related treatments     Signs/Symptoms: other (comment) (pt with G-tube)   Estimated body mass index is 32.71 kg/m as calculated from the following:   Height as of this encounter: 5\' 10"  (1.778 m).   Weight as of this encounter: 103.4 kg.  DVT prophylaxis: SCD  code Status: Full code  family Communication: None at bedside  disposition Plan:  Status is: Inpatient    Dispo: The patient is from: Home              Anticipated d/c is to: Home              Patient currently is not medically stable to d/c.   Difficult to place patient No       Consultants:  Oncology  Procedures: None Antimicrobials: None Subjective: Is resting in bed  continuously using the suction to remove secretions from his throat.  No other complaints.  Objective: Vitals:   02/02/21 0910 02/02/21 1109 02/02/21 1131 02/02/21 1425  BP: (!) 99/58 102/65 95/70 104/60  Pulse: 74 80 96 98  Resp: 17 17 16 16   Temp: 98.8 F (37.1 C) 98.8 F (37.1 C) 99 F (37.2 C) 98.9 F (37.2 C)  TempSrc: Oral Oral  Oral  SpO2: 95% 98%    Weight:      Height:        Intake/Output Summary (Last 24 hours) at 02/02/2021 1553 Last data filed at 02/02/2021 1405 Gross per 24 hour  Intake 1295 ml  Output 300 ml  Net 995 ml   Filed Weights   02/01/21 0528 02/01/21 2042 02/02/21 0404  Weight: 102.7 kg 103.4 kg 103.4 kg    Examination:  General exam: Appears calm and comfortable  Respiratory system: Clear to auscultation. Respiratory effort normal. Cardiovascular system: S1 & S2 heard, RRR. No JVD, murmurs, rubs, gallops or clicks. No pedal edema. Gastrointestinal system: Abdomen is nondistended, soft and nontender. No organomegaly or masses felt. Normal bowel sounds heard. Central nervous system: Alert and oriented. No focal neurological deficits. Extremities: Symmetric 5 x 5 power. Skin: No rashes, lesions or ulcers Psychiatry: Judgement and insight appear normal. Mood & affect appropriate.     Data Reviewed: I have personally reviewed following labs and imaging studies  CBC:  Recent Labs  Lab 01/29/21 1650 01/29/21 2140 01/30/21 0353 01/30/21 2106 01/31/21 0330 02/01/21 0355 02/02/21 0512  WBC 3.1*  --  2.0*  --  1.3* 1.3* 1.5*  NEUTROABS 2.6  --  1.4*  --  0.9* 0.9* 1.0*  HGB 8.2*   < > 7.0* 7.1* 7.0* 7.2* 6.3*  HCT 23.5*   < > 19.7* 20.1* 19.7* 20.2* 17.3*  MCV 95.1  --  95.2  --  93.4 94.0 93.5  PLT 120*  --  90*  --  96* 106* 92*   < > = values in this interval not displayed.   Basic Metabolic Panel: Recent Labs  Lab 01/29/21 1650 01/29/21 2140 01/30/21 0353  NA 136  --  136  K 3.9  --  3.9  CL 102  --  103  CO2 26  --  29   GLUCOSE 123*  --  94  BUN 27*  --  23*  CREATININE 1.07  --  1.04  CALCIUM 8.7*  --  8.6*  MG  --  1.9 2.0   GFR: Estimated Creatinine Clearance: 104.7 mL/min (by C-G formula based on SCr of 1.04 mg/dL). Liver Function Tests: Recent Labs  Lab 01/29/21 1650 01/30/21 0353  AST 25 21  ALT 39 38  ALKPHOS 53 47  BILITOT 1.5* 1.3*  PROT 5.8* 5.1*  ALBUMIN 3.5 3.0*   Recent Labs  Lab 01/29/21 1650  LIPASE 25   No results for input(s): AMMONIA in the last 168 hours. Coagulation Profile: Recent Labs  Lab 01/29/21 1650 01/30/21 0353  INR 1.1 1.1   Cardiac Enzymes: No results for input(s): CKTOTAL, CKMB, CKMBINDEX, TROPONINI in the last 168 hours. BNP (last 3 results) No results for input(s): PROBNP in the last 8760 hours. HbA1C: No results for input(s): HGBA1C in the last 72 hours. CBG: No results for input(s): GLUCAP in the last 168 hours. Lipid Profile: No results for input(s): CHOL, HDL, LDLCALC, TRIG, CHOLHDL, LDLDIRECT in the last 72 hours. Thyroid Function Tests: No results for input(s): TSH, T4TOTAL, FREET4, T3FREE, THYROIDAB in the last 72 hours. Anemia Panel: No results for input(s): VITAMINB12, FOLATE, FERRITIN, TIBC, IRON, RETICCTPCT in the last 72 hours. Sepsis Labs: Recent Labs  Lab 01/29/21 1650  LATICACIDVEN 1.7    Recent Results (from the past 240 hour(s))  Resp Panel by RT-PCR (Flu A&B, Covid) Nasopharyngeal Swab     Status: None   Collection Time: 01/29/21  6:26 PM   Specimen: Nasopharyngeal Swab; Nasopharyngeal(NP) swabs in vial transport medium  Result Value Ref Range Status   SARS Coronavirus 2 by RT PCR NEGATIVE NEGATIVE Final    Comment: (NOTE) SARS-CoV-2 target nucleic acids are NOT DETECTED.  The SARS-CoV-2 RNA is generally detectable in upper respiratory specimens during the acute phase of infection. The lowest concentration of SARS-CoV-2 viral copies this assay can detect is 138 copies/mL. A negative result does not preclude  SARS-Cov-2 infection and should not be used as the sole basis for treatment or other patient management decisions. A negative result may occur with  improper specimen collection/handling, submission of specimen other than nasopharyngeal swab, presence of viral mutation(s) within the areas targeted by this assay, and inadequate number of viral copies(<138 copies/mL). A negative result must be combined with clinical observations, patient history, and epidemiological information. The expected result is Negative.  Fact Sheet for Patients:  EntrepreneurPulse.com.au  Fact Sheet for Healthcare Providers:  IncredibleEmployment.be  This test is no t yet approved or cleared by the [place]  States FDA and  has been authorized for detection and/or diagnosis of SARS-CoV-2 by FDA under an Emergency Use Authorization (EUA). This EUA will remain  in effect (meaning this test can be used) for the duration of the COVID-19 declaration under Section 564(b)(1) of the Act, 21 U.S.C.section 360bbb-3(b)(1), unless the authorization is terminated  or revoked sooner.       Influenza A by PCR NEGATIVE NEGATIVE Final   Influenza B by PCR NEGATIVE NEGATIVE Final    Comment: (NOTE) The Xpert Xpress SARS-CoV-2/FLU/RSV plus assay is intended as an aid in the diagnosis of influenza from Nasopharyngeal swab specimens and should not be used as a sole basis for treatment. Nasal washings and aspirates are unacceptable for Xpert Xpress SARS-CoV-2/FLU/RSV testing.  Fact Sheet for Patients: EntrepreneurPulse.com.au  Fact Sheet for Healthcare Providers: IncredibleEmployment.be  This test is not yet approved or cleared by the [place] FDA and has been authorized for detection and/or diagnosis of SARS-CoV-2 by FDA under an Emergency Use Authorization (EUA). This EUA will remain in effect (meaning this test can be used) for the duration of  the COVID-19 declaration under Section 564(b)(1) of the Act, 21 U.S.C. section 360bbb-3(b)(1), unless the authorization is terminated or revoked.  Performed at [facility], [place] [address], [place], [place] [address]          Radiology Studies: No results found.      Scheduled Meds:  Chlorhexidine Gluconate Cloth  6 each Topical Daily   feeding supplement (PROSource TF)  90 mL Per Tube TID   fentaNYL  1 patch Transdermal Q72H   free water  30 mL Per Tube Q4H   glycopyrrolate  1 mg Oral BID   guaiFENesin  15 mL Per Tube Q6H   heparin lock flush  500 Units Intravenous Once   loratadine  10 mg Per Tube Daily   metoCLOPramide  5 mg Per Tube QID   pantoprazole (PROTONIX) IV  40 mg Intravenous Q24H   scopolamine  1 patch Transdermal Q72H   sodium chloride flush  10-40 mL Intracatheter Q12H   sodium chloride HYPERTONIC  4 mL Nebulization Once   Continuous Infusions:  feeding supplement (OSMOLITE 1.5 CAL) 1,000 mL (02/02/21 1542)   lactated ringers 50 mL/hr at 02/01/21 2207     LOS: 3 days    Time spent:    Georgette Shell, MD 02/02/2021, 3:53 PM

## 2021-02-03 ENCOUNTER — Ambulatory Visit
Admission: RE | Admit: 2021-02-03 | Discharge: 2021-02-03 | Disposition: A | Discharge status: Home / Self Care | Payer: Managed Care, Other (non HMO) | Source: Ambulatory Visit | Attending: Radiation Oncology | Admitting: Radiation Oncology

## 2021-02-03 DIAGNOSIS — R112 Nausea with vomiting, unspecified: Secondary | ICD-10-CM | POA: Diagnosis not present

## 2021-02-03 LAB — CBC
HCT: 21.8 % — ABNORMAL LOW (ref 39.0–52.0)
Hemoglobin: 7.6 g/dL — ABNORMAL LOW (ref 13.0–17.0)
MCH: 31.9 pg (ref 26.0–34.0)
MCHC: 34.9 g/dL (ref 30.0–36.0)
MCV: 91.6 fL (ref 80.0–100.0)
Platelets: 93 10*3/uL — ABNORMAL LOW (ref 150–400)
RBC: 2.38 MIL/uL — ABNORMAL LOW (ref 4.22–5.81)
RDW: 13.7 % (ref 11.5–15.5)
WBC: 2 10*3/uL — ABNORMAL LOW (ref 4.0–10.5)
nRBC: 0 % (ref 0.0–0.2)

## 2021-02-03 LAB — CBC WITH DIFFERENTIAL/PLATELET
Abs Immature Granulocytes: 0 10*3/uL (ref 0.00–0.07)
Basophils Absolute: 0 10*3/uL (ref 0.0–0.1)
Basophils Relative: 0 %
Eosinophils Absolute: 0 10*3/uL (ref 0.0–0.5)
Eosinophils Relative: 1 %
HCT: 19.6 % — ABNORMAL LOW (ref 39.0–52.0)
Hemoglobin: 7.1 g/dL — ABNORMAL LOW (ref 13.0–17.0)
Immature Granulocytes: 0 %
Lymphocytes Relative: 31 %
Lymphs Abs: 0.5 10*3/uL — ABNORMAL LOW (ref 0.7–4.0)
MCH: 33.5 pg (ref 26.0–34.0)
MCHC: 36.2 g/dL — ABNORMAL HIGH (ref 30.0–36.0)
MCV: 92.5 fL (ref 80.0–100.0)
Monocytes Absolute: 0.1 10*3/uL (ref 0.1–1.0)
Monocytes Relative: 8 %
Neutro Abs: 0.9 10*3/uL — ABNORMAL LOW (ref 1.7–7.7)
Neutrophils Relative %: 60 %
Platelets: 98 10*3/uL — ABNORMAL LOW (ref 150–400)
RBC: 2.12 MIL/uL — ABNORMAL LOW (ref 4.22–5.81)
RDW: 13.8 % (ref 11.5–15.5)
WBC: 1.5 10*3/uL — ABNORMAL LOW (ref 4.0–10.5)
nRBC: 0 % (ref 0.0–0.2)

## 2021-02-03 LAB — COMPREHENSIVE METABOLIC PANEL
ALT: 42 U/L (ref 0–44)
AST: 20 U/L (ref 15–41)
Albumin: 3.5 g/dL (ref 3.5–5.0)
Alkaline Phosphatase: 69 U/L (ref 38–126)
Anion gap: 5 (ref 5–15)
BUN: 21 mg/dL — ABNORMAL HIGH (ref 6–20)
CO2: 27 mmol/L (ref 22–32)
Calcium: 8.4 mg/dL — ABNORMAL LOW (ref 8.9–10.3)
Chloride: 102 mmol/L (ref 98–111)
Creatinine, Ser: 0.97 mg/dL (ref 0.61–1.24)
GFR, Estimated: >60 mL/min (ref >60)
Glucose, Bld: 115 mg/dL — ABNORMAL HIGH (ref 70–99)
Potassium: 3.6 mmol/L (ref 3.5–5.1)
Sodium: 134 mmol/L — ABNORMAL LOW (ref 135–145)
Total Bilirubin: 1.4 mg/dL — ABNORMAL HIGH (ref 0.3–1.2)
Total Protein: 5.8 g/dL — ABNORMAL LOW (ref 6.5–8.1)

## 2021-02-03 LAB — PREPARE RBC (CROSSMATCH)

## 2021-02-03 MED ORDER — SODIUM CHLORIDE 0.9% IV SOLUTION: Freq: Once | INTRAVENOUS | Status: AC

## 2021-02-03 MED ORDER — CHLORHEXIDINE GLUCONATE 0.12 % MT SOLN
10.0000 mL | Freq: Four times a day (QID) | OROMUCOSAL | Status: DC
  Administered 2021-02-03 – 2021-02-08 (×22): 10 mL via OROMUCOSAL
  Filled 2021-02-03 (×22): qty 15

## 2021-02-03 MED ORDER — BISACODYL 5 MG PO TBEC
10.0000 mg | DELAYED_RELEASE_TABLET | Freq: Every day | ORAL | Status: DC
  Administered 2021-02-03: 10 mg via ORAL
  Filled 2021-02-03 (×4): qty 2

## 2021-02-03 MED ORDER — SODIUM CHLORIDE 0.9 % IV SOLN: INTRAVENOUS | Status: DC

## 2021-02-03 MED ORDER — LACTULOSE 10 GM/15ML PO SOLN
30.0000 g | Freq: Once | ORAL | Status: AC
  Administered 2021-02-03: 30 g
  Filled 2021-02-03: qty 45

## 2021-02-03 NOTE — Progress Notes (Signed)
Nutrition Follow-up  DOCUMENTATION CODES:   Obesity unspecified  INTERVENTION:   Monitor magnesium, potassium, and phosphorus daily for at least 3 days, MD to replete as needed, as pt is at risk for refeeding syndrome.  -Continue to advance TF as tolerated to goal of 70 ml/hr.  -90 ml Prosource TF TID, each provides 40 kcals and 11g protein  -Free water 30 ml every 4 hours  -Will continue to monitor for tolerance of TF regimen.   TF goal: Osmolite 1.5@70ml /hr- Initiate at 56ml/hr; once tolerating, advance by 38ml/hr q 12 hours until goal rate is reached.   -Pro-Source 82ml BID via tube, provides 40kcal and 11g of protein per serving  **If patient is unable to have TF successfully advanced to goal, will need to consider TPN.  NUTRITION DIAGNOSIS:   Inadequate oral intake related to cancer and cancer related treatments as evidenced by other (comment) (pt with G-tube).  Ongoing.  GOAL:   Patient will meet greater than or equal to 90% of their needs  Progressing.  MONITOR:   PO intake, Labs, Weight trends, TF tolerance, Skin, I & O's  ASSESSMENT:   50 y.o. 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  Spoke with pt's RN who states that the pt's TF is off again for blood transfusion (pt's request). TF was running at 30 ml/hr. Plan is to advance TF to 40 ml/hr when transfusion complete. Pt continues to have secretions.  Pt was started on laxatives to help with constipation.  Admission weight: 233 lbs. Current weight: 233 lbs.  I/Os: +6.4L since admit UOP: 400 ml  Medications: Dulcolax, Lactulose, Reglan, Iv Ativan, IV Zofran  Labs reviewed:  Low Na  Diet Order:   Diet Order             Diet clear liquid Room service appropriate? Yes; Fluid consistency: Thin  Diet  effective now                   EDUCATION NEEDS:   No education needs have been identified at this time  Skin:  Skin Assessment: Reviewed RN Assessment  Last BM:  7/9  Height:   Ht Readings from Last 1 Encounters:  02/01/21 5\' 10"  (1.778 m)    Weight:   Wt Readings from Last 1 Encounters:  02/03/21 106 kg    Ideal Body Weight:  75.45 kg  BMI:  Body mass index is 33.53 kg/m.  Estimated Nutritional Needs:   Kcal:  2400-2700kcal/day  Protein:  >120g/day  Fluid:  2.3-2.6L/day  Clayton Bibles, MS, RD, LDN Inpatient Clinical Dietitian Contact information available via Amion

## 2021-02-03 NOTE — Progress Notes (Signed)
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  Assessment & Plan:   Principal Problem:   Intractable nausea and vomiting Active Problems:   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HCC)   Dehydration   Pancytopenia (HCC)   GERD (gastroesophageal reflux disease)    #1 intractable nausea and vomiting refractory to outpatient Zofran and Compazine.   Continue scopolamine patch and Robinul and Zofran.   I have added Ativan.   Chlorhexidine mouthwash and gargle, PPI.   Start him on Dulcolax and lactulose for bowel movements.    #2 pancytopenia improving likely secondary to recent chemo.  ANC still low 900.  #3 tonsillar squamous cell carcinoma followed by Dr.Iruku patient has a PEG tube in place and is receiving nutrition through it.  #4 GERD continue Protonix  #5 anemia hemoglobin 7.1. transfuse 1 unit of packed RBC. He received a transfusion on 02/02/2021.      Nutrition Problem: Inadequate oral intake Etiology: cancer and cancer related treatments     Signs/Symptoms: other (comment) (pt with G-tube)   Estimated body mass index is 33.53 kg/m as calculated from the following:   Height as of this encounter: 5\' 10"  (1.778 m).   Weight as of this encounter: 106 kg.  DVT prophylaxis: SCD  code Status: Full code  family Communication: None at bedside  disposition Plan:  Status is: Inpatient    Dispo: The patient is from: Home              Anticipated d/c is to: Home              Patient currently is not medically stable to d/c.   Difficult to place patient No       Consultants:  Oncology  Procedures: None Antimicrobials: None Subjective:  He is resting in bed he feels better than yesterday however he has not had a bowel movement in  multiple days he received a unit of blood transfusion on 02/02/2021 hemoglobin up to 7.1 from 6.3  Objective: Vitals:   02/03/21 0500 02/03/21 0515 02/03/21 1108 02/03/21 1138  BP:  101/70 98/67 95/69   Pulse:  79 85 82  Resp:  19 16 14   Temp:  98.1 F (36.7 C) 98.8 F (37.1 C) 99 F (37.2 C)  TempSrc:  Oral Oral   SpO2:  94% 95%   Weight: 106 kg     Height:        Intake/Output Summary (Last 24 hours) at 02/03/2021 1439 Last data filed at 02/03/2021 1123 Gross per 24 hour  Intake 4309.73 ml  Output 400 ml  Net 3909.73 ml    Filed Weights   02/01/21 2042 02/02/21 0404 02/03/21 0500  Weight: 103.4 kg 103.4 kg 106 kg    Examination:  General exam: Appears calm and comfortable  Respiratory system: Clear to auscultation. Respiratory effort normal. Cardiovascular system: S1 & S2 heard, RRR. No JVD, murmurs, rubs, gallops or clicks. No pedal edema. Gastrointestinal system: Abdomen is nondistended, soft and nontender. No organomegaly or masses felt. Normal bowel sounds heard. Central nervous system: Alert and oriented. No focal neurological deficits. Extremities: Symmetric 5 x 5 power. Skin: No rashes, lesions or ulcers Psychiatry: Judgement and insight appear normal. Mood & affect appropriate.     Data Reviewed: I have personally reviewed following labs and imaging studies  CBC: Recent Labs  Lab 01/30/21 0353 01/30/21 2106 01/31/21 0330 02/01/21 0355 02/02/21 0512 02/03/21 0509  WBC 2.0*  --  1.3* 1.3* 1.5* 1.5*  NEUTROABS 1.4*  --  0.9* 0.9* 1.0* 0.9*  HGB 7.0* 7.1* 7.0* 7.2* 6.3* 7.1*  HCT 19.7* 20.1* 19.7* 20.2* 17.3* 19.6*  MCV 95.2  --  93.4 94.0 93.5 92.5  PLT 90*  --  96* 106* 92* 98*    Basic Metabolic Panel: Recent Labs  Lab 01/29/21 1650 01/29/21 2140 01/30/21 0353 02/03/21 0509  NA 136  --  136 134*  K 3.9  --  3.9 3.6  CL 102  --  103 102  CO2 26  --  29 27  GLUCOSE 123*  --  94 115*  BUN 27*  --  23* 21*  CREATININE 1.07  --  1.04 0.97   CALCIUM 8.7*  --  8.6* 8.4*  MG  --  1.9 2.0  --     GFR: Estimated Creatinine Clearance: 113.6 mL/min (by C-G formula based on SCr of 0.97 mg/dL). Liver Function Tests: Recent Labs  Lab 01/29/21 1650 01/30/21 0353 02/03/21 0509  AST 25 21 20   ALT 39 38 42  ALKPHOS 53 47 69  BILITOT 1.5* 1.3* 1.4*  PROT 5.8* 5.1* 5.8*  ALBUMIN 3.5 3.0* 3.5    Recent Labs  Lab 01/29/21 1650  LIPASE 25    No results for input(s): AMMONIA in the last 168 hours. Coagulation Profile: Recent Labs  Lab 01/29/21 1650 01/30/21 0353  INR 1.1 1.1    Cardiac Enzymes: No results for input(s): CKTOTAL, CKMB, CKMBINDEX, TROPONINI in the last 168 hours. BNP (last 3 results) No results for input(s): PROBNP in the last 8760 hours. HbA1C: No results for input(s): HGBA1C in the last 72 hours. CBG: No results for input(s): GLUCAP in the last 168 hours. Lipid Profile: No results for input(s): CHOL, HDL, LDLCALC, TRIG, CHOLHDL, LDLDIRECT in the last 72 hours. Thyroid Function Tests: No results for input(s): TSH, T4TOTAL, FREET4, T3FREE, THYROIDAB in the last 72 hours. Anemia Panel: No results for input(s): VITAMINB12, FOLATE, FERRITIN, TIBC, IRON, RETICCTPCT in the last 72 hours. Sepsis Labs: Recent Labs  Lab 01/29/21 1650  LATICACIDVEN 1.7     Recent Results (from the past 240 hour(s))  Resp Panel by RT-PCR (Flu A&B, Covid) Nasopharyngeal Swab     Status: None   Collection Time: 01/29/21  6:26 PM   Specimen: Nasopharyngeal Swab; Nasopharyngeal(NP) swabs in vial transport medium  Result Value Ref Range Status   SARS Coronavirus 2 by RT PCR NEGATIVE NEGATIVE Final    Comment: (NOTE) SARS-CoV-2 target nucleic acids are NOT DETECTED.  The SARS-CoV-2 RNA is generally detectable in upper respiratory specimens during the acute phase of infection. The lowest concentration of SARS-CoV-2 viral copies this assay can detect is 138 copies/mL. A negative result does not preclude  SARS-Cov-2 infection and should not be used as the sole basis for treatment or other patient management decisions. A negative result may occur with  improper specimen collection/handling, submission of specimen other than nasopharyngeal swab, presence of viral mutation(s) within the areas targeted by this assay, and inadequate number of viral copies(<138 copies/mL). A negative result must be combined with clinical observations, patient history, and epidemiological information. The expected result is Negative.  Fact Sheet for Patients:  EntrepreneurPulse.com.au  Fact Sheet for Healthcare Providers:  IncredibleEmployment.be  This test is no t yet approved or cleared by the [place] FDA and  has  been authorized for detection and/or diagnosis of SARS-CoV-2 by FDA under an Emergency Use Authorization (EUA). This EUA will remain  in effect (meaning this test can be used) for the duration of the COVID-19 declaration under Section 564(b)(1) of the Act, 21 U.S.C.section 360bbb-3(b)(1), unless the authorization is terminated  or revoked sooner.       Influenza A by PCR NEGATIVE NEGATIVE Final   Influenza B by PCR NEGATIVE NEGATIVE Final    Comment: (NOTE) The Xpert Xpress SARS-CoV-2/FLU/RSV plus assay is intended as an aid in the diagnosis of influenza from Nasopharyngeal swab specimens and should not be used as a sole basis for treatment. Nasal washings and aspirates are unacceptable for Xpert Xpress SARS-CoV-2/FLU/RSV testing.  Fact Sheet for Patients: EntrepreneurPulse.com.au  Fact Sheet for Healthcare Providers: IncredibleEmployment.be  This test is not yet approved or cleared by the [place] FDA and has been authorized for detection and/or diagnosis of SARS-CoV-2 by FDA under an Emergency Use Authorization (EUA). This EUA will remain in effect (meaning this test can be used) for the duration of  the COVID-19 declaration under Section 564(b)(1) of the Act, 21 U.S.C. section 360bbb-3(b)(1), unless the authorization is terminated or revoked.  Performed at [facility], [place] [address] [place], [place] [address]           Radiology Studies: No results found.      Scheduled Meds:  bisacodyl  10 mg Oral Daily   chlorhexidine  10 mL Mouth/Throat QID   Chlorhexidine Gluconate Cloth  6 each Topical Daily   feeding supplement (PROSource TF)  90 mL Per Tube TID   fentaNYL  1 patch Transdermal Q72H   free water  30 mL Per Tube Q4H   glycopyrrolate  1 mg Oral BID   heparin lock flush  500 Units Intravenous Once   loratadine  10 mg Per Tube Daily   metoCLOPramide  5 mg Per Tube QID   pantoprazole (PROTONIX) IV  40 mg Intravenous Q24H   scopolamine  1 patch Transdermal Q72H   sodium chloride flush  10-40 mL Intracatheter Q12H   sodium chloride HYPERTONIC  4 mL Nebulization Once   Continuous Infusions:  feeding supplement (OSMOLITE 1.5 CAL) 1,000 mL (02/03/21 0503)   lactated ringers 50 mL/hr at 02/02/21 1641     LOS: 4 days    Time spent:    Georgette Shell, MD 02/03/2021, 2:39 PM

## 2021-02-03 NOTE — Progress Notes (Signed)
Gerald [name]   DOB:1971/02/24   JK#:[id number]   YKD#:[id number]  Subjective:   He is doing better today Everyday feels a bit better. Secretions have certainly gotten better on robinul He had one episode of nausea today, had loose bowel movement today after dulcolax.  He says he is getting dehydrated, and would want to restart the fluids. No other concerns.  Objective:  Vitals:   02/03/21 1138 02/03/21 1432  BP: 95/69 105/73  Pulse: 82 79  Resp: 14 14  Temp: 99 F (37.2 C) 98.8 F (37.1 C)  SpO2:  97%    Body mass index is 33.53 kg/m.  Intake/Output Summary (Last 24 hours) at 02/03/2021 1831 Last data filed at 02/03/2021 1432 Gross per 24 hour  Intake 4687.73 ml  Output 400 ml  Net 4287.73 ml     Sclerae anicteric  He returned from rad in a wheel chair             No LE edema             He notices some change in hearing.   CBG (last 3)  No results for input(s): GLUCAP in the last 72 hours.   Labs:  Lab Results  Component Value Date   WBC 2.0 (L) 02/03/2021   HGB 7.6 (L) 02/03/2021   HCT 21.8 (L) 02/03/2021   MCV 91.6 02/03/2021   PLT 93 (L) 02/03/2021   NEUTROABS 0.9 (L) 02/03/2021    @LASTCHEMISTRY @  Urine Studies No results for input(s): UHGB, CRYS in the last 72 hours.  Invalid input(s): UACOL, UAPR, USPG, UPH, UTP, UGL, UKET, UBIL, UNIT, UROB, ULEU, UEPI, UWBC, URBC, UBAC, CAST, [place], [place]  Basic Metabolic Panel: Recent Labs  Lab 01/29/21 1650 01/29/21 2140 01/30/21 0353 02/03/21 0509  NA 136  --  136 134*  K 3.9  --  3.9 3.6  CL 102  --  103 102  CO2 26  --  29 27  GLUCOSE 123*  --  94 115*  BUN 27*  --  23* 21*  CREATININE 1.07  --  1.04 0.97  CALCIUM 8.7*  --  8.6* 8.4*  MG  --  1.9 2.0  --    GFR Estimated Creatinine Clearance: 113.6 mL/min (by C-G formula based on SCr of 0.97 mg/dL). Liver Function Tests: Recent Labs  Lab 01/29/21 1650 01/30/21 0353 02/03/21 0509  AST 25 21 20   ALT 39 38 42  ALKPHOS 53 47 69  BILITOT 1.5*  1.3* 1.4*  PROT 5.8* 5.1* 5.8*  ALBUMIN 3.5 3.0* 3.5   Recent Labs  Lab 01/29/21 1650  LIPASE 25   No results for input(s): AMMONIA in the last 168 hours. Coagulation profile Recent Labs  Lab 01/29/21 1650 01/30/21 0353  INR 1.1 1.1    CBC: Recent Labs  Lab 01/30/21 0353 01/30/21 2106 01/31/21 0330 02/01/21 0355 02/02/21 0512 02/03/21 0509 02/03/21 1601  WBC 2.0*  --  1.3* 1.3* 1.5* 1.5* 2.0*  NEUTROABS 1.4*  --  0.9* 0.9* 1.0* 0.9*  --   HGB 7.0*   < > 7.0* 7.2* 6.3* 7.1* 7.6*  HCT 19.7*   < > 19.7* 20.2* 17.3* 19.6* 21.8*  MCV 95.2  --  93.4 94.0 93.5 92.5 91.6  PLT 90*  --  96* 106* 92* 98* 93*   < > = values in this interval not displayed.   Cardiac Enzymes: No results for input(s): CKTOTAL, CKMB, CKMBINDEX, TROPONINI in the last 168 hours. BNP: Invalid input(s): POCBNP CBG:  No results for input(s): GLUCAP in the last 168 hours. D-Dimer No results for input(s): DDIMER in the last 72 hours. Hgb A1c No results for input(s): HGBA1C in the last 72 hours. Lipid Profile No results for input(s): CHOL, HDL, LDLCALC, TRIG, CHOLHDL, LDLDIRECT in the last 72 hours. Thyroid function studies No results for input(s): TSH, T4TOTAL, T3FREE, THYROIDAB in the last 72 hours.  Invalid input(s): FREET3 Anemia work up No results for input(s): VITAMINB12, FOLATE, FERRITIN, TIBC, IRON, RETICCTPCT in the last 72 hours. Microbiology Recent Results (from the past 240 hour(s))  Resp Panel by RT-PCR (Flu A&B, Covid) Nasopharyngeal Swab     Status: None   Collection Time: 01/29/21  6:26 PM   Specimen: Nasopharyngeal Swab; Nasopharyngeal(NP) swabs in vial transport medium  Result Value Ref Range Status   SARS Coronavirus 2 by RT PCR NEGATIVE NEGATIVE Final    Comment: (NOTE) SARS-CoV-2 target nucleic acids are NOT DETECTED.  The SARS-CoV-2 RNA is generally detectable in upper respiratory specimens during the acute phase of infection. The lowest concentration of SARS-CoV-2 viral  copies this assay can detect is 138 copies/mL. A negative result does not preclude SARS-Cov-2 infection and should not be used as the sole basis for treatment or other patient management decisions. A negative result may occur with  improper specimen collection/handling, submission of specimen other than nasopharyngeal swab, presence of viral mutation(s) within the areas targeted by this assay, and inadequate number of viral copies(<138 copies/mL). A negative result must be combined with clinical observations, patient history, and epidemiological information. The expected result is Negative.  Fact Sheet for Patients:  EntrepreneurPulse.com.au  Fact Sheet for Healthcare Providers:  IncredibleEmployment.be  This test is no t yet approved or cleared by the [place] FDA and  has been authorized for detection and/or diagnosis of SARS-CoV-2 by FDA under an Emergency Use Authorization (EUA). This EUA will remain  in effect (meaning this test can be used) for the duration of the COVID-19 declaration under Section 564(b)(1) of the Act, 21 U.S.C.section 360bbb-3(b)(1), unless the authorization is terminated  or revoked sooner.       Influenza A by PCR NEGATIVE NEGATIVE Final   Influenza B by PCR NEGATIVE NEGATIVE Final    Comment: (NOTE) The Xpert Xpress SARS-CoV-2/FLU/RSV plus assay is intended as an aid in the diagnosis of influenza from Nasopharyngeal swab specimens and should not be used as a sole basis for treatment. Nasal washings and aspirates are unacceptable for Xpert Xpress SARS-CoV-2/FLU/RSV testing.  Fact Sheet for Patients: EntrepreneurPulse.com.au  Fact Sheet for Healthcare Providers: IncredibleEmployment.be  This test is not yet approved or cleared by the [place] FDA and has been authorized for detection and/or diagnosis of SARS-CoV-2 by FDA under an Emergency Use Authorization (EUA). This  EUA will remain in effect (meaning this test can be used) for the duration of the COVID-19 declaration under Section 564(b)(1) of the Act, 21 U.S.C. section 360bbb-3(b)(1), unless the authorization is terminated or revoked.  Performed at [facility], [place] [address] [place], [place] [address]       Studies:  No results found.  Assessment: 50 y.o. with SCC of orophayrnx, HPV positive admitted with intractable nausea and vomiting. He is doing better since we started robinul, had only one episode of nausea today,  He received a second unit of PRBC today Chemotherapy held this week given neutropenia We will continue supportive care with PRN nausea medication, tube feeds, IVF as tolerated and anticholinergic for secretion control. PRBC to  transfuse to maintain a Hb of 7 g/dl We will follow him periodically.  We appreciate assistance of hospitalist team in managing this patient.  Benay Pike, MD 02/03/2021  6:31 PM

## 2021-02-04 ENCOUNTER — Ambulatory Visit
Admission: RE | Admit: 2021-02-04 | Discharge: 2021-02-04 | Disposition: A | Discharge status: Home / Self Care | Payer: Managed Care, Other (non HMO) | Source: Ambulatory Visit | Attending: Radiation Oncology | Admitting: Radiation Oncology

## 2021-02-04 DIAGNOSIS — R112 Nausea with vomiting, unspecified: Secondary | ICD-10-CM | POA: Diagnosis not present

## 2021-02-04 LAB — CBC WITH DIFFERENTIAL/PLATELET
Abs Immature Granulocytes: 0.01 10*3/uL (ref 0.00–0.07)
Basophils Absolute: 0 10*3/uL (ref 0.0–0.1)
Basophils Relative: 0 %
Eosinophils Absolute: 0 10*3/uL (ref 0.0–0.5)
Eosinophils Relative: 1 %
HCT: 20.4 % — ABNORMAL LOW (ref 39.0–52.0)
Hemoglobin: 7.1 g/dL — ABNORMAL LOW (ref 13.0–17.0)
Immature Granulocytes: 1 %
Lymphocytes Relative: 24 %
Lymphs Abs: 0.3 10*3/uL — ABNORMAL LOW (ref 0.7–4.0)
MCH: 32.3 pg (ref 26.0–34.0)
MCHC: 34.8 g/dL (ref 30.0–36.0)
MCV: 92.7 fL (ref 80.0–100.0)
Monocytes Absolute: 0.1 10*3/uL (ref 0.1–1.0)
Monocytes Relative: 7 %
Neutro Abs: 0.9 10*3/uL — ABNORMAL LOW (ref 1.7–7.7)
Neutrophils Relative %: 67 %
Platelets: 90 10*3/uL — ABNORMAL LOW (ref 150–400)
RBC: 2.2 MIL/uL — ABNORMAL LOW (ref 4.22–5.81)
RDW: 13.9 % (ref 11.5–15.5)
WBC: 1.4 10*3/uL — CL (ref 4.0–10.5)
nRBC: 0 % (ref 0.0–0.2)

## 2021-02-04 LAB — COMPREHENSIVE METABOLIC PANEL
ALT: 32 U/L (ref 0–44)
AST: 16 U/L (ref 15–41)
Albumin: 3.1 g/dL — ABNORMAL LOW (ref 3.5–5.0)
Alkaline Phosphatase: 61 U/L (ref 38–126)
Anion gap: 6 (ref 5–15)
BUN: 20 mg/dL (ref 6–20)
CO2: 27 mmol/L (ref 22–32)
Calcium: 8.3 mg/dL — ABNORMAL LOW (ref 8.9–10.3)
Chloride: 102 mmol/L (ref 98–111)
Creatinine, Ser: 0.91 mg/dL (ref 0.61–1.24)
GFR, Estimated: >60 mL/min (ref >60)
Glucose, Bld: 114 mg/dL — ABNORMAL HIGH (ref 70–99)
Potassium: 3.8 mmol/L (ref 3.5–5.1)
Sodium: 135 mmol/L (ref 135–145)
Total Bilirubin: 0.8 mg/dL (ref 0.3–1.2)
Total Protein: 5.3 g/dL — ABNORMAL LOW (ref 6.5–8.1)

## 2021-02-04 MED ORDER — OSMOLITE 1.5 CAL PO LIQD
237.0000 mL | Freq: Four times a day (QID) | ORAL | Status: DC
  Administered 2021-02-04 – 2021-02-05 (×3): 237 mL
  Filled 2021-02-04 (×6): qty 237

## 2021-02-04 MED ORDER — OSMOLITE 1.5 CAL PO LIQD
237.0000 mL | Freq: Four times a day (QID) | ORAL | Status: DC
  Filled 2021-02-04: qty 237

## 2021-02-04 MED ORDER — SCOPOLAMINE 1 MG/3DAYS TD PT72
1.0000 | MEDICATED_PATCH | TRANSDERMAL | Status: DC
  Administered 2021-02-04 – 2021-02-07 (×2): 1.5 mg via TRANSDERMAL
  Filled 2021-02-04 (×2): qty 1

## 2021-02-04 NOTE — Progress Notes (Signed)
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  Assessment & Plan:   Principal Problem:   Intractable nausea and vomiting Active Problems:   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HCC)   Dehydration   Pancytopenia (HCC)   GERD (gastroesophageal reflux disease)    #1 intractable nausea and vomiting refractory to outpatient Zofran and Compazine.   Continue scopolamine patch and Robinul and Zofran.   I have added Ativan.   Chlorhexidine mouthwash and gargle, PPI.   He had bowel movements with Dulcolax.  He asked me to stop the scopolamine patch as he thought it was not working but after he stopped it he thought it made a difference to his symptoms and he requested me to restart it.  #2 pancytopenia improving likely secondary to recent chemo.  ANC still low 900.  Follow-up CBC in a.m.  #3 tonsillar squamous cell carcinoma followed by Dr.Iruku patient has a PEG tube in place and is receiving nutrition through it.  We will start bolus feedings like how he was doing at home in order to prepare him to go home.  #4 GERD continue Protonix  #5 anemia hemoglobin 7.6.  He received a total of 2 units of packed RBCs during this admission.    Nutrition Problem: Inadequate oral intake Etiology: cancer and cancer related treatments     Signs/Symptoms: other (comment) (pt with G-tube)   Estimated body mass index is 33.59 kg/m as calculated from the following:   Height as of this encounter: 5\' 10"  (1.778 m).   Weight as of this encounter: 106.2 kg.  DVT prophylaxis: SCD  code Status: Full code  family Communication: None at bedside  disposition Plan:  Status is: Inpatient    Dispo: The patient is from: Home              Anticipated d/c is to: Home               Patient currently is not medically stable to d/c.   Difficult to place patient No       Consultants:  Oncology  Procedures: None Antimicrobials: None Subjective: He is resting in bed he feels about the same as yesterday had bowel movements with medications no nausea vomiting reported he received 2 units of packed RBCs his hemoglobin is 7.6 Objective: Vitals:   02/03/21 1432 02/03/21 2102 02/04/21 0445 02/04/21 0650  BP: 105/73 (!) 83/54 (!) 81/55 90/66  Pulse: 79 78 74 93  Resp: 14 14 14    Temp: 98.8 F (37.1 C) 99 F (37.2 C) 98.5 F (36.9 C)   TempSrc: Oral Oral Oral   SpO2: 97% 95% 97%   Weight:   106.2 kg   Height:        Intake/Output Summary (Last 24 hours) at 02/04/2021 1326 Last data filed at 02/03/2021 2109 Gross per 24 hour  Intake 378 ml  Output 600 ml  Net -222 ml    Filed Weights   02/02/21 0404 02/03/21 0500 02/04/21 0445  Weight: 103.4 kg 106 kg 106.2 kg    Examination:  General exam: Appears calm and comfortable  Respiratory system: Clear to auscultation. Respiratory effort normal. Cardiovascular system: S1 & S2 heard, RRR. No JVD, murmurs, rubs, gallops or clicks. No pedal edema. Gastrointestinal system: Abdomen is nondistended, soft and nontender. No organomegaly  or masses felt. Normal bowel sounds heard.peg in place  Central nervous system: Alert and oriented. No focal neurological deficits. Extremities: Symmetric 5 x 5 power. Skin: No rashes, lesions or ulcers Psychiatry: Judgement and insight appear normal. Mood & affect appropriate.     Data Reviewed: I have personally reviewed following labs and imaging studies  CBC: Recent Labs  Lab 01/31/21 0330 02/01/21 0355 02/02/21 0512 02/03/21 0509 02/03/21 1601 02/04/21 0450  WBC 1.3* 1.3* 1.5* 1.5* 2.0* 1.4*  NEUTROABS 0.9* 0.9* 1.0* 0.9*  --  0.9*  HGB 7.0* 7.2* 6.3* 7.1* 7.6* 7.1*  HCT 19.7* 20.2* 17.3* 19.6* 21.8* 20.4*  MCV 93.4 94.0 93.5 92.5 91.6 92.7  PLT 96* 106* 92* 98* 93*  90*    Basic Metabolic Panel: Recent Labs  Lab 01/29/21 1650 01/29/21 2140 01/30/21 0353 02/03/21 0509 02/04/21 0450  NA 136  --  136 134* 135  K 3.9  --  3.9 3.6 3.8  CL 102  --  103 102 102  CO2 26  --  29 27 27   GLUCOSE 123*  --  94 115* 114*  BUN 27*  --  23* 21* 20  CREATININE 1.07  --  1.04 0.97 0.91  CALCIUM 8.7*  --  8.6* 8.4* 8.3*  MG  --  1.9 2.0  --   --     GFR: Estimated Creatinine Clearance: 121.2 mL/min (by C-G formula based on SCr of 0.91 mg/dL). Liver Function Tests: Recent Labs  Lab 01/29/21 1650 01/30/21 0353 02/03/21 0509 02/04/21 0450  AST 25 21 20 16   ALT 39 38 42 32  ALKPHOS 53 47 69 61  BILITOT 1.5* 1.3* 1.4* 0.8  PROT 5.8* 5.1* 5.8* 5.3*  ALBUMIN 3.5 3.0* 3.5 3.1*    Recent Labs  Lab 01/29/21 1650  LIPASE 25    No results for input(s): AMMONIA in the last 168 hours. Coagulation Profile: Recent Labs  Lab 01/29/21 1650 01/30/21 0353  INR 1.1 1.1    Cardiac Enzymes: No results for input(s): CKTOTAL, CKMB, CKMBINDEX, TROPONINI in the last 168 hours. BNP (last 3 results) No results for input(s): PROBNP in the last 8760 hours. HbA1C: No results for input(s): HGBA1C in the last 72 hours. CBG: No results for input(s): GLUCAP in the last 168 hours. Lipid Profile: No results for input(s): CHOL, HDL, LDLCALC, TRIG, CHOLHDL, LDLDIRECT in the last 72 hours. Thyroid Function Tests: No results for input(s): TSH, T4TOTAL, FREET4, T3FREE, THYROIDAB in the last 72 hours. Anemia Panel: No results for input(s): VITAMINB12, FOLATE, FERRITIN, TIBC, IRON, RETICCTPCT in the last 72 hours. Sepsis Labs: Recent Labs  Lab 01/29/21 1650  LATICACIDVEN 1.7     Recent Results (from the past 240 hour(s))  Resp Panel by RT-PCR (Flu A&B, Covid) Nasopharyngeal Swab     Status: None   Collection Time: 01/29/21  6:26 PM   Specimen: Nasopharyngeal Swab; Nasopharyngeal(NP) swabs in vial transport medium  Result Value Ref Range Status   SARS  Coronavirus 2 by RT PCR NEGATIVE NEGATIVE Final    Comment: (NOTE) SARS-CoV-2 target nucleic acids are NOT DETECTED.  The SARS-CoV-2 RNA is generally detectable in upper respiratory specimens during the acute phase of infection. The lowest concentration of SARS-CoV-2 viral copies this assay can detect is 138 copies/mL. A negative result does not preclude SARS-Cov-2 infection and should not be used as the sole basis for treatment or other patient management decisions. A negative result may occur with  improper specimen collection/handling, submission of specimen other  than nasopharyngeal swab, presence of viral mutation(s) within the areas targeted by this assay, and inadequate number of viral copies(<138 copies/mL). A negative result must be combined with clinical observations, patient history, and epidemiological information. The expected result is Negative.  Fact Sheet for Patients:  EntrepreneurPulse.com.au  Fact Sheet for Healthcare Providers:  IncredibleEmployment.be  This test is no t yet approved or cleared by the [place] FDA and  has been authorized for detection and/or diagnosis of SARS-CoV-2 by FDA under an Emergency Use Authorization (EUA). This EUA will remain  in effect (meaning this test can be used) for the duration of the COVID-19 declaration under Section 564(b)(1) of the Act, 21 U.S.C.section 360bbb-3(b)(1), unless the authorization is terminated  or revoked sooner.       Influenza A by PCR NEGATIVE NEGATIVE Final   Influenza B by PCR NEGATIVE NEGATIVE Final    Comment: (NOTE) The Xpert Xpress SARS-CoV-2/FLU/RSV plus assay is intended as an aid in the diagnosis of influenza from Nasopharyngeal swab specimens and should not be used as a sole basis for treatment. Nasal washings and aspirates are unacceptable for Xpert Xpress SARS-CoV-2/FLU/RSV testing.  Fact Sheet for  Patients: EntrepreneurPulse.com.au  Fact Sheet for Healthcare Providers: IncredibleEmployment.be  This test is not yet approved or cleared by the [place] FDA and has been authorized for detection and/or diagnosis of SARS-CoV-2 by FDA under an Emergency Use Authorization (EUA). This EUA will remain in effect (meaning this test can be used) for the duration of the COVID-19 declaration under Section 564(b)(1) of the Act, 21 U.S.C. section 360bbb-3(b)(1), unless the authorization is terminated or revoked.  Performed at [facility], [place] [address] [place] [address], [place], [place] [address]           Radiology Studies: No results found.      Scheduled Meds:  bisacodyl  10 mg Oral Daily   chlorhexidine  10 mL Mouth/Throat QID   Chlorhexidine Gluconate Cloth  6 each Topical Daily   feeding supplement (OSMOLITE 1.5 CAL)  237 mL Per Tube QID   feeding supplement (PROSource TF)  90 mL Per Tube TID   fentaNYL  1 patch Transdermal Q72H   free water  30 mL Per Tube Q4H   glycopyrrolate  1 mg Oral BID   heparin lock flush  500 Units Intravenous Once   loratadine  10 mg Per Tube Daily   metoCLOPramide  5 mg Per Tube QID   pantoprazole (PROTONIX) IV  40 mg Intravenous Q24H   [START ON 02/05/2021] scopolamine  1 patch Transdermal Q72H   sodium chloride flush  10-40 mL Intracatheter Q12H   sodium chloride HYPERTONIC  4 mL Nebulization Once   Continuous Infusions:  sodium chloride 75 mL/hr at 02/04/21 0501     LOS: 5 days    Time spent:    Georgette Shell, MD 02/04/2021, 1:26 PM

## 2021-02-04 NOTE — TOC Initial Note (Signed)
Transition of Care [facility]) - Initial/Assessment Note    Patient Details  Name: Gerald [name] MRN: [id number] Date of Birth: Oct 16, 1970  Transition of [facility]) CM/SW Contact:    Redith Drach, Marjie Skiff, RN Phone Number: 02/04/2021, 2:49 PM  Clinical Narrative:                 Pt from home with spouse and was receiving bolus tube feeds at home prior to admission. Nutrition is working with pt to hopefully get him back on his bolus feeds for DC. TOC will continue to follow along.  Expected Discharge Plan: Home/Self Care Barriers to Discharge: Continued Medical Work up  Expected Discharge Plan and Services Expected Discharge Plan: Home/Self Care   Discharge Planning Services: CM Consult   Living arrangements for the past 2 months: Single Family Home                       Prior Living Arrangements/Services Living arrangements for the past 2 months: Single Family Home Lives with:: Spouse Patient language and need for interpreter reviewed:: Yes        Need for Family Participation in Patient Care: Yes (Comment) Care giver support system in place?: Yes (comment)   Criminal Activity/Legal Involvement Pertinent to Current Situation/Hospitalization: No - Comment as needed  Activities of Daily Living Home Assistive Devices/Equipment: Eyeglasses ADL Screening (condition at time of admission) Patient's cognitive ability adequate to safely complete daily activities?: Yes Is the patient deaf or have difficulty hearing?: No Does the patient have difficulty seeing, even when wearing glasses/contacts?: No (uses glasses) Does the patient have difficulty concentrating, remembering, or making decisions?: No Patient able to express need for assistance with ADLs?: Yes Does the patient have difficulty dressing or bathing?: No Independently performs ADLs?: Yes (appropriate for developmental age) Does the patient have difficulty walking or climbing stairs?: Yes (gets SOB when climbing up) Weakness of  Legs: None Weakness of Arms/Hands: None  Permission Sought/Granted                  Emotional Assessment              Admission diagnosis:  Intractable nausea and vomiting [R11.2] Intractable vomiting with nausea, unspecified vomiting type [R11.2] Patient Active Problem List   Diagnosis Date Noted   Intractable nausea and vomiting 01/29/2021   Dehydration 01/29/2021   Pancytopenia ([place]) 01/29/2021   GERD (gastroesophageal reflux disease) 01/29/2021   Weight loss, unintentional 01/04/2021   Tinnitus 01/04/2021   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   Head and neck cancer ([place]) 12/14/2020   Cancer of tonsillar fossa ([place]) 12/07/2020   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[place]) 12/06/2020   Neoplasm related pain 12/06/2020   Impotence due to erectile dysfunction 08/30/2012   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   PCP:  Pcp, No Pharmacy:   [facility] [id number] [place], [place] [address] Phone: [phone] Fax: [phone]     Social Determinants of Health (SDOH) Interventions    Readmission Risk Interventions Readmission Risk Prevention Plan 02/04/2021  Transportation Screening Complete  PCP or Specialist Appt within 3-5 Days Complete  HRI or Home Care Consult Complete  Social Work Consult for [place] Planning/Counseling Complete  Palliative Care Screening Not Applicable  Medication Review Press photographer) Complete  Some recent data might be hidden

## 2021-02-04 NOTE — Progress Notes (Signed)
Nutrition Follow-up  DOCUMENTATION CODES:   Obesity unspecified  INTERVENTION:   -Initiate trial of bolus feeds 7/15: 120 ml Osmolite 1.5 at 1200 and 1400 237 ml Osmolite 1.5 at 1700 and 2000 Flush with 30 ml free water before and after each bolus  If tolerated on 7/15, can increase to 1 carton (237 ml) Osmolite 1.5 QID via PEG Continue 30 ml free water before and after each bolus  -90 ml Prosource TF TID, each provides 40 kcals and 11g protein  -Continue bowel regimen  If pt does not tolerate bolus regimen, resume Osmolite 1.5 @ 20 ml/hr and advance by 10 ml every 12 hours to goal of 70 ml/hr. 45 ml Prosource TF BID.  **Still recommend TPN if enteral feeding is unsuccessful.  NUTRITION DIAGNOSIS:   Inadequate oral intake related to cancer and cancer related treatments as evidenced by other (comment) (pt with G-tube).  Ongoing  GOAL:   Patient will meet greater than or equal to 90% of their needs  TF off so currently not meeting.  MONITOR:   PO intake, Labs, Weight trends, TF tolerance, Skin, I & O's  REASON FOR ASSESSMENT:   Consult Enteral/tube feeding initiation and management, Assessment of nutrition requirement/status  ASSESSMENT:   50 y.o. male with medical history significant for tonsillar squamous cell carcinoma with metastasis to cervical lymph nodes undergoing chemotherapy, G-tube and GERD who is admitted to [facility] on 01/29/2021 with intractable nausea/vomiting.  Patient in room, states his secretions have gotten better. Only needed to use suction once during visit. Pt states he feels better after BM. Yesterday, pt's TF increased to 40-50 ml/hr. Felt like he cannot go this high. Reviewed how much volume this is and pt still felt like continuous feeds are not working for him. Pt is wanting to trial a bolus feed as he feels he may tolerate this better than the continuous feeds. States he tolerates when he receives Prosource and medications through  tube. I have ordered this but discussed continuous feeds if these are not successful. Pt understands. Reviewed pt's goal with bolus regimen. Pt aware as this was what he was doing prior to his worsening secretions. Pt followed by [place] [address], will route notes to them.  Admission weight: 233 lbs. Current weight: 234 lbs.  Medications: Dulcolax, Reglan, IV Zofran  Labs reviewed.  Diet Order:   Diet Order             Diet clear liquid Room service appropriate? Yes; Fluid consistency: Thin  Diet effective now                   EDUCATION NEEDS:   No education needs have been identified at this time  Skin:  Skin Assessment: Reviewed RN Assessment  Last BM:  7/14 -type 4  Height:   Ht Readings from Last 1 Encounters:  02/01/21 5\' 10"  (1.778 m)    Weight:   Wt Readings from Last 1 Encounters:  02/04/21 106.2 kg    Ideal Body Weight:  75.45 kg  BMI:  Body mass index is 33.59 kg/m.  Estimated Nutritional Needs:   Kcal:  2400-2700kcal/day  Protein:  >120g/day  Fluid:  2.3-2.6L/day  Clayton Bibles, MS, RD, LDN Inpatient Clinical Dietitian Contact information available via Amion

## 2021-02-05 ENCOUNTER — Inpatient Hospital Stay: Payer: Managed Care, Other (non HMO)

## 2021-02-05 DIAGNOSIS — R112 Nausea with vomiting, unspecified: Secondary | ICD-10-CM | POA: Diagnosis not present

## 2021-02-05 LAB — CBC WITH DIFFERENTIAL/PLATELET
Abs Immature Granulocytes: 0.01 10*3/uL (ref 0.00–0.07)
Basophils Absolute: 0 10*3/uL (ref 0.0–0.1)
Basophils Relative: 1 %
Eosinophils Absolute: 0 10*3/uL (ref 0.0–0.5)
Eosinophils Relative: 1 %
HCT: 19.4 % — ABNORMAL LOW (ref 39.0–52.0)
Hemoglobin: 6.8 g/dL — CL (ref 13.0–17.0)
Immature Granulocytes: 1 %
Lymphocytes Relative: 25 %
Lymphs Abs: 0.3 10*3/uL — ABNORMAL LOW (ref 0.7–4.0)
MCH: 32.7 pg (ref 26.0–34.0)
MCHC: 35.1 g/dL (ref 30.0–36.0)
MCV: 93.3 fL (ref 80.0–100.0)
Monocytes Absolute: 0.1 10*3/uL (ref 0.1–1.0)
Monocytes Relative: 9 %
Neutro Abs: 0.8 10*3/uL — ABNORMAL LOW (ref 1.7–7.7)
Neutrophils Relative %: 63 %
Platelets: 86 10*3/uL — ABNORMAL LOW (ref 150–400)
RBC: 2.08 MIL/uL — ABNORMAL LOW (ref 4.22–5.81)
RDW: 13.8 % (ref 11.5–15.5)
WBC: 1.3 10*3/uL — CL (ref 4.0–10.5)
nRBC: 0 % (ref 0.0–0.2)

## 2021-02-05 LAB — PREPARE RBC (CROSSMATCH)

## 2021-02-05 LAB — COMPREHENSIVE METABOLIC PANEL
ALT: 26 U/L (ref 0–44)
AST: 15 U/L (ref 15–41)
Albumin: 3.2 g/dL — ABNORMAL LOW (ref 3.5–5.0)
Alkaline Phosphatase: 59 U/L (ref 38–126)
Anion gap: 3 — ABNORMAL LOW (ref 5–15)
BUN: 21 mg/dL — ABNORMAL HIGH (ref 6–20)
CO2: 27 mmol/L (ref 22–32)
Calcium: 8.2 mg/dL — ABNORMAL LOW (ref 8.9–10.3)
Chloride: 106 mmol/L (ref 98–111)
Creatinine, Ser: 0.9 mg/dL (ref 0.61–1.24)
GFR, Estimated: >60 mL/min (ref >60)
Glucose, Bld: 94 mg/dL (ref 70–99)
Potassium: 3.8 mmol/L (ref 3.5–5.1)
Sodium: 136 mmol/L (ref 135–145)
Total Bilirubin: 1.3 mg/dL — ABNORMAL HIGH (ref 0.3–1.2)
Total Protein: 5.5 g/dL — ABNORMAL LOW (ref 6.5–8.1)

## 2021-02-05 MED ORDER — SODIUM CHLORIDE 0.9% IV SOLUTION: Freq: Once | INTRAVENOUS | Status: AC

## 2021-02-05 MED ORDER — PROSOURCE TF PO LIQD
45.0000 mL | Freq: Two times a day (BID) | ORAL | Status: DC
  Administered 2021-02-05 – 2021-02-08 (×6): 45 mL
  Filled 2021-02-05 (×6): qty 45

## 2021-02-05 MED ORDER — OSMOLITE 1.5 CAL PO LIQD
1000.0000 mL | ORAL | Status: DC
  Administered 2021-02-05: 1000 mL
  Filled 2021-02-05 (×6): qty 1000

## 2021-02-05 NOTE — Progress Notes (Signed)
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  Assessment & Plan:   Principal Problem:   Intractable nausea and vomiting Active Problems:   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HCC)   Dehydration   Pancytopenia (HCC)   GERD (gastroesophageal reflux disease)    #1 intractable nausea and vomiting refractory to outpatient Zofran and Compazine.   Continue scopolamine patch and Robinul and Zofran.   I have added Ativan.   Chlorhexidine mouthwash and gargle, PPI.   He had bowel movements with Dulcolax.  He asked me to stop the scopolamine patch as he thought it was not working but after he stopped it he thought it made a difference to his symptoms and he requested me to restart it.  #2 pancytopenia secondary to recent chemo.  ANC still low 800.  Follow-up CBC in a.m.  #3 tonsillar squamous cell carcinoma followed by Dr.Iruku patient has a PEG tube in place and is receiving nutrition through it.  We will start bolus feedings like how he was doing at home in order to prepare him to go home.  #4 GERD continue Protonix  #5 anemia hemoglobin 6.8 he received a total of 2 units of packed RBCs during this admission.  We will transfuse 2 units today.   Nutrition Problem: Inadequate oral intake Etiology: cancer and cancer related treatments     Signs/Symptoms: other (comment) (pt with G-tube)   Estimated body mass index is 33.56 kg/m as calculated from the following:   Height as of this encounter: 5\' 10"  (1.778 m).   Weight as of this encounter: 106.1 kg.  DVT prophylaxis: SCD  code Status: Full code  family Communication: None at bedside  disposition Plan:  Status is: Inpatient    Dispo: The patient is from: Home              Anticipated d/c is to:  Home              Patient currently is not medically stable to d/c.   Difficult to place patient No       Consultants:  Oncology  Procedures: None Antimicrobials: None Subjective: He had 2 episodes of nausea and vomiting in the last 24 hours.  He thought the and scopolamine patch was not helping and he asked me to stop it.  After stopping it he realized that it might have helped his nausea and vomiting he wanted me to restart it currently he is feeling better with the patch in place. Hemoglobin 6.8 no hematuria hematochezia Objective: Vitals:   02/04/21 1346 02/04/21 2054 02/05/21 0417 02/05/21 1056  BP: 106/67 97/68 (!) 92/59 110/63  Pulse: 72 88 70 66  Resp: 15 16 14    Temp: 99.2 F (37.3 C) 99.7 F (37.6 C) 98.8 F (37.1 C) 98.3 F (36.8 C)  TempSrc: Oral Oral Oral Oral  SpO2: 99% 95% 99% 100%  Weight:   106.1 kg   Height:        Intake/Output Summary (Last 24 hours) at 02/05/2021 1302 Last data filed at 02/05/2021 1104 Gross per 24 hour  Intake 120 ml  Output 1975 ml  Net -1855 ml    Filed Weights   02/03/21 0500 02/04/21 0445 02/05/21 0417  Weight: 106 kg 106.2 kg 106.1 kg    Examination:  General exam: Appears  calm and comfortable  Respiratory system: Clear to auscultation. Respiratory effort normal. Cardiovascular system: S1 & S2 heard, RRR. No JVD, murmurs, rubs, gallops or clicks. No pedal edema. Gastrointestinal system: Abdomen is nondistended, soft and nontender. No organomegaly or masses felt. Normal bowel sounds heard.peg in place  Central nervous system: Alert and oriented. No focal neurological deficits. Extremities: Symmetric 5 x 5 power. Skin: No rashes, lesions or ulcers Psychiatry: Judgement and insight appear normal. Mood & affect appropriate.     Data Reviewed: I have personally reviewed following labs and imaging studies  CBC: Recent Labs  Lab 02/01/21 0355 02/02/21 0512 02/03/21 0509 02/03/21 1601 02/04/21 0450 02/05/21 0500   WBC 1.3* 1.5* 1.5* 2.0* 1.4* 1.3*  NEUTROABS 0.9* 1.0* 0.9*  --  0.9* 0.8*  HGB 7.2* 6.3* 7.1* 7.6* 7.1* 6.8*  HCT 20.2* 17.3* 19.6* 21.8* 20.4* 19.4*  MCV 94.0 93.5 92.5 91.6 92.7 93.3  PLT 106* 92* 98* 93* 90* 86*    Basic Metabolic Panel: Recent Labs  Lab 01/29/21 1650 01/29/21 2140 01/30/21 0353 02/03/21 0509 02/04/21 0450 02/05/21 0500  NA 136  --  136 134* 135 136  K 3.9  --  3.9 3.6 3.8 3.8  CL 102  --  103 102 102 106  CO2 26  --  29 27 27 27   GLUCOSE 123*  --  94 115* 114* 94  BUN 27*  --  23* 21* 20 21*  CREATININE 1.07  --  1.04 0.97 0.91 0.90  CALCIUM 8.7*  --  8.6* 8.4* 8.3* 8.2*  MG  --  1.9 2.0  --   --   --     GFR: Estimated Creatinine Clearance: 122.4 mL/min (by C-G formula based on SCr of 0.9 mg/dL). Liver Function Tests: Recent Labs  Lab 01/29/21 1650 01/30/21 0353 02/03/21 0509 02/04/21 0450 02/05/21 0500  AST 25 21 20 16 15   ALT 39 38 42 32 26  ALKPHOS 53 47 69 61 59  BILITOT 1.5* 1.3* 1.4* 0.8 1.3*  PROT 5.8* 5.1* 5.8* 5.3* 5.5*  ALBUMIN 3.5 3.0* 3.5 3.1* 3.2*    Recent Labs  Lab 01/29/21 1650  LIPASE 25    No results for input(s): AMMONIA in the last 168 hours. Coagulation Profile: Recent Labs  Lab 01/29/21 1650 01/30/21 0353  INR 1.1 1.1    Cardiac Enzymes: No results for input(s): CKTOTAL, CKMB, CKMBINDEX, TROPONINI in the last 168 hours. BNP (last 3 results) No results for input(s): PROBNP in the last 8760 hours. HbA1C: No results for input(s): HGBA1C in the last 72 hours. CBG: No results for input(s): GLUCAP in the last 168 hours. Lipid Profile: No results for input(s): CHOL, HDL, LDLCALC, TRIG, CHOLHDL, LDLDIRECT in the last 72 hours. Thyroid Function Tests: No results for input(s): TSH, T4TOTAL, FREET4, T3FREE, THYROIDAB in the last 72 hours. Anemia Panel: No results for input(s): VITAMINB12, FOLATE, FERRITIN, TIBC, IRON, RETICCTPCT in the last 72 hours. Sepsis Labs: Recent Labs  Lab 01/29/21 1650   LATICACIDVEN 1.7     Recent Results (from the past 240 hour(s))  Resp Panel by RT-PCR (Flu A&B, Covid) Nasopharyngeal Swab     Status: None   Collection Time: 01/29/21  6:26 PM   Specimen: Nasopharyngeal Swab; Nasopharyngeal(NP) swabs in vial transport medium  Result Value Ref Range Status   SARS Coronavirus 2 by RT PCR NEGATIVE NEGATIVE Final    Comment: (NOTE) SARS-CoV-2 target nucleic acids are NOT DETECTED.  The SARS-CoV-2 RNA is generally detectable in upper  respiratory specimens during the acute phase of infection. The lowest concentration of SARS-CoV-2 viral copies this assay can detect is 138 copies/mL. A negative result does not preclude SARS-Cov-2 infection and should not be used as the sole basis for treatment or other patient management decisions. A negative result may occur with  improper specimen collection/handling, submission of specimen other than nasopharyngeal swab, presence of viral mutation(s) within the areas targeted by this assay, and inadequate number of viral copies(<138 copies/mL). A negative result must be combined with clinical observations, patient history, and epidemiological information. The expected result is Negative.  Fact Sheet for Patients:  EntrepreneurPulse.com.au  Fact Sheet for Healthcare Providers:  IncredibleEmployment.be  This test is no t yet approved or cleared by the [place] FDA and  has been authorized for detection and/or diagnosis of SARS-CoV-2 by FDA under an Emergency Use Authorization (EUA). This EUA will remain  in effect (meaning this test can be used) for the duration of the COVID-19 declaration under Section 564(b)(1) of the Act, 21 U.S.C.section 360bbb-3(b)(1), unless the authorization is terminated  or revoked sooner.       Influenza A by PCR NEGATIVE NEGATIVE Final   Influenza B by PCR NEGATIVE NEGATIVE Final    Comment: (NOTE) The Xpert Xpress SARS-CoV-2/FLU/RSV plus  assay is intended as an aid in the diagnosis of influenza from Nasopharyngeal swab specimens and should not be used as a sole basis for treatment. Nasal washings and aspirates are unacceptable for Xpert Xpress SARS-CoV-2/FLU/RSV testing.  Fact Sheet for Patients: EntrepreneurPulse.com.au  Fact Sheet for Healthcare Providers: IncredibleEmployment.be  This test is not yet approved or cleared by the [place] FDA and has been authorized for detection and/or diagnosis of SARS-CoV-2 by FDA under an Emergency Use Authorization (EUA). This EUA will remain in effect (meaning this test can be used) for the duration of the COVID-19 declaration under Section 564(b)(1) of the Act, 21 U.S.C. section 360bbb-3(b)(1), unless the authorization is terminated or revoked.  Performed at [facility], [place] [address] [place], [place] [address]           Radiology Studies: No results found.      Scheduled Meds:  sodium chloride   Intravenous Once   bisacodyl  10 mg Oral Daily   chlorhexidine  10 mL Mouth/Throat QID   Chlorhexidine Gluconate Cloth  6 each Topical Daily   feeding supplement (OSMOLITE 1.5 CAL)  237 mL Per Tube QID   feeding supplement (PROSource TF)  90 mL Per Tube TID   fentaNYL  1 patch Transdermal Q72H   free water  30 mL Per Tube Q4H   glycopyrrolate  1 mg Oral BID   heparin lock flush  500 Units Intravenous Once   loratadine  10 mg Per Tube Daily   metoCLOPramide  5 mg Per Tube QID   pantoprazole (PROTONIX) IV  40 mg Intravenous Q24H   scopolamine  1 patch Transdermal Q72H   sodium chloride flush  10-40 mL Intracatheter Q12H   sodium chloride HYPERTONIC  4 mL Nebulization Once   Continuous Infusions:  sodium chloride 75 mL/hr at 02/05/21 1224     LOS: 6 days    Time spent:    Georgette Shell, MD 02/05/2021, 1:02 PM

## 2021-02-05 NOTE — Progress Notes (Signed)
Brief Nutrition Follow Up  RD paged via on-call pager. Patient requesting to be transitioned back to feeding via pump as he feels bolus feedings are hard for him to manage. Patient was on continuous feedings on 7/13 but never reached goal rate (stayed at 30 ml/hr) as he felt like he could not tolerate a high rate. Will continue with pump but titrate to goal given issues with tolerance. May consider transition to nocturnal feedings once continuous is tolerated at goal rate.   Tube feeding:  -Osmolite 1.5 @ 30 ml/hr via PEG -Advance by 9ml/hr q 8 hours to goal rate of 70 ml/hr (1680 ml) -Pro-Source TF 92ml BID   Free water flushes 30ml q4 hours to maintain tube patency    Regimen provides 2600kcal/day, 127g/day protein and 1457ml/day free water.   Remains on prokinetic agent QID. If nausea/vomiting continues, consider j port extension or TPN.   Gerald Single MS, RD, LDN, CNSC Clinical Nutrition Pager listed in [place]

## 2021-02-06 ENCOUNTER — Other Ambulatory Visit: Payer: Self-pay

## 2021-02-06 DIAGNOSIS — R112 Nausea with vomiting, unspecified: Secondary | ICD-10-CM | POA: Diagnosis not present

## 2021-02-06 LAB — BPAM RBC
Blood Product Expiration Date: 202208112359
Blood Product Expiration Date: 202208122359
Blood Product Expiration Date: 202208162359
Blood Product Expiration Date: 202208162359
ISSUE DATE / TIME: 202207131100
ISSUE DATE / TIME: 202207141119
ISSUE DATE / TIME: 202207161239
ISSUE DATE / TIME: 202207161619
Unit Type and Rh: 5100
Unit Type and Rh: 5100
Unit Type and Rh: 5100
Unit Type and Rh: 5100

## 2021-02-06 LAB — COMPREHENSIVE METABOLIC PANEL
ALT: 23 U/L (ref 0–44)
AST: 17 U/L (ref 15–41)
Albumin: 3.3 g/dL — ABNORMAL LOW (ref 3.5–5.0)
Alkaline Phosphatase: 59 U/L (ref 38–126)
Anion gap: 5 (ref 5–15)
BUN: 17 mg/dL (ref 6–20)
CO2: 27 mmol/L (ref 22–32)
Calcium: 8.3 mg/dL — ABNORMAL LOW (ref 8.9–10.3)
Chloride: 104 mmol/L (ref 98–111)
Creatinine, Ser: 0.89 mg/dL (ref 0.61–1.24)
GFR, Estimated: >60 mL/min (ref >60)
Glucose, Bld: 101 mg/dL — ABNORMAL HIGH (ref 70–99)
Potassium: 3.7 mmol/L (ref 3.5–5.1)
Sodium: 136 mmol/L (ref 135–145)
Total Bilirubin: 1.4 mg/dL — ABNORMAL HIGH (ref 0.3–1.2)
Total Protein: 5.4 g/dL — ABNORMAL LOW (ref 6.5–8.1)

## 2021-02-06 LAB — TYPE AND SCREEN
ABO/RH(D): O POS
Antibody Screen: NEGATIVE
Unit division: 0
Unit division: 0
Unit division: 0
Unit division: 0

## 2021-02-06 LAB — CBC WITH DIFFERENTIAL/PLATELET
Abs Immature Granulocytes: 0 10*3/uL (ref 0.00–0.07)
Basophils Absolute: 0 10*3/uL (ref 0.0–0.1)
Basophils Relative: 0 %
Eosinophils Absolute: 0 10*3/uL (ref 0.0–0.5)
Eosinophils Relative: 2 %
HCT: 24.4 % — ABNORMAL LOW (ref 39.0–52.0)
Hemoglobin: 8.4 g/dL — ABNORMAL LOW (ref 13.0–17.0)
Immature Granulocytes: 0 %
Lymphocytes Relative: 22 %
Lymphs Abs: 0.3 10*3/uL — ABNORMAL LOW (ref 0.7–4.0)
MCH: 31.7 pg (ref 26.0–34.0)
MCHC: 34.4 g/dL (ref 30.0–36.0)
MCV: 92.1 fL (ref 80.0–100.0)
Monocytes Absolute: 0.1 10*3/uL (ref 0.1–1.0)
Monocytes Relative: 10 %
Neutro Abs: 0.9 10*3/uL — ABNORMAL LOW (ref 1.7–7.7)
Neutrophils Relative %: 66 %
Platelets: 85 10*3/uL — ABNORMAL LOW (ref 150–400)
RBC: 2.65 MIL/uL — ABNORMAL LOW (ref 4.22–5.81)
RDW: 13.7 % (ref 11.5–15.5)
WBC: 1.4 10*3/uL — CL (ref 4.0–10.5)
nRBC: 0 % (ref 0.0–0.2)

## 2021-02-06 MED ORDER — LIDOCAINE 4 % EX CREA
TOPICAL_CREAM | Freq: Once | CUTANEOUS | Status: AC
  Administered 2021-02-06: 1 via TOPICAL
  Filled 2021-02-06 (×2): qty 5

## 2021-02-06 MED ORDER — LACTULOSE 10 GM/15ML PO SOLN
20.0000 g | Freq: Once | ORAL | Status: AC
  Administered 2021-02-06: 20 g via ORAL
  Filled 2021-02-06: qty 30

## 2021-02-06 NOTE — Progress Notes (Signed)
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  Assessment & Plan:   Principal Problem:   Intractable nausea and vomiting Active Problems:   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HCC)   Dehydration   Pancytopenia (HCC)   GERD (gastroesophageal reflux disease)    #1 intractable nausea and vomiting refractory to outpatient Zofran and Compazine.   Continue scopolamine patch and Robinul and Zofran.   I have added Ativan.   Chlorhexidine mouthwash and gargle, PPI. He did not have any vomiting overnight.  #2 pancytopenia secondary to recent chemo.  White count and ANC trending up.  But still low.  #3 tonsillar squamous cell carcinoma followed by Dr.Iruku patient has a PEG tube in place and is receiving nutrition through it. Goal for tube feeds is 70 cc/h  #4 GERD continue Protonix  #5 anemia-likely due to recent chemo.  Patient has received 4 units of packed RBC during this hospital stay.  He has no evidence of gross bleeding.  Hemoccult pending.  Hemoglobin now 8.4.  Nutrition Problem: Inadequate oral intake Etiology: cancer and cancer related treatments     Signs/Symptoms: other (comment) (pt with G-tube)   Estimated body mass index is 33.56 kg/m as calculated from the following:   Height as of this encounter: 5\' 10"  (1.778 m).   Weight as of this encounter: 106.1 kg.  DVT prophylaxis: SCD  code Status: Full code  family Communication: None at bedside  disposition Plan:  Status is: Inpatient    Dispo: The patient is from: Home              Anticipated d/c is to: Home              Patient currently is not medically stable to d/c.   Difficult to place patient No       Consultants:  Oncology  Procedures: None Antimicrobials:  None Subjective: He is awake and alert he had a good night He denies any nausea vomiting overnight Tolerating tube feeds However has not had a bowel movement requesting something to move his bowels  Objective: Vitals:   02/05/21 1643 02/05/21 1930 02/05/21 2151 02/06/21 0610  BP: (!) 123/94 106/73 104/64 102/71  Pulse: 66 62 (!) 59 (!) 59  Resp: 17 16 18 18   Temp: 99.5 F (37.5 C) 99.3 F (37.4 C) 98.6 F (37 C) 98.6 F (37 C)  TempSrc: Oral Oral Oral Oral  SpO2: 99% 99% 96% 96%  Weight:      Height:        Intake/Output Summary (Last 24 hours) at 02/06/2021 0919 Last data filed at 02/06/2021 0333 Gross per 24 hour  Intake 4857.6 ml  Output 750 ml  Net 4107.6 ml    Filed Weights   02/03/21 0500 02/04/21 0445 02/05/21 0417  Weight: 106 kg 106.2 kg 106.1 kg    Examination:  General exam: Appears calm and comfortable  Respiratory system: Clear to auscultation. Respiratory effort normal. Cardiovascular system: S1 & S2 heard, RRR. No JVD, murmurs, rubs, gallops or clicks. No pedal edema. Gastrointestinal system: Abdomen is nondistended, soft and nontender. No organomegaly or masses felt. Normal bowel sounds heard.peg in place  Central nervous system: Alert and oriented. No focal neurological deficits. Extremities: Symmetric 5 x 5 power. Skin: No rashes, lesions or ulcers Psychiatry: Judgement and  insight appear normal. Mood & affect appropriate.     Data Reviewed: I have personally reviewed following labs and imaging studies  CBC: Recent Labs  Lab 02/02/21 0512 02/03/21 0509 02/03/21 1601 02/04/21 0450 02/05/21 0500 02/06/21 0500  WBC 1.5* 1.5* 2.0* 1.4* 1.3* 1.4*  NEUTROABS 1.0* 0.9*  --  0.9* 0.8* 0.9*  HGB 6.3* 7.1* 7.6* 7.1* 6.8* 8.4*  HCT 17.3* 19.6* 21.8* 20.4* 19.4* 24.4*  MCV 93.5 92.5 91.6 92.7 93.3 92.1  PLT 92* 98* 93* 90* 86* 85*    Basic Metabolic Panel: Recent Labs  Lab 02/03/21 0509 02/04/21 0450 02/05/21 0500 02/06/21 0500  NA 134*  135 136 136  K 3.6 3.8 3.8 3.7  CL 102 102 106 104  CO2 27 27 27 27   GLUCOSE 115* 114* 94 101*  BUN 21* 20 21* 17  CREATININE 0.97 0.91 0.90 0.89  CALCIUM 8.4* 8.3* 8.2* 8.3*    GFR: Estimated Creatinine Clearance: 123.8 mL/min (by C-G formula based on SCr of 0.89 mg/dL). Liver Function Tests: Recent Labs  Lab 02/03/21 0509 02/04/21 0450 02/05/21 0500 02/06/21 0500  AST 20 16 15 17   ALT 42 32 26 23  ALKPHOS 69 61 59 59  BILITOT 1.4* 0.8 1.3* 1.4*  PROT 5.8* 5.3* 5.5* 5.4*  ALBUMIN 3.5 3.1* 3.2* 3.3*    No results for input(s): LIPASE, AMYLASE in the last 168 hours.  No results for input(s): AMMONIA in the last 168 hours. Coagulation Profile: No results for input(s): INR, PROTIME in the last 168 hours.  Cardiac Enzymes: No results for input(s): CKTOTAL, CKMB, CKMBINDEX, TROPONINI in the last 168 hours. BNP (last 3 results) No results for input(s): PROBNP in the last 8760 hours. HbA1C: No results for input(s): HGBA1C in the last 72 hours. CBG: No results for input(s): GLUCAP in the last 168 hours. Lipid Profile: No results for input(s): CHOL, HDL, LDLCALC, TRIG, CHOLHDL, LDLDIRECT in the last 72 hours. Thyroid Function Tests: No results for input(s): TSH, T4TOTAL, FREET4, T3FREE, THYROIDAB in the last 72 hours. Anemia Panel: No results for input(s): VITAMINB12, FOLATE, FERRITIN, TIBC, IRON, RETICCTPCT in the last 72 hours. Sepsis Labs: No results for input(s): PROCALCITON, LATICACIDVEN in the last 168 hours.   Recent Results (from the past 240 hour(s))  Resp Panel by RT-PCR (Flu A&B, Covid) Nasopharyngeal Swab     Status: None   Collection Time: 01/29/21  6:26 PM   Specimen: Nasopharyngeal Swab; Nasopharyngeal(NP) swabs in vial transport medium  Result Value Ref Range Status   SARS Coronavirus 2 by RT PCR NEGATIVE NEGATIVE Final    Comment: (NOTE) SARS-CoV-2 target nucleic acids are NOT DETECTED.  The SARS-CoV-2 RNA is generally detectable in upper  respiratory specimens during the acute phase of infection. The lowest concentration of SARS-CoV-2 viral copies this assay can detect is 138 copies/mL. A negative result does not preclude SARS-Cov-2 infection and should not be used as the sole basis for treatment or other patient management decisions. A negative result may occur with  improper specimen collection/handling, submission of specimen other than nasopharyngeal swab, presence of viral mutation(s) within the areas targeted by this assay, and inadequate number of viral copies(<138 copies/mL). A negative result must be combined with clinical observations, patient history, and epidemiological information. The expected result is Negative.  Fact Sheet for Patients:  EntrepreneurPulse.com.au  Fact Sheet for Healthcare Providers:  IncredibleEmployment.be  This test is no t yet approved or cleared by the [place] FDA and  has been authorized for detection  and/or diagnosis of SARS-CoV-2 by FDA under an Emergency Use Authorization (EUA). This EUA will remain  in effect (meaning this test can be used) for the duration of the COVID-19 declaration under Section 564(b)(1) of the Act, 21 U.S.C.section 360bbb-3(b)(1), unless the authorization is terminated  or revoked sooner.       Influenza A by PCR NEGATIVE NEGATIVE Final   Influenza B by PCR NEGATIVE NEGATIVE Final    Comment: (NOTE) The Xpert Xpress SARS-CoV-2/FLU/RSV plus assay is intended as an aid in the diagnosis of influenza from Nasopharyngeal swab specimens and should not be used as a sole basis for treatment. Nasal washings and aspirates are unacceptable for Xpert Xpress SARS-CoV-2/FLU/RSV testing.  Fact Sheet for Patients: EntrepreneurPulse.com.au  Fact Sheet for Healthcare Providers: IncredibleEmployment.be  This test is not yet approved or cleared by the [place] FDA and has been  authorized for detection and/or diagnosis of SARS-CoV-2 by FDA under an Emergency Use Authorization (EUA). This EUA will remain in effect (meaning this test can be used) for the duration of the COVID-19 declaration under Section 564(b)(1) of the Act, 21 U.S.C. section 360bbb-3(b)(1), unless the authorization is terminated or revoked.  Performed at [facility], [place] [address] [place], [place] [address]           Radiology Studies: No results found.      Scheduled Meds:  bisacodyl  10 mg Oral Daily   chlorhexidine  10 mL Mouth/Throat QID   Chlorhexidine Gluconate Cloth  6 each Topical Daily   feeding supplement (PROSource TF)  45 mL Per Tube BID   fentaNYL  1 patch Transdermal Q72H   free water  30 mL Per Tube Q4H   glycopyrrolate  1 mg Oral BID   heparin lock flush  500 Units Intravenous Once   loratadine  10 mg Per Tube Daily   metoCLOPramide  5 mg Per Tube QID   pantoprazole (PROTONIX) IV  40 mg Intravenous Q24H   scopolamine  1 patch Transdermal Q72H   sodium chloride flush  10-40 mL Intracatheter Q12H   sodium chloride HYPERTONIC  4 mL Nebulization Once   Continuous Infusions:  sodium chloride 75 mL/hr at 02/06/21 0333   feeding supplement (OSMOLITE 1.5 CAL) 1,000 mL (02/05/21 1551)     LOS: 7 days    Time spent:37 min    Georgette Shell, MD 02/06/2021, 9:19 AM

## 2021-02-07 ENCOUNTER — Ambulatory Visit: Payer: Managed Care, Other (non HMO) | Admitting: Hematology and Oncology

## 2021-02-07 ENCOUNTER — Ambulatory Visit
Admission: RE | Admit: 2021-02-07 | Discharge: 2021-02-07 | Disposition: A | Discharge status: Home / Self Care | Payer: Managed Care, Other (non HMO) | Source: Ambulatory Visit | Attending: Radiation Oncology | Admitting: Radiation Oncology

## 2021-02-07 ENCOUNTER — Other Ambulatory Visit: Payer: Managed Care, Other (non HMO)

## 2021-02-07 DIAGNOSIS — R112 Nausea with vomiting, unspecified: Secondary | ICD-10-CM | POA: Diagnosis not present

## 2021-02-07 LAB — CBC WITH DIFFERENTIAL/PLATELET
Abs Immature Granulocytes: 0 10*3/uL (ref 0.00–0.07)
Basophils Absolute: 0 10*3/uL (ref 0.0–0.1)
Basophils Relative: 0 %
Eosinophils Absolute: 0 10*3/uL (ref 0.0–0.5)
Eosinophils Relative: 1 %
HCT: 24 % — ABNORMAL LOW (ref 39.0–52.0)
Hemoglobin: 8.4 g/dL — ABNORMAL LOW (ref 13.0–17.0)
Immature Granulocytes: 0 %
Lymphocytes Relative: 34 %
Lymphs Abs: 0.4 10*3/uL — ABNORMAL LOW (ref 0.7–4.0)
MCH: 32.6 pg (ref 26.0–34.0)
MCHC: 35 g/dL (ref 30.0–36.0)
MCV: 93 fL (ref 80.0–100.0)
Monocytes Absolute: 0.1 10*3/uL (ref 0.1–1.0)
Monocytes Relative: 9 %
Neutro Abs: 0.6 10*3/uL — ABNORMAL LOW (ref 1.7–7.7)
Neutrophils Relative %: 56 %
Platelets: 87 10*3/uL — ABNORMAL LOW (ref 150–400)
RBC: 2.58 MIL/uL — ABNORMAL LOW (ref 4.22–5.81)
RDW: 14 % (ref 11.5–15.5)
WBC: 1.1 10*3/uL — CL (ref 4.0–10.5)
nRBC: 0 % (ref 0.0–0.2)

## 2021-02-07 MED ORDER — FENTANYL 25 MCG/HR TD PT72
1.0000 | MEDICATED_PATCH | TRANSDERMAL | Status: DC
  Administered 2021-02-07: 1 via TRANSDERMAL
  Filled 2021-02-07: qty 1

## 2021-02-07 NOTE — Progress Notes (Signed)
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  Assessment & Plan:   Principal Problem:   Intractable nausea and vomiting Active Problems:   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HCC)   Dehydration   Pancytopenia (HCC)   GERD (gastroesophageal reflux disease)    #1 intractable nausea and vomiting refractory to outpatient Zofran and Compazine.   Continue scopolamine patch and Robinul and Zofran.   I have added Ativan.   Chlorhexidine mouthwash and gargle, PPI.   He had bowel movements with Dulcolax.     #2 pancytopenia secondary to recent chemo.  ANC low 600.  Follow-up CBC in a.m.  #3 tonsillar squamous cell carcinoma followed by Dr.Iruku patient has a PEG tube in place and is receiving nutrition through it.  He was doing bolus feedings at home.  He wants to and is interested in doing continuous feedings.  He is now at 40 cc an hour.  His goal is 70 cc an hour.  We will titrate up the tube feeds as tolerated.  #4 GERD continue Protonix  #5 anemia hemoglobin 8.4  he received a total of 4 units of packed RBCs during this admission. Nutrition Problem: Inadequate oral intake Etiology: cancer and cancer related treatments     Signs/Symptoms: other (comment) (pt with G-tube)   Estimated body mass index is 33.56 kg/m as calculated from the following:   Height as of this encounter: 5\' 10"  (1.778 m).   Weight as of this encounter: 106.1 kg.  DVT prophylaxis: SCD  code Status: Full code  family Communication: None at bedside  disposition Plan:  Status is: Inpatient    Dispo: The patient is from: Home              Anticipated d/c is to: Home              Patient currently is not medically stable to d/c.   Difficult to place patient No        Consultants:  Oncology  Procedures: None Antimicrobials: None Subjective: He denies any nausea or vomiting overnight Having bowel movements with stool softeners no abdominal pain Asking to increase fentanyl patch due to neck pain and throat pain he is aware the pain will not go away 100% WBC and ANC trending down unfortunately  Objective: Vitals:   02/06/21 0610 02/06/21 1300 02/06/21 2120 02/07/21 0418  BP: 102/71 (!) 91/57 103/74 93/60  Pulse: (!) 59 (!) 53 (!) 55   Resp: 18  18 18   Temp: 98.6 F (37 C) 98.5 F (36.9 C) 98.4 F (36.9 C) 98.3 F (36.8 C)  TempSrc: Oral Oral Oral Oral  SpO2: 96% 100% 97% 97%  Weight:      Height:       No intake or output data in the 24 hours ending 02/07/21 1431  Filed Weights   02/03/21 0500 02/04/21 0445 02/05/21 0417  Weight: 106 kg 106.2 kg 106.1 kg    Examination:  General exam: Appears calm and comfortable  Respiratory system: Clear to auscultation. Respiratory effort normal. Cardiovascular system: S1 & S2 heard, RRR. No JVD, murmurs, rubs, gallops or clicks. No pedal edema. Gastrointestinal system: Abdomen is nondistended, soft and nontender. No organomegaly or masses felt. Normal bowel sounds heard.peg in place  Central nervous system: Alert and oriented. No focal neurological  deficits. Extremities: Symmetric 5 x 5 power. Skin: No rashes, lesions or ulcers Psychiatry: Judgement and insight appear normal. Mood & affect appropriate.     Data Reviewed: I have personally reviewed following labs and imaging studies  CBC: Recent Labs  Lab 02/03/21 0509 02/03/21 1601 02/04/21 0450 02/05/21 0500 02/06/21 0500 02/07/21 0504  WBC 1.5* 2.0* 1.4* 1.3* 1.4* 1.1*  NEUTROABS 0.9*  --  0.9* 0.8* 0.9* 0.6*  HGB 7.1* 7.6* 7.1* 6.8* 8.4* 8.4*  HCT 19.6* 21.8* 20.4* 19.4* 24.4* 24.0*  MCV 92.5 91.6 92.7 93.3 92.1 93.0  PLT 98* 93* 90* 86* 85* 87*    Basic Metabolic Panel: Recent Labs  Lab 02/03/21 0509 02/04/21 0450  02/05/21 0500 02/06/21 0500  NA 134* 135 136 136  K 3.6 3.8 3.8 3.7  CL 102 102 106 104  CO2 27 27 27 27   GLUCOSE 115* 114* 94 101*  BUN 21* 20 21* 17  CREATININE 0.97 0.91 0.90 0.89  CALCIUM 8.4* 8.3* 8.2* 8.3*    GFR: Estimated Creatinine Clearance: 123.8 mL/min (by C-G formula based on SCr of 0.89 mg/dL). Liver Function Tests: Recent Labs  Lab 02/03/21 0509 02/04/21 0450 02/05/21 0500 02/06/21 0500  AST 20 16 15 17   ALT 42 32 26 23  ALKPHOS 69 61 59 59  BILITOT 1.4* 0.8 1.3* 1.4*  PROT 5.8* 5.3* 5.5* 5.4*  ALBUMIN 3.5 3.1* 3.2* 3.3*    No results for input(s): LIPASE, AMYLASE in the last 168 hours.  No results for input(s): AMMONIA in the last 168 hours. Coagulation Profile: No results for input(s): INR, PROTIME in the last 168 hours.  Cardiac Enzymes: No results for input(s): CKTOTAL, CKMB, CKMBINDEX, TROPONINI in the last 168 hours. BNP (last 3 results) No results for input(s): PROBNP in the last 8760 hours. HbA1C: No results for input(s): HGBA1C in the last 72 hours. CBG: No results for input(s): GLUCAP in the last 168 hours. Lipid Profile: No results for input(s): CHOL, HDL, LDLCALC, TRIG, CHOLHDL, LDLDIRECT in the last 72 hours. Thyroid Function Tests: No results for input(s): TSH, T4TOTAL, FREET4, T3FREE, THYROIDAB in the last 72 hours. Anemia Panel: No results for input(s): VITAMINB12, FOLATE, FERRITIN, TIBC, IRON, RETICCTPCT in the last 72 hours. Sepsis Labs: No results for input(s): PROCALCITON, LATICACIDVEN in the last 168 hours.   Recent Results (from the past 240 hour(s))  Resp Panel by RT-PCR (Flu A&B, Covid) Nasopharyngeal Swab     Status: None   Collection Time: 01/29/21  6:26 PM   Specimen: Nasopharyngeal Swab; Nasopharyngeal(NP) swabs in vial transport medium  Result Value Ref Range Status   SARS Coronavirus 2 by RT PCR NEGATIVE NEGATIVE Final    Comment: (NOTE) SARS-CoV-2 target nucleic acids are NOT DETECTED.  The SARS-CoV-2 RNA is  generally detectable in upper respiratory specimens during the acute phase of infection. The lowest concentration of SARS-CoV-2 viral copies this assay can detect is 138 copies/mL. A negative result does not preclude SARS-Cov-2 infection and should not be used as the sole basis for treatment or other patient management decisions. A negative result may occur with  improper specimen collection/handling, submission of specimen other than nasopharyngeal swab, presence of viral mutation(s) within the areas targeted by this assay, and inadequate number of viral copies(<138 copies/mL). A negative result must be combined with clinical observations, patient history, and epidemiological information. The expected result is Negative.  Fact Sheet for Patients:  EntrepreneurPulse.com.au  Fact Sheet for Healthcare Providers:  IncredibleEmployment.be  This test is no t  yet approved or cleared by the [place] and  has been authorized for detection and/or diagnosis of SARS-CoV-2 by FDA under an Emergency Use Authorization (EUA). This EUA will remain  in effect (meaning this test can be used) for the duration of the COVID-19 declaration under Section 564(b)(1) of the Act, 21 U.S.C.section 360bbb-3(b)(1), unless the authorization is terminated  or revoked sooner.       Influenza A by PCR NEGATIVE NEGATIVE Final   Influenza B by PCR NEGATIVE NEGATIVE Final    Comment: (NOTE) The Xpert Xpress SARS-CoV-2/FLU/RSV plus assay is intended as an aid in the diagnosis of influenza from Nasopharyngeal swab specimens and should not be used as a sole basis for treatment. Nasal washings and aspirates are unacceptable for Xpert Xpress SARS-CoV-2/FLU/RSV testing.  Fact Sheet for Patients: EntrepreneurPulse.com.au  Fact Sheet for Healthcare Providers: IncredibleEmployment.be  This test is not yet approved or cleared by the [place] FDA and has been authorized for detection and/or diagnosis of SARS-CoV-2 by FDA under an Emergency Use Authorization (EUA). This EUA will remain in effect (meaning this test can be used) for the duration of the COVID-19 declaration under Section 564(b)(1) of the Act, 21 U.S.C. section 360bbb-3(b)(1), unless the authorization is terminated or revoked.  Performed at [facility], [place] [address] [place], [place] [address]           Radiology Studies: No results found.      Scheduled Meds:  bisacodyl  10 mg Oral Daily   chlorhexidine  10 mL Mouth/Throat QID   Chlorhexidine Gluconate Cloth  6 each Topical Daily   feeding supplement (PROSource TF)  45 mL Per Tube BID   fentaNYL  1 patch Transdermal Q72H   free water  30 mL Per Tube Q4H   glycopyrrolate  1 mg Oral BID   loratadine  10 mg Per Tube Daily   metoCLOPramide  5 mg Per Tube QID   pantoprazole (PROTONIX) IV  40 mg Intravenous Q24H   scopolamine  1 patch Transdermal Q72H   sodium chloride flush  10-40 mL Intracatheter Q12H   Continuous Infusions:  sodium chloride 75 mL/hr at 02/06/21 2101   feeding supplement (OSMOLITE 1.5 CAL) 1,000 mL (02/05/21 1551)     LOS: 8 days    Time spent:40 MIN    Georgette Shell, MD 02/07/2021, 2:31 PM

## 2021-02-07 NOTE — Progress Notes (Signed)
RN increased tube feed to 40, patient tolerated tube feed at this rate for about 2 hours. Patient was then clamped to go to radiation. Patient returned from radiation and asked RN if he could wait to be unclamped from tube feed because he was feeling extremely nauseous. PRN antiemetic given. Patient still complains of extreme nausea and vomiting. Tube clamped at this time. Patient educated on the importance of being unclamped for tube feed so that he will receive nutrition.Patient verbalizes understanding and still wishes to have nausea subside before being unclamped. Tube feed clamped at this time.

## 2021-02-08 ENCOUNTER — Encounter: Payer: Managed Care, Other (non HMO) | Admitting: Nutrition

## 2021-02-08 ENCOUNTER — Ambulatory Visit: Payer: Managed Care, Other (non HMO)

## 2021-02-08 ENCOUNTER — Ambulatory Visit
Admission: RE | Admit: 2021-02-08 | Discharge: 2021-02-08 | Disposition: A | Discharge status: Home / Self Care | Payer: Managed Care, Other (non HMO) | Source: Ambulatory Visit | Attending: Radiation Oncology | Admitting: Radiation Oncology

## 2021-02-08 ENCOUNTER — Telehealth: Payer: Self-pay | Admitting: Hematology and Oncology

## 2021-02-08 DIAGNOSIS — R112 Nausea with vomiting, unspecified: Secondary | ICD-10-CM | POA: Diagnosis not present

## 2021-02-08 LAB — CBC WITH DIFFERENTIAL/PLATELET
Abs Immature Granulocytes: 0 10*3/uL (ref 0.00–0.07)
Basophils Absolute: 0 10*3/uL (ref 0.0–0.1)
Basophils Relative: 0 %
Eosinophils Absolute: 0 10*3/uL (ref 0.0–0.5)
Eosinophils Relative: 1 %
HCT: 23.5 % — ABNORMAL LOW (ref 39.0–52.0)
Hemoglobin: 8.2 g/dL — ABNORMAL LOW (ref 13.0–17.0)
Immature Granulocytes: 0 %
Lymphocytes Relative: 37 %
Lymphs Abs: 0.4 10*3/uL — ABNORMAL LOW (ref 0.7–4.0)
MCH: 32.4 pg (ref 26.0–34.0)
MCHC: 34.9 g/dL (ref 30.0–36.0)
MCV: 92.9 fL (ref 80.0–100.0)
Monocytes Absolute: 0.1 10*3/uL (ref 0.1–1.0)
Monocytes Relative: 9 %
Neutro Abs: 0.6 10*3/uL — ABNORMAL LOW (ref 1.7–7.7)
Neutrophils Relative %: 53 %
Platelets: 99 10*3/uL — ABNORMAL LOW (ref 150–400)
RBC: 2.53 MIL/uL — ABNORMAL LOW (ref 4.22–5.81)
RDW: 14.3 % (ref 11.5–15.5)
WBC: 1.1 10*3/uL — CL (ref 4.0–10.5)
nRBC: 0 % (ref 0.0–0.2)

## 2021-02-08 MED ORDER — HEPARIN SOD (PORK) LOCK FLUSH 100 UNIT/ML IV SOLN
500.0000 [IU] | Freq: Once | INTRAVENOUS | Status: AC
  Administered 2021-02-08: 500 [IU] via INTRAVENOUS
  Filled 2021-02-08: qty 5

## 2021-02-08 MED ORDER — GLYCOPYRROLATE 1 MG PO TABS: 1.0000 mg | ORAL_TABLET | Freq: Two times a day (BID) | ORAL | 0 refills | Status: DC | PRN

## 2021-02-08 MED ORDER — SCOPOLAMINE 1 MG/3DAYS TD PT72: 1.0000 | MEDICATED_PATCH | TRANSDERMAL | 12 refills | Status: DC

## 2021-02-08 MED ORDER — LIDOCAINE-PRILOCAINE 2.5-2.5 % EX CREA: 1.0000 "application " | TOPICAL_CREAM | CUTANEOUS | 0 refills | Status: DC | PRN

## 2021-02-08 MED ORDER — PROSOURCE TF PO LIQD
45.0000 mL | Freq: Two times a day (BID) | ORAL | 6 refills | Status: DC
Start: 2021-02-08 — End: 2021-02-17

## 2021-02-08 MED ORDER — LORAZEPAM 0.5 MG PO TABS: 0.5000 mg | ORAL_TABLET | Freq: Three times a day (TID) | ORAL | 0 refills | Status: DC | PRN

## 2021-02-08 MED ORDER — FENTANYL 25 MCG/HR TD PT72: 1.0000 | MEDICATED_PATCH | TRANSDERMAL | 0 refills | Status: DC

## 2021-02-08 MED ORDER — PANTOPRAZOLE SODIUM 40 MG PO PACK
40.0000 mg | PACK | Freq: Every day | ORAL | Status: DC
  Administered 2021-02-08: 40 mg
  Filled 2021-02-08: qty 20

## 2021-02-08 MED ORDER — OSMOLITE 1.5 CAL PO LIQD
1000.0000 mL | ORAL | 6 refills | Status: DC
Start: 2021-02-08 — End: 2021-02-17

## 2021-02-08 NOTE — Discharge Summary (Signed)
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   Discharge Diagnoses:  Principal Problem:   Intractable nausea and vomiting Active Problems:   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HCC)   Dehydration   Pancytopenia (HCC)   GERD (gastroesophageal reflux disease)     #1 intractable nausea vomiting and dysphagia -refractory to outpatient treatments.  Patient was admitted to the hospital treated with IV fluids Zofran Compazine and scopolamine patch Robinul Ativan PPI mouthwash.  He was using wall suction continuously as he continued to have dysphagia and radiation therapy has been continued during his hospital stay.  He had choking episodes choking from his saliva and mucus buildup which improved with suctioning.  Patient continues to have problems with dysphagia.  He was on bolus feeding prior to admission to hospital this was changed to continuous feeding with the pump, he felt that this would improve his abdominal discomfort. He was also started on fentanyl patch for pain control during this hospital stay as he was unable to swallow MS Contin and Percocet.  #2 pancytopenia secondary to recent chemo.  ANC low 600.  He will follow-up with Dr. Stann Ore.   #3 tonsillar squamous cell carcinoma followed by Dr.Iruku patient has a PEG tube in place and is receiving nutrition through it.  He was doing  bolus feedings at home.  He wants to and is interested in doing continuous feedings.  He is aware to continue tube feeds to goal of 70 cc/h.    #4 GERD continue Protonix   #5 anemia hemoglobin 8.4  he received a total of 4 units of packed RBCs during this admission.  On the day of discharge his hemoglobin was 8.2 platelets were 99 white count was 1.1 with a ANC of 600. Nutrition Problem: Inadequate oral intake Etiology: cancer and cancer related treatments   Nutrition Problem: Inadequate oral intake Etiology: cancer and cancer related treatments    Signs/Symptoms: other (comment) (pt with G-tube)     Interventions: Tube feeding  Estimated body mass index is 32.9 kg/m as calculated from the following:   Height as of this encounter: 5\' 10"  (1.778 m).   Weight as of this encounter: 104 kg.  Discharge Instructions  Discharge Instructions     Diet - low sodium heart healthy   Complete by: As directed    For home use only DME Suction   Complete by: As directed    Oral yaunker   Suction: Other see comments   Increase activity slowly   Complete by: As directed       Allergies as of 02/08/2021   No Known Allergies      Medication List     STOP taking these medications    dexamethasone 4 MG tablet Commonly known as: DECADRON   lidocaine 2 % solution Commonly known as: XYLOCAINE   magic mouthwash w/lidocaine Soln   morphine 15 MG 12 hr tablet Commonly known as: MS CONTIN   oxyCODONE-acetaminophen 7.5-325 MG tablet Commonly known as: Percocet  TAKE these medications    feeding supplement (OSMOLITE 1.5 CAL) Liqd Place 1,000 mLs into feeding tube continuous. Pt switching from bolus feeds to continuous feeds; please qs for 30 days What changed:  how much to take how to take this when to take this additional instructions   feeding supplement (PROSource TF) liquid Place 45 mLs into feeding tube 2 (two) times daily. What changed: You were already  taking a medication with the same name, and this prescription was added. Make sure you understand how and when to take each.   fentaNYL 25 MCG/HR Commonly known as: [place] 1 patch onto the skin every 3 (three) days. Start taking on: February 10, 2021   glycopyrrolate 1 MG tablet Commonly known as: ROBINUL Take 1 tablet (1 mg total) by mouth every 12 (twelve) hours as needed.   lidocaine-prilocaine cream Commonly known as: EMLA Apply 1 application topically as needed. What changed:  how much to take how to take this when to take this reasons to take this additional instructions   LORazepam 0.5 MG tablet Commonly known as: ATIVAN Take 1 tablet (0.5 mg total) by mouth every 8 (eight) hours as needed. What changed: reasons to take this   scopolamine 1 MG/3DAYS Commonly known as: TRANSDERM-SCOP Place 1 patch (1.5 mg total) onto the skin every 3 (three) days.       ASK your doctor about these medications    ondansetron 8 MG tablet Commonly known as: Zofran Take 1 tablet (8 mg total) by mouth 2 (two) times daily as needed. Start on the third day after cisplatin chemotherapy.   prochlorperazine 10 MG tablet Commonly known as: COMPAZINE Take 1 tablet (10 mg total) by mouth every 6 (six) hours as needed (Nausea or vomiting).               Durable Medical Equipment  (From admission, onward)           Start     Ordered   02/08/21 0000  For home use only DME Suction       Comments: Oral yaunker  Question:  Suction  Answer:  Other see comments   02/08/21 1103            No Known Allergies  Consultations: Dr Chryl Heck   Procedures/Studies: DG Chest Port 1 View  Result Date: 01/29/2021 CLINICAL DATA:  Dyspnea EXAM: PORTABLE CHEST 1 VIEW COMPARISON:  November 11, 2020 FINDINGS: Right chest wall Port-A-Cath with tip overlying the superior cavoatrial junction. The heart size and mediastinal contours are within normal limits. Both lungs are clear. The  visualized skeletal structures are unremarkable. IMPRESSION: No acute abnormality of the lungs in AP portable projection. Electronically Signed   By: Dahlia Bailiff MD   On: 01/29/2021 17:09   (Echo, Carotid, EGD, Colonoscopy, ERCP)    Subjective:  He is resting in bed he is anxious to go home he tolerated tube feeds overnight Discharge Exam: Vitals:   02/07/21 2140 02/08/21 0634  BP: 103/66 95/71  Pulse: 66 76  Resp: 16 18  Temp: 99.4 F (37.4 C) 98.7 F (37.1 C)  SpO2: 95% 98%   Vitals:   02/07/21 1543 02/07/21 2140 02/08/21 0634 02/08/21 0706  BP: (!) 93/50 103/66 95/71   Pulse: 61 66 76   Resp: 16 16 18    Temp: 99.4 F (37.4 C) 99.4 F (37.4 C) 98.7 F (37.1 C)   TempSrc: Oral Oral Oral   SpO2: 96% 95% 98%   Weight:  104 kg  Height:        General: Pt is alert, awake, not in acute distress Cardiovascular: RRR, S1/S2 +, no rubs, no gallops Respiratory: CTA bilaterally, no wheezing, no rhonchi Abdominal: Soft, NT, ND, bowel sounds + PEG in place Extremities: no edema, no cyanosis    The results of significant diagnostics from this hospitalization (including imaging, microbiology, ancillary and laboratory) are listed below for reference.     Microbiology: Recent Results (from the past 240 hour(s))  Resp Panel by RT-PCR (Flu A&B, Covid) Nasopharyngeal Swab     Status: None   Collection Time: 01/29/21  6:26 PM   Specimen: Nasopharyngeal Swab; Nasopharyngeal(NP) swabs in vial transport medium  Result Value Ref Range Status   SARS Coronavirus 2 by RT PCR NEGATIVE NEGATIVE Final    Comment: (NOTE) SARS-CoV-2 target nucleic acids are NOT DETECTED.  The SARS-CoV-2 RNA is generally detectable in upper respiratory specimens during the acute phase of infection. The lowest concentration of SARS-CoV-2 viral copies this assay can detect is 138 copies/mL. A negative result does not preclude SARS-Cov-2 infection and should not be used as the sole basis for treatment  or other patient management decisions. A negative result may occur with  improper specimen collection/handling, submission of specimen other than nasopharyngeal swab, presence of viral mutation(s) within the areas targeted by this assay, and inadequate number of viral copies(<138 copies/mL). A negative result must be combined with clinical observations, patient history, and epidemiological information. The expected result is Negative.  Fact Sheet for Patients:  EntrepreneurPulse.com.au  Fact Sheet for Healthcare Providers:  IncredibleEmployment.be  This test is no t yet approved or cleared by the [place] FDA and  has been authorized for detection and/or diagnosis of SARS-CoV-2 by FDA under an Emergency Use Authorization (EUA). This EUA will remain  in effect (meaning this test can be used) for the duration of the COVID-19 declaration under Section 564(b)(1) of the Act, 21 U.S.C.section 360bbb-3(b)(1), unless the authorization is terminated  or revoked sooner.       Influenza A by PCR NEGATIVE NEGATIVE Final   Influenza B by PCR NEGATIVE NEGATIVE Final    Comment: (NOTE) The Xpert Xpress SARS-CoV-2/FLU/RSV plus assay is intended as an aid in the diagnosis of influenza from Nasopharyngeal swab specimens and should not be used as a sole basis for treatment. Nasal washings and aspirates are unacceptable for Xpert Xpress SARS-CoV-2/FLU/RSV testing.  Fact Sheet for Patients: EntrepreneurPulse.com.au  Fact Sheet for Healthcare Providers: IncredibleEmployment.be  This test is not yet approved or cleared by the [place] FDA and has been authorized for detection and/or diagnosis of SARS-CoV-2 by FDA under an Emergency Use Authorization (EUA). This EUA will remain in effect (meaning this test can be used) for the duration of the COVID-19 declaration under Section 564(b)(1) of the Act, 21 U.S.C. section  360bbb-3(b)(1), unless the authorization is terminated or revoked.  Performed at [facility], [place] [address], [place], [place] [address]      Labs: BNP (last 3 results) No results for input(s): BNP in the last 8760 hours. Basic Metabolic Panel: Recent Labs  Lab 02/03/21 0509 02/04/21 0450 02/05/21 0500 02/06/21 0500  NA 134* 135 136 136  K 3.6 3.8 3.8 3.7  CL 102 102 106 104  CO2 27 27 27 27   GLUCOSE 115* 114* 94 101*  BUN 21* 20 21* 17  CREATININE 0.97 0.91 0.90 0.89  CALCIUM 8.4* 8.3* 8.2* 8.3*   Liver Function Tests: Recent Labs  Lab 02/03/21 0509 02/04/21 0450 02/05/21 0500 02/06/21 0500  AST 20 16 15 17   ALT 42 32 26 23  ALKPHOS 69 61 59 59  BILITOT 1.4* 0.8 1.3* 1.4*  PROT 5.8* 5.3* 5.5* 5.4*  ALBUMIN 3.5 3.1* 3.2* 3.3*   No results for input(s): LIPASE, AMYLASE in the last 168 hours. No results for input(s): AMMONIA in the last 168 hours. CBC: Recent Labs  Lab 02/04/21 0450 02/05/21 0500 02/06/21 0500 02/07/21 0504 02/08/21 0506  WBC 1.4* 1.3* 1.4* 1.1* 1.1*  NEUTROABS 0.9* 0.8* 0.9* 0.6* 0.6*  HGB 7.1* 6.8* 8.4* 8.4* 8.2*  HCT 20.4* 19.4* 24.4* 24.0* 23.5*  MCV 92.7 93.3 92.1 93.0 92.9  PLT 90* 86* 85* 87* 99*   Cardiac Enzymes: No results for input(s): CKTOTAL, CKMB, CKMBINDEX, TROPONINI in the last 168 hours. BNP: Invalid input(s): POCBNP CBG: No results for input(s): GLUCAP in the last 168 hours. D-Dimer No results for input(s): DDIMER in the last 72 hours. Hgb A1c No results for input(s): HGBA1C in the last 72 hours. Lipid Profile No results for input(s): CHOL, HDL, LDLCALC, TRIG, CHOLHDL, LDLDIRECT in the last 72 hours. Thyroid function studies No results for input(s): TSH, T4TOTAL, T3FREE, THYROIDAB in the last 72 hours.  Invalid input(s): FREET3 Anemia work up No results for input(s): VITAMINB12, FOLATE, FERRITIN, TIBC, IRON, RETICCTPCT in the last 72 hours. Urinalysis    Component Value Date/Time   COLORURINE YELLOW  01/29/2021 1915   APPEARANCEUR CLEAR 01/29/2021 1915   LABSPEC 1.018 01/29/2021 1915   PHURINE 6.0 01/29/2021 1915   GLUCOSEU NEGATIVE 01/29/2021 1915   HGBUR NEGATIVE 01/29/2021 [place] 01/29/2021 [place] NEGATIVE 01/29/2021 1915   PROTEINUR NEGATIVE 01/29/2021 1915   NITRITE NEGATIVE 01/29/2021 1915   LEUKOCYTESUR NEGATIVE 01/29/2021 1915   Sepsis Labs Invalid input(s): PROCALCITONIN,  WBC,  LACTICIDVEN Microbiology Recent Results (from the past 240 hour(s))  Resp Panel by RT-PCR (Flu A&B, Covid) Nasopharyngeal Swab     Status: None   Collection Time: 01/29/21  6:26 PM   Specimen: Nasopharyngeal Swab; Nasopharyngeal(NP) swabs in vial transport medium  Result Value Ref Range Status   SARS Coronavirus 2 by RT PCR NEGATIVE NEGATIVE Final    Comment: (NOTE) SARS-CoV-2 target nucleic acids are NOT DETECTED.  The SARS-CoV-2 RNA is generally detectable in upper respiratory specimens during the acute phase of infection. The lowest concentration of SARS-CoV-2 viral copies this assay can detect is 138 copies/mL. A negative result does not preclude SARS-Cov-2 infection and should not be used as the sole basis for treatment or other patient management decisions. A negative result may occur with  improper specimen collection/handling, submission of specimen other than nasopharyngeal swab, presence of viral mutation(s) within the areas targeted by this assay, and inadequate number of viral copies(<138 copies/mL). A negative result must be combined with clinical observations, patient history, and epidemiological information. The expected result is Negative.  Fact Sheet for Patients:  EntrepreneurPulse.com.au  Fact Sheet for Healthcare Providers:  IncredibleEmployment.be  This test is no t yet approved or cleared by the [place] FDA and  has been authorized for detection and/or diagnosis of SARS-CoV-2 by FDA under an  Emergency Use Authorization (EUA). This EUA will remain  in effect (meaning this test can be used) for the duration of the COVID-19 declaration under Section 564(b)(1) of the Act, 21 U.S.C.section 360bbb-3(b)(1), unless the authorization is terminated  or revoked sooner.       Influenza A by PCR NEGATIVE  NEGATIVE Final   Influenza B by PCR NEGATIVE NEGATIVE Final    Comment: (NOTE) The Xpert Xpress SARS-CoV-2/FLU/RSV plus assay is intended as an aid in the diagnosis of influenza from Nasopharyngeal swab specimens and should not be used as a sole basis for treatment. Nasal washings and aspirates are unacceptable for Xpert Xpress SARS-CoV-2/FLU/RSV testing.  Fact Sheet for Patients: EntrepreneurPulse.com.au  Fact Sheet for Healthcare Providers: IncredibleEmployment.be  This test is not yet approved or cleared by the [place] FDA and has been authorized for detection and/or diagnosis of SARS-CoV-2 by FDA under an Emergency Use Authorization (EUA). This EUA will remain in effect (meaning this test can be used) for the duration of the COVID-19 declaration under Section 564(b)(1) of the Act, 21 U.S.C. section 360bbb-3(b)(1), unless the authorization is terminated or revoked.  Performed at [facility], [place] [address] [place], [place] [address]      Time coordinating discharge:  39 minutes  SIGNED:   Georgette Shell, MD  [facility] 02/08/2021, 12:23 PM

## 2021-02-08 NOTE — Telephone Encounter (Signed)
Scheduled appt per 7/19 sch msg. Called pt, no answer. Left msg with appt date and time.

## 2021-02-08 NOTE — TOC Progression Note (Signed)
Transition of Care [facility]) - Progression Note    Patient Details  Name: Cutberto Winfree MRN: [id number] Date of Birth: 15-Sep-1970  Transition of Care [facility]) CM/SW Contact  Purcell Mouton, RN Phone Number: 02/08/2021, 3:34 PM  Clinical Narrative:    Pt was called to setup Feeding Machine and supplies. Pt understood that supplies will be sent out overnight. Pt states that he will be okay.    Expected Discharge Plan: Home/Self Care Barriers to Discharge: Continued Medical Work up  Expected Discharge Plan and Services Expected Discharge Plan: Home/Self Care   Discharge Planning Services: CM Consult   Living arrangements for the past 2 months: Single Family Home Expected Discharge Date: 02/08/21                                     Social Determinants of Health (SDOH) Interventions    Readmission Risk Interventions Readmission Risk Prevention Plan 02/04/2021  Transportation Screening Complete  PCP or Specialist Appt within 3-5 Days Complete  HRI or [place] Complete  Social Work Consult for [place] Planning/Counseling Complete  Palliative Care Screening Not Applicable  Medication Review [employer]) Complete  Some recent data might be hidden

## 2021-02-09 ENCOUNTER — Other Ambulatory Visit: Payer: Self-pay | Admitting: Hematology and Oncology

## 2021-02-09 ENCOUNTER — Ambulatory Visit
Admission: RE | Admit: 2021-02-09 | Discharge: 2021-02-09 | Disposition: A | Discharge status: Home / Self Care | Payer: Managed Care, Other (non HMO) | Source: Ambulatory Visit | Attending: Radiation Oncology | Admitting: Radiation Oncology

## 2021-02-09 ENCOUNTER — Telehealth: Payer: Self-pay

## 2021-02-09 ENCOUNTER — Other Ambulatory Visit: Payer: Self-pay

## 2021-02-09 DIAGNOSIS — C109 Malignant neoplasm of oropharynx, unspecified: Secondary | ICD-10-CM | POA: Diagnosis present

## 2021-02-09 DIAGNOSIS — Z510 Encounter for antineoplastic radiation therapy: Secondary | ICD-10-CM | POA: Diagnosis not present

## 2021-02-09 MED ORDER — HYDROCODONE-ACETAMINOPHEN 7.5-325 MG/15ML PO SOLN: 10.0000 mL | Freq: Four times a day (QID) | ORAL | 0 refills | Status: DC | PRN

## 2021-02-09 NOTE — Telephone Encounter (Signed)
Received message from [facility] that patient was requesting liquid pain medication. Dr Chryl Heck sent liquid hydrocodone to patient's preferred pharmacy. Called patient and explained that, while taking liquid hydrocodone, he could not take oxycodone or MS contin. Patient verbalized understanding.

## 2021-02-10 ENCOUNTER — Ambulatory Visit
Admission: RE | Admit: 2021-02-10 | Discharge: 2021-02-10 | Disposition: A | Discharge status: Home / Self Care | Payer: Managed Care, Other (non HMO) | Source: Ambulatory Visit | Attending: Radiation Oncology | Admitting: Radiation Oncology

## 2021-02-10 ENCOUNTER — Ambulatory Visit: Payer: Managed Care, Other (non HMO) | Attending: Radiation Oncology

## 2021-02-10 DIAGNOSIS — L599 Disorder of the skin and subcutaneous tissue related to radiation, unspecified: Secondary | ICD-10-CM | POA: Diagnosis present

## 2021-02-10 DIAGNOSIS — R600 Localized edema: Secondary | ICD-10-CM | POA: Insufficient documentation

## 2021-02-10 DIAGNOSIS — R1310 Dysphagia, unspecified: Secondary | ICD-10-CM | POA: Insufficient documentation

## 2021-02-10 DIAGNOSIS — R293 Abnormal posture: Secondary | ICD-10-CM | POA: Insufficient documentation

## 2021-02-10 DIAGNOSIS — Z510 Encounter for antineoplastic radiation therapy: Secondary | ICD-10-CM | POA: Diagnosis not present

## 2021-02-10 DIAGNOSIS — C090 Malignant neoplasm of tonsillar fossa: Secondary | ICD-10-CM | POA: Insufficient documentation

## 2021-02-10 NOTE — Patient Instructions (Signed)
SWALLOWING EXERCISES  add these until you can routinely do the ones I provided initially Do these until 6 months after your last day of radiation, then 2-3 times per week afterwards   Pitch Raise - Repeat "he", once per second in as high of a pitch as you can - Repeat 20 times, 2-3 times a day  Chin pushback - Open your mouth  - Place your fist UNDER your chin near your neck - Tuck your chin and push back with your fist for 5 seconds - Repeat 10 times, 2-3 times a day

## 2021-02-10 NOTE — Therapy (Signed)
[facility] [address] [place], [place], [address] Phone: [phone]   Fax:  [phone]  Speech Language Pathology Treatment  Patient Details  Name: Gerald [name] MRN: [id number] Date of Birth: 1970/09/24 Referring Provider (SLP): Eppie Gibson, MD   Encounter Date: 02/10/2021   End of Session - 02/10/21 1431     Visit Number 2    Number of Visits 4    Date for SLP Re-Evaluation 03/30/21    SLP Start Time 63    SLP Stop Time  1425    SLP Time Calculation (min) 23 min    Activity Tolerance Patient tolerated treatment well             Past Medical History:  Diagnosis Date   Cancer ([place])    Sleep apnea    Tonsillar mass 11/16/2020    Past Surgical History:  Procedure Laterality Date   DIRECT LARYNGOSCOPY N/A 11/22/2020   Procedure: DIRECT LARYNGOSCOPY WITH BIOPSY;  Surgeon: Leta Baptist, MD;  Location: [place];  Service: ENT;  Laterality: N/A;   FOOT SURGERY  2000   extra nivicular removal   IR GASTROSTOMY TUBE MOD SED  12/24/2020   IR IMAGING GUIDED PORT INSERTION  12/24/2020   SPINE SURGERY     TONSILLECTOMY Left 11/22/2020   Procedure: LEFT TONSILLECTOMY;  Surgeon: Leta Baptist, MD;  Location: [place];  Service: ENT;  Laterality: Left;    There were no vitals filed for this visit.   Subjective Assessment - 02/10/21 1418     Subjective "This is the sickest I've ever been."    Currently in Pain? Yes    Pain Score 4     Pain Location Throat                   ADULT SLP TREATMENT - 02/10/21 1422       General Information   Behavior/Cognition Alert;Cooperative    HPI pt just d/c'd from inpatient stay in the last 7 days.      Treatment Provided   Treatment provided Dysphagia      Dysphagia Treatment   Temperature Spikes Noted No    Respiratory Status Room air    Oral Cavity - Dentition Adequate natural dentition    Patient observed directly with PO's No    pt politely refused POs today due to nausea/danger of vomiting   Other treatment/comments Pt politely refused to perform any swallowing or non-swallowing exercises due to danger of dry heaves/vomiting. "IT's all I can do right now just to keep my meds down." In light of this SLP demonstrated pt's exercises to him and pt carefully observed SLP. SLP added two non-swallowing exericses so pt could perform these PRN - pitch raise and chin-fist resistance (CTAR). SLP told pt to perform non-swallowing exercises as he feels he is able to do so, then add in swallowing exercises after that as he is able. He voiced understanding. Pt told SLP rationale for HEP indpendently.      Assessment / Recommendations / Plan   Plan Continue with current plan of care      Dysphagia Recommendations   Diet recommendations --   as tolerated   Medication Administration --   as tolerated     Progression Toward Goals   Progression toward goals Progressing toward goals              SLP Education - 02/10/21 1430     Education Details how  to perform pitch raise, and chin-fist (CTAR), adn other HEP exercises    Person(s) Educated Patient    Methods Explanation;Demonstration;Handout    Comprehension Verbalized understanding              SLP Short Term Goals - 02/10/21 1432       SLP SHORT TERM GOAL #1   Title pt will tell SLP why pt is completing HEP with modified independence    Period --   visits, for all STGs   Status Achieved      SLP SHORT TERM GOAL #2   Title pt will complete HEP with rare min A in 2 sessions    Time 1    Status On-going      SLP SHORT TERM GOAL #3   Title pt will describe 3 overt s/s aspiration PNA with modified independence    Time 2    Status On-going      SLP SHORT TERM GOAL #4   Title pt will tell SLP how a food journal could hasten return to a more normalized diet    Time 2    Status On-going              SLP Long Term Goals - 02/10/21 1433       SLP LONG  TERM GOAL #1   Title pt will complete HEP with modified independence over tree visits    Time 3    Period --   visits, for all LTGs   Status On-going      SLP LONG TERM GOAL #2   Title pt will complete HEP with independence over two visits    Time 4    Status On-going      SLP LONG TERM GOAL #3   Title pt will describe how to modify HEP over time, and the timeline associated with reduction in HEP frequency with modified independence over two sessions    Time 5    Status On-going              Plan - 02/10/21 1431     Clinical Impression Statement Pt reports severe nausea/vomiting recently. SLP reveiewed pt's individualized HEP for dysphagia however pt did not complete due to n/v. There are no overt s/s aspiration PNA observed, and none reported by pt at this time. Data indicate that pt's swallow ability will likely decrease over the course of radiation therapy and could very well decline over time following conclusion of their radiation therapy due to muscle disuse atrophy and/or muscle fibrosis. Pt will cont to need to be seen by SLP in order to assess safety of PO intake, assess the need for recommending any objective swallow assessment, and ensuring pt correctly completes the individualized HEP.    Speech Therapy Frequency --   once approx every 4 weeks   Duration --   90 days/12 weeks   Treatment/Interventions Aspiration precaution training;Pharyngeal strengthening exercises;Diet toleration management by SLP;Trials of upgraded texture/liquids;Internal/external aids;Patient/family education;SLP instruction and feedback;Compensatory strategies    Potential to Achieve Goals Good    SLP Home Exercise Plan provided today    Consulted and Agree with Plan of Care Patient             Patient will benefit from skilled therapeutic intervention in order to improve the following deficits and impairments:   Dysphagia, unspecified type    Problem List Patient Active Problem List    Diagnosis Date Noted   Intractable nausea and vomiting 01/29/2021  Dehydration 01/29/2021   Pancytopenia ([place]) 01/29/2021   GERD (gastroesophageal reflux disease) 01/29/2021   Weight loss, unintentional 01/04/2021   Tinnitus 01/04/2021   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 ,[place], [place]  02/10/2021, 2:36 PM  [facility] [address] [place], [place], [address] Phone: [phone]   Fax:  [phone]   Name: Gerald [name] MRN: [id number] Date of Birth: 1971-01-17

## 2021-02-11 ENCOUNTER — Encounter (HOSPITAL_COMMUNITY): Payer: Self-pay

## 2021-02-11 ENCOUNTER — Other Ambulatory Visit: Payer: Self-pay

## 2021-02-11 ENCOUNTER — Ambulatory Visit
Admission: RE | Admit: 2021-02-11 | Discharge: 2021-02-11 | Disposition: A | Discharge status: Home / Self Care | Payer: Managed Care, Other (non HMO) | Source: Ambulatory Visit | Attending: Radiation Oncology | Admitting: Radiation Oncology

## 2021-02-11 ENCOUNTER — Encounter: Payer: Self-pay | Admitting: Radiation Oncology

## 2021-02-11 ENCOUNTER — Observation Stay (HOSPITAL_COMMUNITY)
Admission: EM | Admit: 2021-02-11 | Discharge: 2021-02-11 | Disposition: A | Discharge status: Home / Self Care | Payer: Managed Care, Other (non HMO) | Attending: Internal Medicine | Admitting: Internal Medicine

## 2021-02-11 DIAGNOSIS — Z85818 Personal history of malignant neoplasm of other sites of lip, oral cavity, and pharynx: Secondary | ICD-10-CM | POA: Insufficient documentation

## 2021-02-11 DIAGNOSIS — D61818 Other pancytopenia: Secondary | ICD-10-CM | POA: Insufficient documentation

## 2021-02-11 DIAGNOSIS — Z20822 Contact with and (suspected) exposure to covid-19: Secondary | ICD-10-CM | POA: Diagnosis not present

## 2021-02-11 DIAGNOSIS — Z79899 Other long term (current) drug therapy: Secondary | ICD-10-CM | POA: Insufficient documentation

## 2021-02-11 DIAGNOSIS — Z85841 Personal history of malignant neoplasm of brain: Secondary | ICD-10-CM | POA: Insufficient documentation

## 2021-02-11 DIAGNOSIS — R1110 Vomiting, unspecified: Secondary | ICD-10-CM | POA: Diagnosis present

## 2021-02-11 DIAGNOSIS — R112 Nausea with vomiting, unspecified: Secondary | ICD-10-CM | POA: Diagnosis not present

## 2021-02-11 DIAGNOSIS — Z8579 Personal history of other malignant neoplasms of lymphoid, hematopoietic and related tissues: Secondary | ICD-10-CM | POA: Diagnosis not present

## 2021-02-11 DIAGNOSIS — C099 Malignant neoplasm of tonsil, unspecified: Secondary | ICD-10-CM

## 2021-02-11 LAB — URINALYSIS, ROUTINE W REFLEX MICROSCOPIC
Bacteria, UA: NONE SEEN
Bilirubin Urine: NEGATIVE
Glucose, UA: NEGATIVE mg/dL
Hgb urine dipstick: NEGATIVE
Ketones, ur: NEGATIVE mg/dL
Leukocytes,Ua: NEGATIVE
Nitrite: NEGATIVE
Protein, ur: 30 mg/dL — AB
Specific Gravity, Urine: 1.026 (ref 1.005–1.030)
pH: 5 (ref 5.0–8.0)

## 2021-02-11 LAB — COMPREHENSIVE METABOLIC PANEL
ALT: 23 U/L (ref 0–44)
AST: 17 U/L (ref 15–41)
Albumin: 4.3 g/dL (ref 3.5–5.0)
Alkaline Phosphatase: 81 U/L (ref 38–126)
Anion gap: 10 (ref 5–15)
BUN: 17 mg/dL (ref 6–20)
CO2: 28 mmol/L (ref 22–32)
Calcium: 9.6 mg/dL (ref 8.9–10.3)
Chloride: 100 mmol/L (ref 98–111)
Creatinine, Ser: 1.04 mg/dL (ref 0.61–1.24)
GFR, Estimated: >60 mL/min (ref >60)
Glucose, Bld: 115 mg/dL — ABNORMAL HIGH (ref 70–99)
Potassium: 4.7 mmol/L (ref 3.5–5.1)
Sodium: 138 mmol/L (ref 135–145)
Total Bilirubin: 1.1 mg/dL (ref 0.3–1.2)
Total Protein: 7 g/dL (ref 6.5–8.1)

## 2021-02-11 LAB — RESP PANEL BY RT-PCR (FLU A&B, COVID) ARPGX2
Influenza A by PCR: NEGATIVE
Influenza B by PCR: NEGATIVE
SARS Coronavirus 2 by RT PCR: NEGATIVE

## 2021-02-11 LAB — CBC
HCT: 27.1 % — ABNORMAL LOW (ref 39.0–52.0)
Hemoglobin: 9.2 g/dL — ABNORMAL LOW (ref 13.0–17.0)
MCH: 32.1 pg (ref 26.0–34.0)
MCHC: 33.9 g/dL (ref 30.0–36.0)
MCV: 94.4 fL (ref 80.0–100.0)
Platelets: 170 10*3/uL (ref 150–400)
RBC: 2.87 MIL/uL — ABNORMAL LOW (ref 4.22–5.81)
RDW: 16.6 % — ABNORMAL HIGH (ref 11.5–15.5)
WBC: 1.6 10*3/uL — ABNORMAL LOW (ref 4.0–10.5)
nRBC: 0 % (ref 0.0–0.2)

## 2021-02-11 LAB — LIPASE, BLOOD: Lipase: 38 U/L (ref 11–51)

## 2021-02-11 MED ORDER — LORAZEPAM 2 MG/ML IJ SOLN
1.0000 mg | Freq: Once | INTRAMUSCULAR | Status: AC
  Administered 2021-02-11: 1 mg via INTRAVENOUS
  Filled 2021-02-11: qty 1

## 2021-02-11 MED ORDER — PROMETHAZINE HCL 25 MG RE SUPP
25.0000 mg | Freq: Four times a day (QID) | RECTAL | Status: DC | PRN
  Administered 2021-02-11: 25 mg via RECTAL
  Filled 2021-02-11 (×2): qty 1

## 2021-02-11 MED ORDER — PANTOPRAZOLE SODIUM 40 MG IV SOLR
40.0000 mg | Freq: Once | INTRAVENOUS | Status: AC
  Administered 2021-02-11: 40 mg via INTRAVENOUS
  Filled 2021-02-11: qty 40

## 2021-02-11 MED ORDER — LORAZEPAM 2 MG/ML IJ SOLN
1.0000 mg | Freq: Four times a day (QID) | INTRAMUSCULAR | Status: DC | PRN
  Administered 2021-02-11: 1 mg via INTRAVENOUS
  Filled 2021-02-11: qty 1

## 2021-02-11 MED ORDER — ONDANSETRON HCL 4 MG/2ML IJ SOLN
4.0000 mg | Freq: Once | INTRAMUSCULAR | Status: AC
  Administered 2021-02-11: 4 mg via INTRAVENOUS
  Filled 2021-02-11: qty 2

## 2021-02-11 MED ORDER — SODIUM CHLORIDE 0.9 % IV BOLUS
1000.0000 mL | Freq: Once | INTRAVENOUS | Status: AC
  Administered 2021-02-11: 1000 mL via INTRAVENOUS

## 2021-02-11 MED ORDER — PROCHLORPERAZINE EDISYLATE 10 MG/2ML IJ SOLN
10.0000 mg | Freq: Four times a day (QID) | INTRAMUSCULAR | Status: DC | PRN
  Administered 2021-02-11: 10 mg via INTRAVENOUS
  Filled 2021-02-11: qty 2

## 2021-02-11 NOTE — ED Provider Notes (Signed)
Emergency Medicine Provider Triage Evaluation Note  Gerald [name] , a 50 y.o. male  was evaluated in triage.  Pt complains of nausea vomiting.  Review of Systems  Positive: Nausea, vomit, blood tinge emesis Negative: Abd pain, fever, chills  Physical Exam  BP 117/82 (BP Location: Right Arm)   Pulse 94   Temp 99.3 F (37.4 C) (Oral)   Resp 18   Ht '5\' 10"'$  (1.778 m)   Wt 98.5 kg   SpO2 99%   BMI 31.17 kg/m  Gen:   Awake, no distress   Resp:  Normal effort  MSK:   Moves extremities without difficulty  Other:  Abdomen soft nontender, G-tube in appropriate placement without signs of skin infection.  Medical Decision Making  Medically screening exam initiated at 9:55 AM.  Appropriate orders placed.  Gerald [name] was informed that the remainder of the evaluation will be completed by another provider, this initial triage assessment does not replace that evaluation, and the importance of remaining in the ED until their evaluation is complete.  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  Symptoms seems to be ongoing for the past 2 days.  Previously admitted to the hospital for intractable nausea and vomiting and was discharged 4 days ago.   Gerald Moras, PA-C 02/11/21 0957    Gerald Rasmussen, MD 02/11/21 Drema Halon

## 2021-02-11 NOTE — ED Provider Notes (Signed)
[place] DEPT Provider Note   CSN: [id number] Arrival date & time: 02/11/21  [id number]     History Chief Complaint  Patient presents with   Emesis   Cancer patient    Gerald [name] is a 50 y.o. male.  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  He was admitted for 2 weeks for intractable nausea and vomiting and just got discharged 3 days ago.  He said the Zofran and Ativan were working until yesterday when he started vomiting again.  Unable to keep his medications down.  No fever, abdominal pain.  Is doing tube feeds and is getting diarrhea.  He questions whether he could be allergic to tube feeds and that is what is making him vomit.  There is been some blood in his vomitus but he thinks it is from his tonsillar cancer.  The history is provided by the patient.  Emesis Severity:  Severe Duration:  2 days Timing:  Intermittent Progression:  Unchanged Chronicity:  Recurrent Relieved by:  Nothing Ineffective treatments:  Antiemetics Associated symptoms: diarrhea and sore throat   Associated symptoms: no abdominal pain, no chills, no cough, no fever and no headaches       Past Medical History:  Diagnosis Date   Cancer (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   Neoplasm related pain 12/06/2020   Impotence due to erectile dysfunction 08/30/2012   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 No pertinent family history.  Social History   Tobacco Use   Smoking status: Never   Smokeless tobacco: Never  Vaping Use   Vaping Use: Never used  Substance Use Topics   Alcohol use: Not Currently    Comment: rare   Drug use: Never    Home Medications Prior to Admission medications   Medication Sig Start Date End Date Taking? Authorizing Provider  fentaNYL (DURAGESIC) 25 MCG/HR Place 1 patch onto the skin every 3 (three) days. 02/10/21   Georgette Shell, MD  glycopyrrolate (ROBINUL) 1 MG tablet Take 1 tablet (1 mg total) by mouth every 12 (twelve) hours as needed. 02/08/21   Georgette Shell, MD  HYDROcodone-acetaminophen (HYCET) 7.5-325 mg/15 ml solution Take 10 mLs by mouth every 6 (six) hours as needed for moderate pain or severe pain. 02/09/21   Benay Pike, MD  lidocaine-prilocaine (  EMLA) cream Apply 1 application topically as needed. 02/08/21   Georgette Shell, MD  LORazepam (ATIVAN) 0.5 MG tablet Take 1 tablet (0.5 mg total) by mouth every 8 (eight) hours as needed. 02/08/21   Georgette Shell, MD  Nutritional Supplements (FEEDING SUPPLEMENT, OSMOLITE 1.5 CAL,) LIQD Place 1,000 mLs into feeding tube continuous. Pt switching from bolus feeds to continuous feeds; please qs for 30 days 02/08/21   Georgette Shell, MD  Nutritional Supplements (FEEDING SUPPLEMENT, PROSOURCE TF,) liquid Place 45 mLs into feeding tube 2 (two) times  daily. 02/08/21   Georgette Shell, MD  ondansetron (ZOFRAN) 8 MG tablet Take 1 tablet (8 mg total) by mouth 2 (two) times daily as needed. Start on the third day after cisplatin chemotherapy. Patient taking differently: Take 8 mg by mouth 2 (two) times daily as needed for nausea or vomiting. Start on the third day after cisplatin chemotherapy. 12/22/20   Benay Pike, MD  prochlorperazine (COMPAZINE) 10 MG tablet Take 1 tablet (10 mg total) by mouth every 6 (six) hours as needed (Nausea or vomiting). Patient taking differently: Take 10 mg by mouth every 6 (six) hours as needed for nausea or vomiting (Nausea or vomiting). 12/22/20   Benay Pike, MD  scopolamine (TRANSDERM-SCOP) 1 MG/3DAYS Place 1 patch (1.5 mg total) onto the skin every 3 (three) days. 02/08/21   Georgette Shell, MD  fluticasone [facility]) 50 MCG/ACT nasal spray Place 2 sprays into both nostrils daily. 06/06/13 11/11/20  Roselee Culver, MD  sildenafil (VIAGRA) 100 MG tablet Take 1 tablet (100 mg total) by mouth daily as needed. 08/30/12 11/11/20  Mancel Bale, PA-C    Allergies    Patient has no known allergies.  Review of Systems   Review of Systems  Constitutional:  Negative for chills and fever.  HENT:  Positive for sore throat.   Eyes:  Negative for visual disturbance.  Respiratory:  Negative for cough and shortness of breath.   Cardiovascular:  Negative for chest pain.  Gastrointestinal:  Positive for diarrhea, nausea and vomiting. Negative for abdominal pain.  Genitourinary:  Negative for dysuria.  Musculoskeletal:  Negative for back pain.  Skin:  Negative for rash.  Neurological:  Negative for headaches.   Physical Exam Updated Vital Signs BP 108/76 (BP Location: Right Arm)   Pulse 78   Temp 99.3 F (37.4 C) (Oral)   Resp 16   Ht '5\' 10"'$  (1.778 m)   Wt 98.5 kg   SpO2 99%   BMI 31.17 kg/m   Physical Exam Vitals and nursing note reviewed.  Constitutional:      Appearance: Normal appearance.  He is well-developed.  HENT:     Head: Normocephalic and atraumatic.  Eyes:     Conjunctiva/sclera: Conjunctivae normal.  Cardiovascular:     Rate and Rhythm: Normal rate and regular rhythm.     Heart sounds: No murmur heard. Pulmonary:     Effort: Pulmonary effort is normal. No respiratory distress.     Breath sounds: Normal breath sounds.  Abdominal:     Palpations: Abdomen is soft.     Tenderness: There is no abdominal tenderness.  Musculoskeletal:        General: Normal range of motion.     Cervical back: Neck supple.     Right lower leg: No edema.     Left lower leg: No edema.  Skin:    General: Skin is warm and dry.  Neurological:     General: No  focal deficit present.     Mental Status: He is alert.    ED Results / Procedures / Treatments   Labs (all labs ordered are listed, but only abnormal results are displayed) Labs Reviewed  COMPREHENSIVE METABOLIC PANEL - Abnormal; Notable for the following components:      Result Value   Glucose, Bld 115 (*)    All other components within normal limits  CBC - Abnormal; Notable for the following components:   WBC 1.6 (*)    RBC 2.87 (*)    Hemoglobin 9.2 (*)    HCT 27.1 (*)    RDW 16.6 (*)    All other components within normal limits  URINALYSIS, ROUTINE W REFLEX MICROSCOPIC - Abnormal; Notable for the following components:   Color, Urine AMBER (*)    APPearance HAZY (*)    Protein, ur 30 (*)    All other components within normal limits  RESP PANEL BY RT-PCR (FLU A&B, COVID) ARPGX2  LIPASE, BLOOD  CBC WITH DIFFERENTIAL/PLATELET    EKG None  Radiology No results found.  Procedures Procedures   Medications Ordered in ED Medications  promethazine (PHENERGAN) suppository 25 mg (25 mg Rectal Given 02/11/21 1039)  prochlorperazine (COMPAZINE) injection 10 mg (10 mg Intravenous Given 02/11/21 1833)  LORazepam (ATIVAN) injection 1 mg (1 mg Intravenous Given 02/11/21 1830)  ondansetron (ZOFRAN) injection 4 mg (4 mg  Intravenous Given 02/11/21 1300)  sodium chloride 0.9 % bolus 1,000 mL (0 mLs Intravenous Stopped 02/11/21 1501)  pantoprazole (PROTONIX) injection 40 mg (40 mg Intravenous Given 02/11/21 1425)  LORazepam (ATIVAN) injection 1 mg (1 mg Intravenous Given 02/11/21 1425)    ED Course  I have reviewed the triage vital signs and the nursing notes.  Pertinent labs & imaging results that were available during my care of the patient were reviewed by me and considered in my medical decision making (see chart for details).  Clinical Course as of 02/11/21 1900  Fri Feb 11, 2021  1310 I secure messaged Dr. Lanell Persons from radiation oncology who saw the patient earlier today.  She felt that he would likely need to be admitted to the hospital unless he feels fantastic after some symptomatic treatment in the emergency department. [MB]  1416 Discussed with Dr. Marylyn Ishihara from [facility] hospitalist who will evaluate the patient for admission. [MB]    Clinical Course User Index [MB] Hayden Rasmussen, MD   MDM Rules/Calculators/A&P                          This patient complains of nausea and vomiting in the setting of tonsillar cancer radiation chemotherapy; this involves an extensive number of treatment Options and is a complaint that carries with it a high risk of complications and Morbidity. The differential includes intractable nausea and vomiting, dehydration, metabolic derangement, obstruction, GI bleed  I ordered, reviewed and interpreted labs, which included CBC with low white count similar to priors, hemoglobin better than priors, chemistries and LFTs unremarkable, urinalysis without obvious signs of infection, COVID and flu testing negative I ordered medication IV fluids Zofran and Ativan with minimal change in patient's symptoms. Additional history obtained from patient's radiology oncologist Dr. Lanell Persons Previous records obtained and reviewed in epic including prior admission and discharge summaries I  consulted Dr. Marylyn Ishihara [facility] hospital[facility] and discussed lab and imaging findings  Critical Interventions: None  After the interventions stated above, I reevaluated the patient and found patient still symptomatic and would benefit from  continued IV hydration and management of his symptoms.   Final Clinical Impression(s) / ED Diagnoses Final diagnoses:  Intractable vomiting with nausea, unspecified vomiting type  Tonsillar cancer [facility])    Rx / DC Orders ED Discharge Orders     None        Hayden Rasmussen, MD 02/11/21 1905

## 2021-02-11 NOTE — H&P (Signed)
History and Physical    Gerald [name] [id number] DOB: [birth year] DOA: 02/11/2021  PCP: Pcp, No  Patient coming from: Home  Chief Complaint: N/V  HPI: Gerald [name] is a 50 y.o. male with medical history significant of tonsillar cancer, dysphagia s/p PEG, chronic pain. Presenting with intractable N/V. Recent admission for same. He was discharged from the hospital 3 days ago. Since being home, his N/V has returned. Ativan and zofran have not helped. He went for XRT today and after the session, he felt too sick to go home. He came to the ED for assistance. He denies any other aggravating or alleviating factors.   ED Course: Patient was found to be nauseous. He was given protonix, zofran and ativan with only minimal improvement. TRH was called for admission.   Review of Systems:  Review of systems is otherwise negative for all not mentioned in HPI.   PMHx Past Medical History:  Diagnosis Date   Cancer [facility])    Sleep apnea    Tonsillar mass 11/16/2020    PSHx Past Surgical History:  Procedure Laterality Date   DIRECT LARYNGOSCOPY N/A 11/22/2020   Procedure: DIRECT LARYNGOSCOPY WITH BIOPSY;  Surgeon: Leta Baptist, MD;  Location: [place];  Service: ENT;  Laterality: N/A;   FOOT SURGERY  2000   extra nivicular removal   IR GASTROSTOMY TUBE MOD SED  12/24/2020   IR IMAGING GUIDED PORT INSERTION  12/24/2020   SPINE SURGERY     TONSILLECTOMY Left 11/22/2020   Procedure: LEFT TONSILLECTOMY;  Surgeon: Leta Baptist, MD;  Location: [place];  Service: ENT;  Laterality: Left;    SocHx  reports that he has never smoked. He has never used smokeless tobacco. He reports previous alcohol use. He reports that he does not use drugs.  No Known Allergies  FamHx History reviewed. No pertinent family history.  Prior to Admission medications   Medication Sig Start Date End Date Taking? Authorizing Provider  fentaNYL (DURAGESIC) 25 MCG/HR Place 1 patch onto the skin every 3  (three) days. 02/10/21  Yes Georgette Shell, MD  glycopyrrolate (ROBINUL) 1 MG tablet Take 1 tablet (1 mg total) by mouth every 12 (twelve) hours as needed. Patient taking differently: Take 1 mg by mouth every 12 (twelve) hours as needed (stomach pain). 02/08/21  Yes Georgette Shell, MD  HYDROcodone-acetaminophen (HYCET) 7.5-325 mg/15 ml solution Take 10 mLs by mouth every 6 (six) hours as needed for moderate pain or severe pain. 02/09/21  Yes Iruku, Arletha Pili, MD  LORazepam (ATIVAN) 0.5 MG tablet Take 1 tablet (0.5 mg total) by mouth every 8 (eight) hours as needed. Patient taking differently: Take 0.5 mg by mouth every 8 (eight) hours as needed for anxiety or sleep. 02/08/21  Yes Georgette Shell, MD  Nutritional Supplements (FEEDING SUPPLEMENT, OSMOLITE 1.5 CAL,) LIQD Place 1,000 mLs into feeding tube continuous. Pt switching from bolus feeds to continuous feeds; please qs for 30 days 02/08/21  Yes Georgette Shell, MD  Nutritional Supplements (FEEDING SUPPLEMENT, PROSOURCE TF,) liquid Place 45 mLs into feeding tube 2 (two) times daily. 02/08/21  Yes Georgette Shell, MD  ondansetron (ZOFRAN) 8 MG tablet Take 1 tablet (8 mg total) by mouth 2 (two) times daily as needed. Start on the third day after cisplatin chemotherapy. Patient taking differently: Take 8 mg by mouth 2 (two) times daily as needed for nausea or vomiting. Start on the third day after cisplatin chemotherapy. 12/22/20  Yes Benay Pike, MD  prochlorperazine (COMPAZINE) 10 MG tablet Take 1 tablet (10 mg total) by mouth every 6 (six) hours as needed (Nausea or vomiting). Patient taking differently: Take 10 mg by mouth every 6 (six) hours as needed for nausea or vomiting (Nausea or vomiting). 12/22/20  Yes Benay Pike, MD  scopolamine (TRANSDERM-SCOP) 1 MG/3DAYS Place 1 patch (1.5 mg total) onto the skin every 3 (three) days. 02/08/21  Yes Georgette Shell, MD  lidocaine-prilocaine (EMLA) cream Apply 1 application  topically as needed. Patient not taking: Reported on 02/11/2021 02/08/21   Georgette Shell, MD  fluticasone [facility]) 50 MCG/ACT nasal spray Place 2 sprays into both nostrils daily. 06/06/13 11/11/20  Roselee Culver, MD  sildenafil (VIAGRA) 100 MG tablet Take 1 tablet (100 mg total) by mouth daily as needed. 08/30/12 11/11/20  Mancel Bale, PA-C    Physical Exam: Vitals:   02/11/21 0916 02/11/21 1223 02/11/21 1316 02/11/21 1402  BP:  108/76 117/88 133/80  Pulse:  78 79 69  Resp:  '16 16 16  '$ Temp:      TempSrc:      SpO2:  99% 100% 100%  Weight: 98.5 kg     Height: '5\' 10"'$  (1.778 m)       General: 50 y.o. male resting in bed in NAD Eyes: PERRL, normal sclera ENMT: Nares patent w/o discharge, orophaynx clear, dentition normal, ears w/o discharge/lesions/ulcers Neck: Supple, trachea midline Cardiovascular: RRR, +S1, S2, no m/g/r, equal pulses throughout Respiratory: CTABL, no w/r/r, normal WOB GI: BS+, NDNT, no masses noted, no organomegaly noted, PEG noted MSK: No e/c/c Skin: No rashes, bruises, ulcerations noted Neuro: A&O x 3, no focal deficits Psyc: Appropriate interaction and affect, calm/cooperative  Labs on Admission: I have personally reviewed following labs and imaging studies  CBC: Recent Labs  Lab 02/05/21 0500 02/06/21 0500 02/07/21 0504 02/08/21 0506 02/11/21 0930  WBC 1.3* 1.4* 1.1* 1.1* 1.6*  NEUTROABS 0.8* 0.9* 0.6* 0.6*  --   HGB 6.8* 8.4* 8.4* 8.2* 9.2*  HCT 19.4* 24.4* 24.0* 23.5* 27.1*  MCV 93.3 92.1 93.0 92.9 94.4  PLT 86* 85* 87* 99* [id number]   Basic Metabolic Panel: Recent Labs  Lab 02/05/21 0500 02/06/21 0500 02/11/21 0930  NA 136 136 138  K 3.8 3.7 4.7  CL 106 104 100  CO2 '27 27 28  '$ GLUCOSE 94 101* 115*  BUN 21* 17 17  CREATININE 0.90 0.89 1.04  CALCIUM 8.2* 8.3* 9.6   GFR: Estimated Creatinine Clearance: 102.2 mL/min (by C-G formula based on SCr of 1.04 mg/dL). Liver Function Tests: Recent Labs  Lab 02/05/21 0500 02/06/21 0500  02/11/21 0930  AST '15 17 17  '$ ALT '26 23 23  '$ ALKPHOS 59 59 81  BILITOT 1.3* 1.4* 1.1  PROT 5.5* 5.4* 7.0  ALBUMIN 3.2* 3.3* 4.3   Recent Labs  Lab 02/11/21 0930  LIPASE 38   No results for input(s): AMMONIA in the last 168 hours. Coagulation Profile: No results for input(s): INR, PROTIME in the last 168 hours. Cardiac Enzymes: No results for input(s): CKTOTAL, CKMB, CKMBINDEX, TROPONINI in the last 168 hours. BNP (last 3 results) No results for input(s): PROBNP in the last 8760 hours. HbA1C: No results for input(s): HGBA1C in the last 72 hours. CBG: No results for input(s): GLUCAP in the last 168 hours. Lipid Profile: No results for input(s): CHOL, HDL, LDLCALC, TRIG, CHOLHDL, LDLDIRECT in the last 72 hours. Thyroid Function Tests: No results for input(s): TSH, T4TOTAL, FREET4, T3FREE, THYROIDAB in the last 72  hours. Anemia Panel: No results for input(s): VITAMINB12, FOLATE, FERRITIN, TIBC, IRON, RETICCTPCT in the last 72 hours. Urine analysis:    Component Value Date/Time   COLORURINE AMBER (A) 02/11/2021 1101   APPEARANCEUR HAZY (A) 02/11/2021 1101   LABSPEC 1.026 02/11/2021 1101   PHURINE 5.0 02/11/2021 1101   GLUCOSEU NEGATIVE 02/11/2021 1101   HGBUR NEGATIVE 02/11/2021 1101   BILIRUBINUR NEGATIVE 02/11/2021 1101   KETONESUR NEGATIVE 02/11/2021 1101   PROTEINUR 30 (A) 02/11/2021 1101   NITRITE NEGATIVE 02/11/2021 1101   LEUKOCYTESUR NEGATIVE 02/11/2021 1101    Radiological Exams on Admission: No results found.  EKG: Independently reviewed. Sinus, no st elevations  Assessment/Plan Intractable N/V     - place in obs, med-surg     - IVF, compazine, ativan, protonix     - hold TF until nausea has improved  Hx of tonsilar CA s/p PEG placement     - continue TF when nausea improved     - continue outpt onco follow up  Pancytopenia     - likely secondary to radiation/chemo Tx     - no evidence of bleed, infection     - check diff and iron studies  DVT  prophylaxis: lovenox  Code Status: FULL  Family Communication: None at bedside  Consults called: Onco notified of patient admission   Status is: Observation  The patient remains OBS appropriate and will d/c before 2 midnights.  Dispo: The patient is from: Home              Anticipated d/c is to: Home              Patient currently is not medically stable to d/c.   Difficult to place patient No  Time spent coordinating admission: 70 minutes  [facility]  If 7PM-7AM, please contact night-coverage [email or web address]  02/11/2021, 2:36 PM

## 2021-02-11 NOTE — ED Notes (Signed)
Patient stated that he was tired of waiting here for a bed. RN explained that Agricultural consultant and Covering provider would need to be notified. RN asked patient if he could wait until I spoke with Agricultural consultant. Patient also has a port which was accessed. RN explained to patient that we would need to de-access his port prior to leaving. Patient said he would wait a few more minutes.  Floor Covering provider contacted [employer], manufacturing systems notified. RN walked back into room to have him sign AMA form and de-access port. Patient was already gone.when RN went back into room. Agricultural consultant notified and Covering provider [employer] also notified

## 2021-02-11 NOTE — Progress Notes (Signed)
Patient asked to be brought around after radiation this morning. Met with Dr. Isidore Moos and reports he felt too poorly to go home, and wanted to be admitted to hospital again. Spoke with [facility]-ED charge nurse, but no open bays available. Patient escorted over to ED lobby via wheelchair by [facility] Navigator   Vitals:   02/11/21 0800  BP: 108/82  Pulse: (!) 121  Resp: 19  Temp: (!) 97.1 F (36.2 C)  SpO2: 98%   Wt Readings from Last 3 Encounters:  02/11/21 217 lb 4 oz (98.5 kg)  02/08/21 229 lb 4.5 oz (104 kg)  01/25/21 228 lb 14.4 oz (103.8 kg)

## 2021-02-11 NOTE — ED Triage Notes (Addendum)
Patient reports that he has had continuous vomiting since being discharged from the hospital 3 days ago. Patient states he has streaks of blood in his vomitus.  Paitent also reports that his last chemo was 2 weeks ago. patient had a radiation treatment to his throat today.

## 2021-02-12 ENCOUNTER — Inpatient Hospital Stay: Payer: Managed Care, Other (non HMO)

## 2021-02-12 ENCOUNTER — Other Ambulatory Visit: Payer: Self-pay

## 2021-02-12 VITALS — BP 103/71 | HR 81 | Temp 96.5°F | Resp 16

## 2021-02-12 DIAGNOSIS — C109 Malignant neoplasm of oropharynx, unspecified: Secondary | ICD-10-CM

## 2021-02-12 DIAGNOSIS — Z510 Encounter for antineoplastic radiation therapy: Secondary | ICD-10-CM | POA: Diagnosis not present

## 2021-02-12 MED ORDER — HEPARIN SOD (PORK) LOCK FLUSH 100 UNIT/ML IV SOLN
500.0000 [IU] | Freq: Once | INTRAVENOUS | Status: AC
  Administered 2021-02-12: 500 [IU] via INTRAVENOUS
  Filled 2021-02-12: qty 5

## 2021-02-12 MED ORDER — ONDANSETRON HCL 4 MG/2ML IJ SOLN
INTRAMUSCULAR | Status: AC
  Filled 2021-02-12: qty 4

## 2021-02-12 MED ORDER — LORAZEPAM 2 MG/ML IJ SOLN
0.5000 mg | Freq: Once | INTRAMUSCULAR | Status: AC
  Administered 2021-02-12: 0.5 mg via INTRAVENOUS

## 2021-02-12 MED ORDER — SODIUM CHLORIDE 0.9 % IV SOLN
Freq: Once | INTRAVENOUS | Status: DC
  Filled 2021-02-12: qty 250

## 2021-02-12 MED ORDER — SODIUM CHLORIDE 0.9% FLUSH
10.0000 mL | Freq: Once | INTRAVENOUS | Status: AC
  Administered 2021-02-12: 10 mL via INTRAVENOUS
  Filled 2021-02-12: qty 10

## 2021-02-12 MED ORDER — LORAZEPAM 2 MG/ML IJ SOLN
INTRAMUSCULAR | Status: AC
  Filled 2021-02-12: qty 1

## 2021-02-12 MED ORDER — SODIUM CHLORIDE 0.9 % IV SOLN
Freq: Once | INTRAVENOUS | Status: AC
  Filled 2021-02-12: qty 250

## 2021-02-12 NOTE — Patient Instructions (Signed)

## 2021-02-12 NOTE — Progress Notes (Signed)
Towards end of hydration fluids, patient had small amount of emesis and complained of nausea. Verbal order given by Dr. Irene Limbo for 0.'5mg'$  Ativan.

## 2021-02-12 NOTE — Discharge Summary (Addendum)
[place] DISCHARGE  Admitted: 02/12/21 Left AMA: 02/12/21  HPI: Gerald [name] is a 50 y.o. male with medical history significant of tonsillar cancer, dysphagia s/p PEG, chronic pain. Presenting with intractable N/V. Recent admission for same. He was discharged from the hospital 3 days ago. Since being home, his N/V has returned. Ativan and zofran have not helped. He went for XRT today and after the session, he felt too sick to go home. He came to the ED for assistance. He denies any other aggravating or alleviating factors.  Interim Events: Per nursing, patient left AMA overnight. Reason: didn't want to wait for a bed. Recommend he follow up with PCP ASAP.  Discharge Dx: Intractable N/V Hx of tonsilar CA s/p PEG placement Pancytopenia   Gerald Finner, DO

## 2021-02-14 ENCOUNTER — Ambulatory Visit: Payer: Managed Care, Other (non HMO)

## 2021-02-14 NOTE — Progress Notes (Signed)
Oncology Nurse Navigator Documentation   Mr. Gerald [name] did not come for his scheduled radiation treatment today. I called and spoke to him and he verbalized to me that he did not plan to finish his scheduled radiation treatments due to the current symptoms that he is experiencing related to his treatment. He reports constant nausea/vomiting and an inability to keep nutrition down by mouth or via his PEG. He has agreed to see Dr. Isidore Moos after his scheduled visit with Dr. Chryl Heck tomorrow at 8:20. I have notified Dr. Chryl Heck, Dr. Isidore Moos, and [facility] RN of the above. He knows to call me if I can assist him in any way.  Harlow Asa RN, BSN, OCN Head & Neck Oncology Nurse [place] [facility] Phone # [phone]  Fax # [phone]

## 2021-02-15 ENCOUNTER — Telehealth: Payer: Self-pay | Admitting: Hematology and Oncology

## 2021-02-15 ENCOUNTER — Ambulatory Visit: Payer: Managed Care, Other (non HMO) | Admitting: Nutrition

## 2021-02-15 ENCOUNTER — Ambulatory Visit: Payer: Managed Care, Other (non HMO)

## 2021-02-15 ENCOUNTER — Inpatient Hospital Stay (HOSPITAL_BASED_OUTPATIENT_CLINIC_OR_DEPARTMENT_OTHER): Payer: Managed Care, Other (non HMO) | Admitting: Hematology and Oncology

## 2021-02-15 ENCOUNTER — Inpatient Hospital Stay: Payer: Managed Care, Other (non HMO)

## 2021-02-15 ENCOUNTER — Encounter: Payer: Self-pay | Admitting: Hematology and Oncology

## 2021-02-15 ENCOUNTER — Other Ambulatory Visit: Payer: Self-pay

## 2021-02-15 VITALS — BP 103/78 | HR 118 | Temp 98.9°F | Resp 18 | Ht 70.0 in | Wt 217.3 lb

## 2021-02-15 DIAGNOSIS — C109 Malignant neoplasm of oropharynx, unspecified: Secondary | ICD-10-CM

## 2021-02-15 DIAGNOSIS — R634 Abnormal weight loss: Secondary | ICD-10-CM | POA: Diagnosis not present

## 2021-02-15 DIAGNOSIS — R112 Nausea with vomiting, unspecified: Secondary | ICD-10-CM | POA: Diagnosis not present

## 2021-02-15 DIAGNOSIS — D61818 Other pancytopenia: Secondary | ICD-10-CM | POA: Diagnosis not present

## 2021-02-15 DIAGNOSIS — Z510 Encounter for antineoplastic radiation therapy: Secondary | ICD-10-CM | POA: Diagnosis not present

## 2021-02-15 DIAGNOSIS — G893 Neoplasm related pain (acute) (chronic): Secondary | ICD-10-CM

## 2021-02-15 LAB — CBC WITH DIFFERENTIAL/PLATELET
Abs Immature Granulocytes: 0.01 10*3/uL (ref 0.00–0.07)
Basophils Absolute: 0 10*3/uL (ref 0.0–0.1)
Basophils Relative: 0 %
Eosinophils Absolute: 0 10*3/uL (ref 0.0–0.5)
Eosinophils Relative: 1 %
HCT: 27.5 % — ABNORMAL LOW (ref 39.0–52.0)
Hemoglobin: 9.4 g/dL — ABNORMAL LOW (ref 13.0–17.0)
Immature Granulocytes: 0 %
Lymphocytes Relative: 21 %
Lymphs Abs: 0.5 10*3/uL — ABNORMAL LOW (ref 0.7–4.0)
MCH: 32.8 pg (ref 26.0–34.0)
MCHC: 34.2 g/dL (ref 30.0–36.0)
MCV: 95.8 fL (ref 80.0–100.0)
Monocytes Absolute: 0.3 10*3/uL (ref 0.1–1.0)
Monocytes Relative: 13 %
Neutro Abs: 1.6 10*3/uL — ABNORMAL LOW (ref 1.7–7.7)
Neutrophils Relative %: 65 %
Platelets: 287 10*3/uL (ref 150–400)
RBC: 2.87 MIL/uL — ABNORMAL LOW (ref 4.22–5.81)
RDW: 19.2 % — ABNORMAL HIGH (ref 11.5–15.5)
WBC: 2.4 10*3/uL — ABNORMAL LOW (ref 4.0–10.5)
nRBC: 0 % (ref 0.0–0.2)

## 2021-02-15 LAB — BASIC METABOLIC PANEL - CANCER CENTER ONLY
Anion gap: 11 (ref 5–15)
BUN: 10 mg/dL (ref 6–20)
CO2: 26 mmol/L (ref 22–32)
Calcium: 9.9 mg/dL (ref 8.9–10.3)
Chloride: 102 mmol/L (ref 98–111)
Creatinine: 1.11 mg/dL (ref 0.61–1.24)
GFR, Estimated: >60 mL/min (ref >60)
Glucose, Bld: 127 mg/dL — ABNORMAL HIGH (ref 70–99)
Potassium: 4.3 mmol/L (ref 3.5–5.1)
Sodium: 139 mmol/L (ref 135–145)

## 2021-02-15 LAB — MAGNESIUM: Magnesium: 2.2 mg/dL (ref 1.7–2.4)

## 2021-02-15 MED ORDER — DEXTROSE-NACL 5-0.45 % IV SOLN: Freq: Once | INTRAVENOUS | Status: DC

## 2021-02-15 MED ORDER — ONDANSETRON 8 MG PO TBDP: 8.0000 mg | ORAL_TABLET | Freq: Three times a day (TID) | ORAL | 0 refills | Status: DC | PRN

## 2021-02-15 MED ORDER — DEXTROSE-NACL 5-0.45 % IV SOLN
Freq: Once | INTRAVENOUS | Status: AC
  Filled 2021-02-15: qty 1000

## 2021-02-15 MED ORDER — HEPARIN SOD (PORK) LOCK FLUSH 100 UNIT/ML IV SOLN
500.0000 [IU] | Freq: Once | INTRAVENOUS | Status: AC | PRN
  Administered 2021-02-15: 500 [IU]
  Filled 2021-02-15: qty 5

## 2021-02-15 MED ORDER — SODIUM CHLORIDE 0.9% FLUSH
10.0000 mL | Freq: Once | INTRAVENOUS | Status: AC | PRN
  Administered 2021-02-15: 10 mL
  Filled 2021-02-15: qty 10

## 2021-02-15 MED ORDER — PALONOSETRON HCL INJECTION 0.25 MG/5ML: 0.2500 mg | Freq: Once | INTRAVENOUS | Status: DC

## 2021-02-15 MED ORDER — FENTANYL 25 MCG/HR TD PT72: 1.0000 | MEDICATED_PATCH | TRANSDERMAL | 0 refills | Status: DC

## 2021-02-15 MED ORDER — PALONOSETRON HCL INJECTION 0.25 MG/5ML
INTRAVENOUS | Status: AC
  Filled 2021-02-15: qty 5

## 2021-02-15 MED ORDER — PALONOSETRON HCL INJECTION 0.25 MG/5ML
0.2500 mg | Freq: Once | INTRAVENOUS | Status: AC
  Administered 2021-02-15: 0.25 mg via INTRAVENOUS

## 2021-02-15 MED ORDER — HYDROCODONE-ACETAMINOPHEN 7.5-325 MG/15ML PO SOLN: 15.0000 mL | Freq: Four times a day (QID) | ORAL | 0 refills | Status: DC | PRN

## 2021-02-15 NOTE — Patient Instructions (Signed)
[place] ONCOLOGY  Discharge Instructions: Thank you for choosing [facility] to provide your oncology and hematology care.   If you have a lab appointment with the [facility], please go directly to the [facility] and check in at the registration area.   Wear comfortable clothing and clothing appropriate for easy access to any Portacath or PICC line.   We strive to give you quality time with your provider. You may need to reschedule your appointment if you arrive late (15 or more minutes).  Arriving late affects you and other patients whose appointments are after yours.  Also, if you miss three or more appointments without notifying the office, you may be dismissed from the clinic at the provider's discretion.      For prescription refill requests, have your pharmacy contact our office and allow 72 hours for refills to be completed.    Today you received the following chemotherapy and/or immunotherapy agents IV fluids      To help prevent nausea and vomiting after your treatment, we encourage you to take your nausea medication as directed.  BELOW ARE SYMPTOMS THAT SHOULD BE REPORTED IMMEDIATELY: *FEVER GREATER THAN 100.4 F (38 C) OR HIGHER *CHILLS OR SWEATING *NAUSEA AND VOMITING THAT IS NOT CONTROLLED WITH YOUR NAUSEA MEDICATION *UNUSUAL SHORTNESS OF BREATH *UNUSUAL BRUISING OR BLEEDING *URINARY PROBLEMS (pain or burning when urinating, or frequent urination) *BOWEL PROBLEMS (unusual diarrhea, constipation, pain near the anus) TENDERNESS IN MOUTH AND THROAT WITH OR WITHOUT PRESENCE OF ULCERS (sore throat, sores in mouth, or a toothache) UNUSUAL RASH, SWELLING OR PAIN  UNUSUAL VAGINAL DISCHARGE OR ITCHING   Items with * indicate a potential emergency and should be followed up as soon as possible or go to the Emergency Department if any problems should occur.  Please show the CHEMOTHERAPY ALERT CARD or IMMUNOTHERAPY ALERT CARD at check-in to  the Emergency Department and triage nurse.  Should you have questions after your visit or need to cancel or reschedule your appointment, please contact [facility]  Dept: [phone]  and follow the prompts.  Office hours are 8:00 a.m. to 4:30 p.m. Monday - Friday. Please note that voicemails left after 4:00 p.m. may not be returned until the following business day.  We are closed weekends and major holidays. You have access to a nurse at all times for urgent questions. Please call the main number to the clinic Dept: [phone] and follow the prompts.   For any non-urgent questions, you may also contact your provider using MyChart. We now offer e-Visits for anyone 75 and older to request care online for non-urgent symptoms. For details visit [email or web address].   Also download the MyChart app! Go to the app store, search "MyChart", open the app, select [facility], and log in with your MyChart username and password.  Due to Covid, a mask is required upon entering the hospital/clinic. If you do not have a mask, one will be given to you upon arrival. For doctor visits, patients may have 1 support person aged 53 or older with them. For treatment visits, patients cannot have anyone with them due to current Covid guidelines and our immunocompromised population.

## 2021-02-15 NOTE — Assessment & Plan Note (Signed)
Severe mucositis and pain from antineoplastic therapy.  Continue using fentanyl patches every 72 hours, this has been refilled.,  Increase hydrocodone to 15 mL every 6 hours as needed for pain.  This has been refilled today.

## 2021-02-15 NOTE — Assessment & Plan Note (Signed)
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 He completed 5 weekly cycles of cisplatin.  He does not want to proceed with any further chemotherapy. He continues to have issues with intractable nausea, increased secretions, gag, worsening pain.  We will him at rest of the chemotherapy, he has received about 200 mg per metered square of cisplatin.

## 2021-02-15 NOTE — Telephone Encounter (Signed)
Scheduled appts per 7/26 sch msg. Called pt, no answer. Left msg with appts dates and times.

## 2021-02-15 NOTE — Progress Notes (Signed)
[place]   [phone] [phone]   Clinic Follow up Note   Patient Care Team: Pcp, No as PCP - General [place]elt, Stephani Police, RN as Oncology Nurse Navigator Eppie Gibson, MD as Consulting Physician (Radiation Oncology) Benay Pike, MD as Consulting Physician (Hematology and Oncology) Leta Baptist, MD as Consulting Physician (Otolaryngology) 02/15/2021  CHIEF COMPLAINT: Follow up HPV+ scc of oropharynx   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[place]) This is a very pleasant 50 year old male patient with no significant past medical history recently 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 He completed 5 weekly cycles of cisplatin.  He does not want to proceed with any further chemotherapy. He continues to have issues with intractable nausea, increased secretions, gag, worsening pain.  We will him at rest of the chemotherapy, he has received about 200 mg per metered square of cisplatin.  Intractable nausea and vomiting He has increased secretions given the active treatment and that this leads to a constant gag, nausea and vomiting.  He has been using Zofran and Ativan for nausea.  We will switch his Zofran to sublingual disintegrating tablet every 8 hours as needed.  He can continue using Ativan. He can continue using scopolamine patch for secretions, off label he can try using 2 patches but the maximum dose is 1 mg patch every 72 hours which is what he has been using.   Weight loss, unintentional He has lost over 10 pounds since last visit.  He is unable to tolerate G-tube feeding.  We will try to transition to [facility].  Have recommended using continuous tube feeds at at least 20 mL/h and if he can tolerate it, we can uptitrate.  We will also arrange for fluids about 2 or 3 times a week, D5 half-normal saline and some IV nausea  medications.  Pancytopenia ([place]) Repeat labs ordered today  Neoplasm related pain Severe mucositis and pain from antineoplastic therapy.  Continue using fentanyl patches every 72 hours, this has been refilled.,  Increase hydrocodone to 15 mL every 6 hours as needed for pain.  This has been refilled today.   SUMMARY OF ONCOLOGIC HISTORY: Oncology History Overview Note  50 yr old male who has experiencing chronic sore throat for the past few months. He underwent a CT neck which showed primarily a submucosal 3 cm mass within the left tonsillar fossa also noted to have 3.2 cm left jugulodigastric lymph node, findings were concerning for left tonsillar malignancy and malignant lymph node. He underwent left tonsillectomy by Dr. [name] and pathology showed invasive squamous cell carcinoma, HPV mediated.  He He had PET imaging which showed evidence of left tonsillar cancer and left ipsilateral lymph nodes, from my review, no evidence of distant metastasis, I could not see an official report on the PET imaging. He is here for initial recommendations.   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[place])  12/06/2020 Initial Diagnosis   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[place])   12/06/2020 Cancer Staging   Staging form: Pharynx - HPV-Mediated Oropharynx, AJCC 8th Edition - Clinical stage from 12/06/2020: Stage I (cT2, cN1, cM0, p16+) - Signed by Benay Pike, MD on 12/06/2020  Stage prefix: Initial diagnosis    12/27/2020 -  Chemotherapy    Patient is on Treatment Plan: HEAD/NECK CISPLATIN Q7D       Cancer of tonsillar fossa ([place])  12/07/2020 Initial Diagnosis   Cancer of tonsillar fossa (  [place])    12/07/2020 Cancer Staging   Staging form: Pharynx - HPV-Mediated Oropharynx, AJCC 8th Edition - Clinical stage from 12/07/2020: Stage I (cT2, cN1, cM0, p16+) - Signed by Eppie Gibson, MD on 12/07/2020  Stage prefix: Initial diagnosis      CURRENT THERAPY: chemoRT with weekly cisplatin   INTERVAL  HISTORY:   Mr. Companion is here for follow-up after recent hospitalizations. He went back to the ED on Friday evening, left AMA because of the wait. He is here with his wife today.  He has not been doing well.  Multiple issues mostly with increased secretions and constant spitting and gagging, nausea and vomiting.  Wife says they have only been able to put 1 to 2 cans of Osmolite in his G-tube and his not tolerating it well.  He throws up the Osmolite.  He says his pain is uncontrolled because of the acid reflux burning his sore throat.  He has been taking fentanyl patch for long-acting pain medicine and as needed Hycet.  He has been using Hycet about 15 mL 3-4 times a day for pain control. Wife is concerned that he is losing weight rapidly.  No fevers or chills. Rest of the pertinent 10 point ROS reviewed and negative.   Past Medical History:  Diagnosis Date   Cancer [facility])    Sleep apnea    Tonsillar mass 11/16/2020    SURGICAL HISTORY: Past Surgical History:  Procedure Laterality Date   DIRECT LARYNGOSCOPY N/A 11/22/2020   Procedure: DIRECT LARYNGOSCOPY WITH BIOPSY;  Surgeon: Leta Baptist, MD;  Location: [facility];  Service: ENT;  Laterality: N/A;   FOOT SURGERY  2000   extra nivicular removal   IR GASTROSTOMY TUBE MOD SED  12/24/2020   IR IMAGING GUIDED PORT INSERTION  12/24/2020   SPINE SURGERY     TONSILLECTOMY Left 11/22/2020   Procedure: LEFT TONSILLECTOMY;  Surgeon: Leta Baptist, MD;  Location: [place];  Service: ENT;  Laterality: Left;    I have reviewed the social history and family history with the patient and they are unchanged from previous note.  ALLERGIES:  has No Known Allergies.  MEDICATIONS:  Current Outpatient Medications  Medication Sig Dispense Refill   ondansetron (ZOFRAN ODT) 8 MG disintegrating tablet Take 1 tablet (8 mg total) by mouth every 8 (eight) hours as needed for nausea or vomiting. 30 tablet 0   fentaNYL (DURAGESIC) 25 MCG/HR  Place 1 patch onto the skin every 3 (three) days. 10 patch 0   glycopyrrolate (ROBINUL) 1 MG tablet Take 1 tablet (1 mg total) by mouth every 12 (twelve) hours as needed. (Patient taking differently: Take 1 mg by mouth every 12 (twelve) hours as needed (stomach pain).) 10 tablet 0   HYDROcodone-acetaminophen (HYCET) 7.5-325 mg/15 ml solution Take 15 mLs by mouth every 6 (six) hours as needed for moderate pain or severe pain. 473 mL 0   lidocaine-prilocaine (EMLA) cream Apply 1 application topically as needed. (Patient not taking: Reported on 02/11/2021) 30 g 0   LORazepam (ATIVAN) 0.5 MG tablet Take 1 tablet (0.5 mg total) by mouth every 8 (eight) hours as needed. (Patient taking differently: Take 0.5 mg by mouth every 8 (eight) hours as needed for anxiety or sleep.) 30 tablet 0   Nutritional Supplements (FEEDING SUPPLEMENT, OSMOLITE 1.5 CAL,) LIQD Place 1,000 mLs into feeding tube continuous. Pt switching from bolus feeds to continuous feeds; please qs for 30 days 2000 mL 6   Nutritional Supplements (FEEDING  SUPPLEMENT, PROSOURCE TF,) liquid Place 45 mLs into feeding tube 2 (two) times daily. 90 mL 6   prochlorperazine (COMPAZINE) 10 MG tablet Take 1 tablet (10 mg total) by mouth every 6 (six) hours as needed (Nausea or vomiting). (Patient taking differently: Take 10 mg by mouth every 6 (six) hours as needed for nausea or vomiting (Nausea or vomiting).) 30 tablet 1   scopolamine (TRANSDERM-SCOP) 1 MG/3DAYS Place 1 patch (1.5 mg total) onto the skin every 3 (three) days. 10 patch 12   No current facility-administered medications for this visit.   Facility-Administered Medications Ordered in Other Visits  Medication Dose Route Frequency Provider Last Rate Last Admin   heparin lock flush 100 unit/mL  500 Units Intracatheter Once PRN Amaranta Mehl, MD       sodium chloride flush (NS) 0.9 % injection 10 mL  10 mL Intracatheter Once PRN Benay Pike, MD        PHYSICAL EXAMINATION: ECOG PERFORMANCE  STATUS: 1 - Symptomatic but completely ambulatory  Vitals:   02/15/21 0827  BP: 103/78  Pulse: (!) 118  Resp: 18  Temp: 98.9 F (37.2 C)  SpO2: 98%   Filed Weights   02/15/21 0827  Weight: 217 lb 4.8 oz (98.6 kg)   Appears to be in no acute distress, was gagging and spitting  No BLE edema.  LABORATORY DATA:  I have reviewed the data as listed CBC Latest Ref Rng & Units 02/15/2021 02/11/2021 02/08/2021  WBC 4.0 - 10.5 K/uL 2.4(L) 1.6(L) 1.1(LL)  Hemoglobin 13.0 - 17.0 g/dL 9.4(L) 9.2(L) 8.2(L)  Hematocrit 39.0 - 52.0 % 27.5(L) 27.1(L) 23.5(L)  Platelets 150 - 400 K/uL 287 170 99(L)     CMP Latest Ref Rng & Units 02/11/2021 02/06/2021 02/05/2021  Glucose 70 - 99 mg/dL 115(H) 101(H) 94  BUN 6 - 20 mg/dL 17 17 21(H)  Creatinine 0.61 - 1.24 mg/dL 1.04 0.89 0.90  Sodium 135 - 145 mmol/L 138 136 136  Potassium 3.5 - 5.1 mmol/L 4.7 3.7 3.8  Chloride 98 - 111 mmol/L 100 104 106  CO2 22 - 32 mmol/L '28 27 27  '$ Calcium 8.9 - 10.3 mg/dL 9.6 8.3(L) 8.2(L)  Total Protein 6.5 - 8.1 g/dL 7.0 5.4(L) 5.5(L)  Total Bilirubin 0.3 - 1.2 mg/dL 1.1 1.4(H) 1.3(H)  Alkaline Phos 38 - 126 U/L 81 59 59  AST 15 - 41 U/L '17 17 15  '$ ALT 0 - 44 U/L '23 23 26      '$ RADIOGRAPHIC STUDIES: I have personally reviewed the radiological images as listed and agreed with the findings in the report. No results found.   Benay Pike MD

## 2021-02-15 NOTE — Addendum Note (Signed)
Addended by: Adaline Sill on: 02/15/2021 02:40 PM   Modules accepted: Orders

## 2021-02-15 NOTE — Assessment & Plan Note (Signed)
He has increased secretions given the active treatment and that this leads to a constant gag, nausea and vomiting.  He has been using Zofran and Ativan for nausea.  We will switch his Zofran to sublingual disintegrating tablet every 8 hours as needed.  He can continue using Ativan. He can continue using scopolamine patch for secretions, off label he can try using 2 patches but the maximum dose is 1 mg patch every 72 hours which is what he has been using.

## 2021-02-15 NOTE — Progress Notes (Signed)
Nurse navigator requested nutrition follow-up today while patient was receiving IV fluids for tonsil cancer. Current weight was documented as 217 pounds July 26. Patient status post lengthy hospital stay. He is here today to receive IV fluids with IV nausea medication. Patient reports he is constantly vomiting.  This could be anything from mucus to stomach acid to tube feeding. States he has not tolerated Osmolite 1.5 bolus or continuous feeding.  Estimated nutrition needs: 2600-2800 cal, 130-145 g protein, 2.8 L fluid.  Nutrition diagnosis: Inadequate oral intake continues.  Intervention: Provided samples of [employer] peptide 1.5 and instructed patient to begin at 20 mL an hour and run continuously and do not advance.  If patient does tolerate formula, increase by 10 mL daily.  Goal rate to be determined based on number of hours patient can tolerate formula.  5 cartons of [employer] peptide 1.5 provides 2500 cal, 120 g protein, 1140 mL free water.  Patient would need an additional 1360 mL free water daily by mouth or free water flushes.  Provided 1 sample case of [employer] [place] peptide 1.5. I will contact patient either tomorrow or Thursday and follow-up on tube feeding tolerance. Orders to be written based on tolerance.  Monitoring, evaluation, goals: Patient will tolerate continuous tube feeding to meet greater than 90% estimated nutrition needs.  Next visit: Telephone call Wednesday or Thursday this week.  **Disclaimer: This note was dictated with voice recognition software. Similar sounding words can inadvertently be transcribed and this note may contain transcription errors which may not have been corrected upon publication of note.**

## 2021-02-15 NOTE — Assessment & Plan Note (Signed)
He has lost over 10 pounds since last visit.  He is unable to tolerate G-tube feeding.  We will try to transition to [facility].  Have recommended using continuous tube feeds at at least 20 mL/h and if he can tolerate it, we can uptitrate.  We will also arrange for fluids about 2 or 3 times a week, D5 half-normal saline and some IV nausea medications.

## 2021-02-15 NOTE — Assessment & Plan Note (Signed)
Repeat labs ordered today 

## 2021-02-16 ENCOUNTER — Encounter: Payer: Self-pay | Admitting: Nutrition

## 2021-02-16 ENCOUNTER — Ambulatory Visit: Payer: Managed Care, Other (non HMO)

## 2021-02-16 ENCOUNTER — Telehealth: Payer: Self-pay | Admitting: Nutrition

## 2021-02-16 NOTE — Telephone Encounter (Signed)
Contacted patient by telephone to follow up on change in tube feeding from Osmolite 1.5 to [place] 1.5.  Patient reports he took 10 steps after IVF yesterday and vomited up a "half of bag" so thinks the IVF didn't agree with him. He is not scheduled for IVF today.  He reports things are going OK today. He got "good rest" last night. States he feels better when he just lays in the bed. Assures me he is elevating the head of his bed to better tolerate tube feeding. States he started new formula this morning at 5:00 am at 20 mL/hr. He states things were going well so he bumped the TF up to 30 mL /hr about 30 min before I called. He denies vomiting this morning.  Enforced importance of staying elevated during continuous TF. Recommended slow advancement of [facility] Peptide 1.5, no faster than 10 mL every 4 hours. Goal if tolerated would be 100 mL/hr over 18 hours or 75 mL/hr over 24 hours.If patient continues to tolerate formula, will write new orders tomorrow.  Will follow up by telephone tomorrow.

## 2021-02-17 ENCOUNTER — Telehealth: Payer: Self-pay | Admitting: Nutrition

## 2021-02-17 ENCOUNTER — Encounter: Payer: Managed Care, Other (non HMO) | Admitting: Nutrition

## 2021-02-17 ENCOUNTER — Ambulatory Visit: Payer: Managed Care, Other (non HMO)

## 2021-02-17 ENCOUNTER — Other Ambulatory Visit: Payer: Self-pay | Admitting: Nutrition

## 2021-02-17 ENCOUNTER — Other Ambulatory Visit: Payer: Self-pay

## 2021-02-17 ENCOUNTER — Inpatient Hospital Stay: Payer: Managed Care, Other (non HMO)

## 2021-02-17 VITALS — BP 109/70 | HR 82 | Temp 97.6°F | Resp 16

## 2021-02-17 DIAGNOSIS — C109 Malignant neoplasm of oropharynx, unspecified: Secondary | ICD-10-CM

## 2021-02-17 DIAGNOSIS — Z510 Encounter for antineoplastic radiation therapy: Secondary | ICD-10-CM | POA: Diagnosis not present

## 2021-02-17 MED ORDER — HEPARIN SOD (PORK) LOCK FLUSH 100 UNIT/ML IV SOLN
500.0000 [IU] | Freq: Once | INTRAVENOUS | Status: AC | PRN
  Administered 2021-02-17: 500 [IU]
  Filled 2021-02-17: qty 5

## 2021-02-17 MED ORDER — KATE FARMS PEPTIDE 1.5 EN LIQD: 1800.0000 mL | Freq: Every day | ENTERAL | 6 refills | Status: DC

## 2021-02-17 MED ORDER — PALONOSETRON HCL INJECTION 0.25 MG/5ML
0.2500 mg | Freq: Once | INTRAVENOUS | Status: AC
  Administered 2021-02-17: 0.25 mg via INTRAVENOUS
  Filled 2021-02-17: qty 5

## 2021-02-17 MED ORDER — SODIUM CHLORIDE 0.9% FLUSH
10.0000 mL | Freq: Once | INTRAVENOUS | Status: AC | PRN
  Administered 2021-02-17: 10 mL
  Filled 2021-02-17: qty 10

## 2021-02-17 MED ORDER — DEXTROSE-NACL 5-0.45 % IV SOLN
1000.0000 mL | INTRAVENOUS | Status: DC
  Filled 2021-02-17: qty 1000

## 2021-02-17 NOTE — Progress Notes (Signed)
Orders written to change tube feeding to [facility] peptide 1.5 at 30 mL an hour via G-tube.  Increase 10 mL daily to goal rate of 75 mL over 24 hours for 100 mL over 18 hours if tolerated.  [employer] contacted.  Patient did not tolerate Osmolite 1.5. Experienced ongoing nausea, vomiting and weight loss.

## 2021-02-17 NOTE — Patient Instructions (Signed)

## 2021-02-17 NOTE — Telephone Encounter (Signed)
I contacted patient by telephone however he was not available.  Left message with name and phone number.  Expressed importance of following up on tube feeding tolerance.

## 2021-02-18 ENCOUNTER — Ambulatory Visit: Payer: Managed Care, Other (non HMO)

## 2021-02-18 ENCOUNTER — Ambulatory Visit: Payer: Managed Care, Other (non HMO) | Admitting: Rehabilitation

## 2021-02-18 ENCOUNTER — Encounter: Payer: Self-pay | Admitting: Rehabilitation

## 2021-02-18 DIAGNOSIS — R600 Localized edema: Secondary | ICD-10-CM

## 2021-02-18 DIAGNOSIS — L599 Disorder of the skin and subcutaneous tissue related to radiation, unspecified: Secondary | ICD-10-CM

## 2021-02-18 DIAGNOSIS — R1310 Dysphagia, unspecified: Secondary | ICD-10-CM | POA: Diagnosis not present

## 2021-02-18 DIAGNOSIS — R293 Abnormal posture: Secondary | ICD-10-CM

## 2021-02-18 DIAGNOSIS — C090 Malignant neoplasm of tonsillar fossa: Secondary | ICD-10-CM

## 2021-02-18 NOTE — Therapy (Signed)
[place], [place], [address] Phone: [phone]   Fax:  [phone]  Physical Therapy Treatment  Patient Details  Name: Gerald [name] MRN: [id number] Date of Birth: 05/26/1971 Referring Provider (PT): Reita May Date: 02/18/2021   PT End of Session - 02/18/21 1053     Visit Number 2    Number of Visits 8    Date for PT Re-Evaluation 03/18/21    PT Start Time 1013    PT Stop Time 1049    PT Time Calculation (min) 36 min    Activity Tolerance Patient tolerated treatment well;Treatment limited secondary to medical complications (Comment)    Behavior During Therapy [facility] for tasks assessed/performed             Past Medical History:  Diagnosis Date   Cancer [facility])    Sleep apnea    Tonsillar mass 11/16/2020    Past Surgical History:  Procedure Laterality Date   DIRECT LARYNGOSCOPY N/A 11/22/2020   Procedure: DIRECT LARYNGOSCOPY WITH BIOPSY;  Surgeon: Leta Baptist, MD;  Location: [facility];  Service: ENT;  Laterality: N/A;   FOOT SURGERY  2000   extra nivicular removal   IR GASTROSTOMY TUBE MOD SED  12/24/2020   IR IMAGING GUIDED PORT INSERTION  12/24/2020   SPINE SURGERY     TONSILLECTOMY Left 11/22/2020   Procedure: LEFT TONSILLECTOMY;  Surgeon: Leta Baptist, MD;  Location: [place];  Service: ENT;  Laterality: Left;    There were no vitals filed for this visit.   Subjective Assessment - 02/18/21 1017     Subjective I am starting to swell already - can I get some compression or something. 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  Hospitalized x 12 days for side effects has been out x 1-2 weeks now.  Still losing weight.    Currently in Pain? Yes    Pain Score 7    neck is 2   Pain Location Throat    Pain Orientation Left    Pain  Descriptors / Indicators Burning;Sore    Pain Type Acute pain    Pain Onset 1 to 4 weeks ago    Pain Frequency Constant                   LYMPHEDEMA/ONCOLOGY QUESTIONNAIRE - 02/18/21 0001       Type   Cancer Type left tonsillar cancer      Treatment   Active Chemotherapy Treatment No    Past Chemotherapy Treatment Yes    Active Radiation Treatment No    Past Radiation Treatment Yes      What other symptoms do you have   Are you having Pain Yes      Head and Neck   4 cm superior to sternal notch around neck 42 cm    6 cm superior to sternal notch around neck 45 cm    8 cm superior to sternal notch around neck 46.5 cm    Other has lost 60-70 pounds                        [facility] Adult PT Treatment/Exercise - 02/18/21 0001       Manual Therapy   Manual Therapy Edema management    Edema Management Pt has some  skin still red and healing wet desquamation on the left side of the neck but pt reports no pain with it.  Made a dome shaped 1/2" foam in tg soft for initial compression.  Pt reports it feels much better upon immediate application,  Made to not aggravate healing tissue and pt was educated on watching for this.  Also brief educatinon on self MLD to include: clavicle, breathing, axillae, lateral trunk, 2 hands on anterior chest, and then just milking down the anterior neck for now to avoid redness.                    PT Education - 02/18/21 1053     Education Details how to use compression, how to order [facility], flexitouch options, MLD    Person(s) Educated Patient    Methods Explanation;Demonstration;Tactile cues;Handout;Verbal cues    Comprehension Verbalized understanding;Need further instruction                 PT Long Term Goals - 02/18/21 1059       PT LONG TERM GOAL #1   Title Pt will return to baseline cervical ROM and not demonstrate any signs or symptoms of lymphedema    Status On-going      PT LONG TERM GOAL #2    Title Pt will be ind with self MLD for the neck and educated on flexitouch    Time 4    Period Weeks    Status New      PT LONG TERM GOAL #3   Title Pt will obtain appropriate compression for long term use    Time 4    Period Weeks    Status New                   Plan - 02/18/21 1055     Clinical Impression Statement Pt presents only 1.5 weeks post last radiation which was stopped by pt request due to tolerance.  Pt has lost 60-70 pounds since baseline measurements were taken so the circumferences were smaller than baseline but edema in the anterior neck is present.  Pt is struggling with swallowing even his saliva and has high levels of pain internally but minimal exterior neck.  Pt has tolerance to touch at the neck so he was given a foam flat garment to try, ordering information for marena, and was shown the flexitouch.  Pt is already scheduled to come back in 1.5 weeks so we will recheck status at that time.    Personal Factors and Comorbidities Comorbidity 2    Comorbidities recent chemotherapy and radiation, recenet hospitalzation    Examination-Participation Restrictions Occupation    Stability/Clinical Decision Making Stable/Uncomplicated    Clinical Decision Making Low    Rehab Potential Good    PT Frequency 2x / week    PT Duration 4 weeks    PT Treatment/Interventions ADLs/Self Care Home Management;Therapeutic exercise;Patient/family education;Manual techniques;DME Instruction    PT Next Visit Plan how was garment, remeasure neck, assess further needs - PT performed MLD vs teaching self MLD and continue up to 2x per week for 4 weeks, check AROM    Consulted and Agree with Plan of Care Patient             Patient will benefit from skilled therapeutic intervention in order to improve the following deficits and impairments:  Postural dysfunction, Pain, Decreased knowledge of precautions, Increased edema  Visit Diagnosis: Abnormal posture  Malignant neoplasm of  tonsillar fossa ([facility])  Disorder of the skin and subcutaneous tissue related to radiation, unspecified  Localized edema     Problem List Patient Active Problem List   Diagnosis Date Noted   Intractable nausea and vomiting 01/29/2021   Dehydration 01/29/2021   Pancytopenia ([place]) 01/29/2021   GERD (gastroesophageal reflux disease) 01/29/2021   Weight loss, unintentional 01/04/2021   Tinnitus 01/04/2021   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

## 2021-02-19 ENCOUNTER — Other Ambulatory Visit: Payer: Self-pay

## 2021-02-19 ENCOUNTER — Inpatient Hospital Stay: Payer: Managed Care, Other (non HMO)

## 2021-02-19 VITALS — BP 112/76 | HR 79 | Temp 98.0°F | Resp 20 | Ht 70.0 in

## 2021-02-19 DIAGNOSIS — C109 Malignant neoplasm of oropharynx, unspecified: Secondary | ICD-10-CM

## 2021-02-19 DIAGNOSIS — Z510 Encounter for antineoplastic radiation therapy: Secondary | ICD-10-CM | POA: Diagnosis not present

## 2021-02-19 MED ORDER — PALONOSETRON HCL INJECTION 0.25 MG/5ML
INTRAVENOUS | Status: AC
  Filled 2021-02-19: qty 5

## 2021-02-19 MED ORDER — SODIUM CHLORIDE 0.9% FLUSH
10.0000 mL | Freq: Once | INTRAVENOUS | Status: AC | PRN
  Administered 2021-02-19: 10 mL
  Filled 2021-02-19: qty 10

## 2021-02-19 MED ORDER — HEPARIN SOD (PORK) LOCK FLUSH 100 UNIT/ML IV SOLN
500.0000 [IU] | Freq: Once | INTRAVENOUS | Status: AC | PRN
  Administered 2021-02-19: 500 [IU]
  Filled 2021-02-19: qty 5

## 2021-02-19 MED ORDER — DEXTROSE-NACL 5-0.45 % IV SOLN: INTRAVENOUS | Status: DC

## 2021-02-19 MED ORDER — PALONOSETRON HCL INJECTION 0.25 MG/5ML
0.2500 mg | Freq: Once | INTRAVENOUS | Status: AC
  Administered 2021-02-19: 0.25 mg via INTRAVENOUS

## 2021-02-19 NOTE — Patient Instructions (Signed)

## 2021-02-21 ENCOUNTER — Telehealth: Payer: Self-pay | Admitting: Hematology and Oncology

## 2021-02-21 ENCOUNTER — Ambulatory Visit: Payer: Managed Care, Other (non HMO)

## 2021-02-21 NOTE — Telephone Encounter (Signed)
Scheduled appt per 8/1 sch msg. Called pt, no answer. Left msg with appt date and time.

## 2021-02-22 ENCOUNTER — Ambulatory Visit: Payer: Managed Care, Other (non HMO)

## 2021-02-22 ENCOUNTER — Inpatient Hospital Stay: Payer: Managed Care, Other (non HMO)

## 2021-02-22 ENCOUNTER — Other Ambulatory Visit: Payer: Self-pay

## 2021-02-22 ENCOUNTER — Inpatient Hospital Stay: Payer: Managed Care, Other (non HMO) | Admitting: Nutrition

## 2021-02-22 DIAGNOSIS — Z9221 Personal history of antineoplastic chemotherapy: Secondary | ICD-10-CM | POA: Insufficient documentation

## 2021-02-22 DIAGNOSIS — Z79899 Other long term (current) drug therapy: Secondary | ICD-10-CM | POA: Insufficient documentation

## 2021-02-22 DIAGNOSIS — R1310 Dysphagia, unspecified: Secondary | ICD-10-CM | POA: Diagnosis present

## 2021-02-22 DIAGNOSIS — R112 Nausea with vomiting, unspecified: Secondary | ICD-10-CM | POA: Insufficient documentation

## 2021-02-22 DIAGNOSIS — C109 Malignant neoplasm of oropharynx, unspecified: Secondary | ICD-10-CM | POA: Insufficient documentation

## 2021-02-22 DIAGNOSIS — G893 Neoplasm related pain (acute) (chronic): Secondary | ICD-10-CM | POA: Insufficient documentation

## 2021-02-22 MED ORDER — PALONOSETRON HCL INJECTION 0.25 MG/5ML
0.2500 mg | Freq: Once | INTRAVENOUS | Status: AC
  Administered 2021-02-22: 0.25 mg via INTRAVENOUS

## 2021-02-22 MED ORDER — HEPARIN SOD (PORK) LOCK FLUSH 100 UNIT/ML IV SOLN
500.0000 [IU] | Freq: Once | INTRAVENOUS | Status: AC | PRN
  Administered 2021-02-22: 500 [IU]
  Filled 2021-02-22: qty 5

## 2021-02-22 MED ORDER — SODIUM CHLORIDE 0.9 % IV SOLN: 8.0000 mg | Freq: Once | INTRAVENOUS | Status: DC

## 2021-02-22 MED ORDER — DEXTROSE-NACL 5-0.45 % IV SOLN: Freq: Once | INTRAVENOUS | Status: AC

## 2021-02-22 MED ORDER — SODIUM CHLORIDE 0.9% FLUSH
10.0000 mL | Freq: Once | INTRAVENOUS | Status: AC | PRN
  Administered 2021-02-22: 10 mL
  Filled 2021-02-22: qty 10

## 2021-02-22 MED ORDER — PALONOSETRON HCL INJECTION 0.25 MG/5ML
INTRAVENOUS | Status: AC
  Filled 2021-02-22: qty 5

## 2021-02-22 NOTE — Patient Instructions (Signed)

## 2021-02-22 NOTE — Progress Notes (Signed)
Nutrition follow-up completed with patient during IV fluids.  Patient has completed treatment for tonsil cancer.    Patient reports [place] has changed his life.  His nausea is much improved throughout the day however he still vomits late at night.  Reports he is tolerating one half carton of [employer] peptide 1.5,4 times a day.  Reports he no longer uses continuous tube feeding because it takes too long.  States he cannot tolerate more than one half carton at a time bolus.  He thinks he is getting better every day.  He still cannot swallow any saliva but say it changes and improves daily.  States he is having a small bowel movement regularly.  Questioning how much water he should be using through his feeding tube.    Patient reports current weight 210 pounds indicating a 7 pound weight loss over 1 week which is significant. Patient has lost 22% of his body weight over 3 months.   (268 pounds May 2 to 210 pounds August 2.)  Estimated nutrition needs: 2600-2800 cal, 130-145 g protein, 2.8 L fluid.  TF goal: 1800 mL [employer] 1.5 Peptide provides 2768 kcal, 133.2 grams protein, 1260 mL free water.  Nutrition diagnosis: Inadequate oral intake continues. He meets criteria for severe malnutrition in the context of critical illness secondary to percent weight loss and less than or equal to 75% energy intake for greater or equal to 1 months.  Patient also has depletion of body fat and muscle mass.  Intervention: Patient receiving approximately 1000 cal and 48 g of protein via 2 cartons of [employer] 1.5 peptide.  Educated patient to work to increase volume of tube feeding boluses to better meet nutritional needs.  Suggested patient could run continuous feedings while he is sleeping.  Recommended an additional 1000 mL of water daily.  Patient states he has adequate supplies and formula.  He feels he is progressing.  Monitoring, evaluation, goals:.  Patient will work to increase calories and protein to  minimize further weight loss and replete nutrient stores.  Next visit: To be scheduled.  **Disclaimer: This note was dictated with voice recognition software. Similar sounding words can inadvertently be transcribed and this note may contain transcription errors which may not have been corrected upon publication of note.**

## 2021-02-23 ENCOUNTER — Ambulatory Visit: Payer: Managed Care, Other (non HMO)

## 2021-02-24 ENCOUNTER — Ambulatory Visit: Payer: Managed Care, Other (non HMO)

## 2021-02-24 ENCOUNTER — Inpatient Hospital Stay: Payer: Managed Care, Other (non HMO)

## 2021-02-24 ENCOUNTER — Other Ambulatory Visit: Payer: Self-pay

## 2021-02-24 VITALS — BP 112/76 | HR 79 | Temp 99.4°F | Resp 18

## 2021-02-24 DIAGNOSIS — C109 Malignant neoplasm of oropharynx, unspecified: Secondary | ICD-10-CM

## 2021-02-24 DIAGNOSIS — R1310 Dysphagia, unspecified: Secondary | ICD-10-CM | POA: Diagnosis not present

## 2021-02-24 LAB — CBC WITH DIFFERENTIAL (CANCER CENTER ONLY)
Abs Immature Granulocytes: 0.02 10*3/uL (ref 0.00–0.07)
Basophils Absolute: 0 10*3/uL (ref 0.0–0.1)
Basophils Relative: 1 %
Eosinophils Absolute: 0 10*3/uL (ref 0.0–0.5)
Eosinophils Relative: 1 %
HCT: 30.2 % — ABNORMAL LOW (ref 39.0–52.0)
Hemoglobin: 10.2 g/dL — ABNORMAL LOW (ref 13.0–17.0)
Immature Granulocytes: 1 %
Lymphocytes Relative: 18 %
Lymphs Abs: 0.7 10*3/uL (ref 0.7–4.0)
MCH: 33.2 pg (ref 26.0–34.0)
MCHC: 33.8 g/dL (ref 30.0–36.0)
MCV: 98.4 fL (ref 80.0–100.0)
Monocytes Absolute: 0.5 10*3/uL (ref 0.1–1.0)
Monocytes Relative: 13 %
Neutro Abs: 2.5 10*3/uL (ref 1.7–7.7)
Neutrophils Relative %: 66 %
Platelet Count: 237 10*3/uL (ref 150–400)
RBC: 3.07 MIL/uL — ABNORMAL LOW (ref 4.22–5.81)
RDW: 20 % — ABNORMAL HIGH (ref 11.5–15.5)
WBC Count: 3.8 10*3/uL — ABNORMAL LOW (ref 4.0–10.5)
nRBC: 0 % (ref 0.0–0.2)

## 2021-02-24 LAB — BASIC METABOLIC PANEL - CANCER CENTER ONLY
Anion gap: 9 (ref 5–15)
BUN: 21 mg/dL — ABNORMAL HIGH (ref 6–20)
CO2: 28 mmol/L (ref 22–32)
Calcium: 9.5 mg/dL (ref 8.9–10.3)
Chloride: 106 mmol/L (ref 98–111)
Creatinine: 1.11 mg/dL (ref 0.61–1.24)
GFR, Estimated: >60 mL/min (ref >60)
Glucose, Bld: 107 mg/dL — ABNORMAL HIGH (ref 70–99)
Potassium: 4.1 mmol/L (ref 3.5–5.1)
Sodium: 143 mmol/L (ref 135–145)

## 2021-02-24 MED ORDER — PALONOSETRON HCL INJECTION 0.25 MG/5ML
INTRAVENOUS | Status: AC
  Filled 2021-02-24: qty 5

## 2021-02-24 MED ORDER — PALONOSETRON HCL INJECTION 0.25 MG/5ML
0.2500 mg | Freq: Once | INTRAVENOUS | Status: AC
  Administered 2021-02-24: 0.25 mg via INTRAVENOUS

## 2021-02-24 MED ORDER — DEXTROSE-NACL 5-0.45 % IV SOLN: Freq: Once | INTRAVENOUS | Status: AC

## 2021-02-24 MED ORDER — HEPARIN SOD (PORK) LOCK FLUSH 100 UNIT/ML IV SOLN
500.0000 [IU] | Freq: Once | INTRAVENOUS | Status: AC | PRN
Start: 2021-02-24 — End: 2021-02-24
  Administered 2021-02-24: 500 [IU]
  Filled 2021-02-24: qty 5

## 2021-02-24 MED ORDER — SODIUM CHLORIDE 0.9% FLUSH
10.0000 mL | Freq: Once | INTRAVENOUS | Status: AC | PRN
  Administered 2021-02-24: 10 mL
  Filled 2021-02-24: qty 10

## 2021-02-24 NOTE — Patient Instructions (Signed)

## 2021-02-26 ENCOUNTER — Inpatient Hospital Stay: Payer: Managed Care, Other (non HMO)

## 2021-02-26 ENCOUNTER — Other Ambulatory Visit: Payer: Self-pay

## 2021-02-26 VITALS — BP 111/80 | HR 76 | Temp 98.0°F | Resp 18

## 2021-02-26 DIAGNOSIS — C109 Malignant neoplasm of oropharynx, unspecified: Secondary | ICD-10-CM

## 2021-02-26 DIAGNOSIS — R1310 Dysphagia, unspecified: Secondary | ICD-10-CM | POA: Diagnosis not present

## 2021-02-26 MED ORDER — PALONOSETRON HCL INJECTION 0.25 MG/5ML
INTRAVENOUS | Status: AC
  Filled 2021-02-26: qty 5

## 2021-02-26 MED ORDER — HEPARIN SOD (PORK) LOCK FLUSH 100 UNIT/ML IV SOLN
500.0000 [IU] | Freq: Once | INTRAVENOUS | Status: AC | PRN
  Administered 2021-02-26: 500 [IU]
  Filled 2021-02-26: qty 5

## 2021-02-26 MED ORDER — PALONOSETRON HCL INJECTION 0.25 MG/5ML
0.2500 mg | Freq: Once | INTRAVENOUS | Status: AC
  Administered 2021-02-26: 0.25 mg via INTRAVENOUS

## 2021-02-26 MED ORDER — DEXTROSE-NACL 5-0.45 % IV SOLN: INTRAVENOUS | Status: DC

## 2021-02-28 ENCOUNTER — Other Ambulatory Visit: Payer: Self-pay | Admitting: Nurse Practitioner

## 2021-03-01 ENCOUNTER — Ambulatory Visit: Payer: Managed Care, Other (non HMO)

## 2021-03-01 ENCOUNTER — Other Ambulatory Visit: Payer: Self-pay

## 2021-03-01 ENCOUNTER — Telehealth: Payer: Self-pay | Admitting: Hematology and Oncology

## 2021-03-01 ENCOUNTER — Encounter: Payer: Self-pay | Admitting: Physical Therapy

## 2021-03-01 ENCOUNTER — Inpatient Hospital Stay: Payer: Managed Care, Other (non HMO)

## 2021-03-01 VITALS — BP 113/70 | HR 78 | Resp 16

## 2021-03-01 DIAGNOSIS — R1310 Dysphagia, unspecified: Secondary | ICD-10-CM | POA: Diagnosis not present

## 2021-03-01 DIAGNOSIS — C109 Malignant neoplasm of oropharynx, unspecified: Secondary | ICD-10-CM

## 2021-03-01 MED ORDER — SODIUM CHLORIDE 0.9% FLUSH
10.0000 mL | Freq: Once | INTRAVENOUS | Status: AC | PRN
  Administered 2021-03-01: 10 mL
  Filled 2021-03-01: qty 10

## 2021-03-01 MED ORDER — HEPARIN SOD (PORK) LOCK FLUSH 100 UNIT/ML IV SOLN
500.0000 [IU] | Freq: Once | INTRAVENOUS | Status: AC | PRN
  Administered 2021-03-01: 500 [IU]
  Filled 2021-03-01: qty 5

## 2021-03-01 MED ORDER — DEXTROSE-NACL 5-0.45 % IV SOLN: Freq: Once | INTRAVENOUS | Status: AC

## 2021-03-01 MED ORDER — PALONOSETRON HCL INJECTION 0.25 MG/5ML
INTRAVENOUS | Status: AC
  Filled 2021-03-01: qty 5

## 2021-03-01 MED ORDER — PALONOSETRON HCL INJECTION 0.25 MG/5ML
0.2500 mg | Freq: Once | INTRAVENOUS | Status: AC
  Administered 2021-03-01: 0.25 mg via INTRAVENOUS

## 2021-03-01 NOTE — Progress Notes (Signed)
Ok to run fluids today at 999 per Cira Rue, NP

## 2021-03-01 NOTE — Telephone Encounter (Signed)
Scheduled appts per 8/8 sch msg. Pt aware.  

## 2021-03-02 ENCOUNTER — Encounter: Payer: Self-pay | Admitting: Hematology and Oncology

## 2021-03-02 ENCOUNTER — Other Ambulatory Visit: Payer: Self-pay

## 2021-03-03 ENCOUNTER — Other Ambulatory Visit: Payer: Self-pay

## 2021-03-03 ENCOUNTER — Ambulatory Visit: Payer: Managed Care, Other (non HMO)

## 2021-03-03 ENCOUNTER — Inpatient Hospital Stay (HOSPITAL_BASED_OUTPATIENT_CLINIC_OR_DEPARTMENT_OTHER): Payer: Managed Care, Other (non HMO) | Admitting: Hematology and Oncology

## 2021-03-03 ENCOUNTER — Inpatient Hospital Stay: Payer: Managed Care, Other (non HMO)

## 2021-03-03 ENCOUNTER — Encounter: Payer: Self-pay | Admitting: Hematology and Oncology

## 2021-03-03 DIAGNOSIS — C109 Malignant neoplasm of oropharynx, unspecified: Secondary | ICD-10-CM

## 2021-03-03 DIAGNOSIS — G893 Neoplasm related pain (acute) (chronic): Secondary | ICD-10-CM

## 2021-03-03 DIAGNOSIS — R112 Nausea with vomiting, unspecified: Secondary | ICD-10-CM | POA: Diagnosis not present

## 2021-03-03 DIAGNOSIS — R1310 Dysphagia, unspecified: Secondary | ICD-10-CM | POA: Diagnosis not present

## 2021-03-03 MED ORDER — DEXTROSE-NACL 5-0.45 % IV SOLN
Freq: Once | INTRAVENOUS | Status: AC
Start: 2021-03-03 — End: 2021-03-03

## 2021-03-03 MED ORDER — HEPARIN SOD (PORK) LOCK FLUSH 100 UNIT/ML IV SOLN
500.0000 [IU] | Freq: Once | INTRAVENOUS | Status: AC | PRN
  Administered 2021-03-03: 500 [IU]
  Filled 2021-03-03: qty 5

## 2021-03-03 MED ORDER — SODIUM CHLORIDE 0.9% FLUSH
10.0000 mL | Freq: Once | INTRAVENOUS | Status: AC | PRN
  Administered 2021-03-03: 10 mL
  Filled 2021-03-03: qty 10

## 2021-03-03 MED ORDER — PALONOSETRON HCL INJECTION 0.25 MG/5ML
0.2500 mg | Freq: Once | INTRAVENOUS | Status: AC
  Administered 2021-03-03: 0.25 mg via INTRAVENOUS
  Filled 2021-03-03: qty 5

## 2021-03-03 MED ORDER — HYDROCODONE-ACETAMINOPHEN 7.5-325 MG/15ML PO SOLN: 15.0000 mL | Freq: Four times a day (QID) | ORAL | 0 refills | Status: DC | PRN

## 2021-03-03 NOTE — Assessment & Plan Note (Signed)
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  He missed the last 4 doses of radiation.  He has been recovering well since he stopped chemotherapy.  He continues to need some IV fluids and antinausea medication in the infusion center.  His weight has been slowly improving.  We will continue to monitor him, he will return to clinic in 4 weeks with repeat labs.

## 2021-03-03 NOTE — Assessment & Plan Note (Signed)
He has been using fentanyl patch 25 mcg long-acting hydrocodone as needed for pain control.  We will refill hydrocodone today for as needed pain control.  When he returns for follow-up, we will try to discontinue fentanyl and only continue short acting while he is recovering.  He is agreeable to these recommendations.

## 2021-03-03 NOTE — Patient Instructions (Signed)

## 2021-03-03 NOTE — Progress Notes (Signed)
Per Dr. Chryl Heck, it's okay to run IVF over one hour instead of two.

## 2021-03-03 NOTE — Progress Notes (Signed)
Gerald [name]   [phone] [phone]   Clinic Follow up Note   Patient Care Team: Pcp, No as PCP - General [place]elt, Stephani Police, RN as Oncology Nurse Navigator Eppie Gibson, MD as Consulting Physician (Radiation Oncology) Benay Pike, MD as Consulting Physician (Hematology and Oncology) Leta Baptist, MD as Consulting Physician (Otolaryngology) 03/03/2021  CHIEF COMPLAINT: Follow up HPV+ scc of oropharynx   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[place]) This is a very pleasant 50 year old male patient with no significant past medical history recently diagnosed with 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  He missed the last 4 doses of radiation.  He has been recovering well since he stopped chemotherapy.  He continues to need some IV fluids and antinausea medication in the infusion center.  His weight has been slowly improving.  We will continue to monitor him, he will return to clinic in 4 weeks with repeat labs.  Neoplasm related pain He has been using fentanyl patch 25 mcg long-acting hydrocodone as needed for pain control.  We will refill hydrocodone today for as needed pain control.  When he returns for follow-up, we will try to discontinue fentanyl and only continue short acting while he is recovering.  He is agreeable to these recommendations.  Intractable nausea and vomiting This has significantly improved since last visit we started giving him Aloxi in the IV 3 times a week. He will continue to need scopolamine patches as well. He would like to continue IV fluids and Aloxi for another 2weeks and follow-up.   SUMMARY OF ONCOLOGIC HISTORY: Oncology History Overview Note  50 yr old male who has experiencing chronic sore throat for the past few months. He underwent a CT neck which showed primarily a submucosal 3 cm mass within the left tonsillar fossa also  noted to have 3.2 cm left jugulodigastric lymph node, findings were concerning for left tonsillar malignancy and malignant lymph node. He underwent left tonsillectomy by Dr. [name] and pathology showed invasive squamous cell carcinoma, HPV mediated.  He He had PET imaging which showed evidence of left tonsillar cancer and left ipsilateral lymph nodes, from my review, no evidence of distant metastasis, I could not see an official report on the PET imaging. He is here for initial recommendations.   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[place])   12/06/2020 Cancer Staging   Staging form: Pharynx - HPV-Mediated Oropharynx, AJCC 8th Edition - Clinical stage from 12/06/2020: Stage I (cT2, cN1, cM0, p16+) - Signed by Benay Pike, MD on 12/06/2020 Stage prefix: Initial diagnosis   12/27/2020 -  Chemotherapy    Patient is on Treatment Plan: HEAD/NECK CISPLATIN Q7D       Cancer of tonsillar fossa ([place])  12/07/2020 Initial Diagnosis   Cancer of tonsillar fossa ([place])   12/07/2020 Cancer Staging   Staging form: Pharynx - HPV-Mediated Oropharynx, AJCC 8th Edition - Clinical stage from 12/07/2020: Stage I (cT2, cN1, cM0, p16+) - Signed by Eppie Gibson, MD on 12/07/2020 Stage prefix: Initial diagnosis     CURRENT THERAPY: chemoRT with weekly cisplatin   INTERVAL HISTORY:   Gerald [name] is here for follow-up. Since last visit, he has been feeling much better.  He is still not able to eat or drink by mouth but he is using his G-tube feedings and he is getting fluids and nausea medication about  3 times a week.  His pain is well controlled on fentanyl long-acting as well as hydrocodone about 3 times a day as needed for pain.  He still continues to have increased secretions which bother him the most but scopolamine patch has been helping so far.  He just has to replace it little short of 72 hours sometimes. No nausea or vomiting at the  moment.  He has not been having severe episodes of nausea since we started him on Aloxi. No change in bowel habits or urinary habits. No new neurological complaints.  Rest of the pertinent 10 point ROS reviewed and negative.   Past Medical History:  Diagnosis Date   Cancer [facility])    Sleep apnea    Tonsillar mass 11/16/2020    SURGICAL HISTORY: Past Surgical History:  Procedure Laterality Date   DIRECT LARYNGOSCOPY N/A 11/22/2020   Procedure: DIRECT LARYNGOSCOPY WITH BIOPSY;  Surgeon: Leta Baptist, MD;  Location: [facility];  Service: ENT;  Laterality: N/A;   FOOT SURGERY  2000   extra nivicular removal   IR GASTROSTOMY TUBE MOD SED  12/24/2020   IR IMAGING GUIDED PORT INSERTION  12/24/2020   SPINE SURGERY     TONSILLECTOMY Left 11/22/2020   Procedure: LEFT TONSILLECTOMY;  Surgeon: Leta Baptist, MD;  Location: [facility];  Service: ENT;  Laterality: Left;    I have reviewed the social history and family history with the patient and they are unchanged from previous note.  ALLERGIES:  has No Known Allergies.  MEDICATIONS:  Current Outpatient Medications  Medication Sig Dispense Refill   fentaNYL (DURAGESIC) 25 MCG/HR Place 1 patch onto the skin every 3 (three) days. 10 patch 0   glycopyrrolate (ROBINUL) 1 MG tablet Take 1 tablet (1 mg total) by mouth every 12 (twelve) hours as needed. (Patient taking differently: Take 1 mg by mouth every 12 (twelve) hours as needed (stomach pain).) 10 tablet 0   lidocaine-prilocaine (EMLA) cream Apply 1 application topically as needed. 30 g 0   LORazepam (ATIVAN) 0.5 MG tablet Take 1 tablet (0.5 mg total) by mouth every 8 (eight) hours as needed. (Patient taking differently: Take 0.5 mg by mouth every 8 (eight) hours as needed for anxiety or sleep.) 30 tablet 0   Nutritional Supplements (KATE FARMS PEPTIDE 1.5) LIQD 1,800 mLs by Enteral route daily. 1800 mL 6   ondansetron (ZOFRAN ODT) 8 MG disintegrating tablet Take 1 tablet (8 mg  total) by mouth every 8 (eight) hours as needed for nausea or vomiting. 30 tablet 0   prochlorperazine (COMPAZINE) 10 MG tablet Take 1 tablet (10 mg total) by mouth every 6 (six) hours as needed (Nausea or vomiting). (Patient taking differently: Take 10 mg by mouth every 6 (six) hours as needed for nausea or vomiting (Nausea or vomiting).) 30 tablet 1   scopolamine (TRANSDERM-SCOP) 1 MG/3DAYS Place 1 patch (1.5 mg total) onto the skin every 3 (three) days. 10 patch 12   HYDROcodone-acetaminophen (HYCET) 7.5-325 mg/15 ml solution Take 15 mLs by mouth every 6 (six) hours as needed for moderate pain or severe pain. 473 mL 0   No current facility-administered medications for this visit.   Facility-Administered Medications Ordered in Other Visits  Medication Dose Route Frequency Provider Last Rate Last Admin   heparin lock flush 100 unit/mL  500 Units Intracatheter Once PRN [place] Dodgen, MD       sodium chloride flush (NS) 0.9 % injection 10 mL  10 mL Intracatheter Once PRN  Benay Pike, MD        PHYSICAL EXAMINATION: ECOG PERFORMANCE STATUS: 1 - Symptomatic but completely ambulatory  Vitals:   03/03/21 1007  BP: 122/83  Pulse: (!) 102  Resp: 18  Temp: 98.6 F (37 C)  SpO2: 100%   Filed Weights   03/03/21 1007  Weight: 213 lb 4.8 oz (96.8 kg)   Physical Exam Constitutional:      Appearance: Normal appearance.  HENT:     Head: Normocephalic and atraumatic.  Cardiovascular:     Rate and Rhythm: Normal rate and regular rhythm.     Pulses: Normal pulses.     Heart sounds: Normal heart sounds.  Pulmonary:     Effort: Pulmonary effort is normal.     Breath sounds: Normal breath sounds.  Abdominal:     General: Abdomen is flat. Bowel sounds are normal.  Musculoskeletal:        General: No swelling or tenderness.     Cervical back: Normal range of motion and neck supple. No rigidity.  Lymphadenopathy:     Cervical: No cervical adenopathy.  Skin:    General: Skin is warm and  dry.  Neurological:     General: No focal deficit present.     Mental Status: He is alert.  Psychiatric:        Mood and Affect: Mood normal.    LABORATORY DATA:  I have reviewed the data as listed CBC Latest Ref Rng & Units 02/24/2021 02/15/2021 02/11/2021  WBC 4.0 - 10.5 K/uL 3.8(L) 2.4(L) 1.6(L)  Hemoglobin 13.0 - 17.0 g/dL 10.2(L) 9.4(L) 9.2(L)  Hematocrit 39.0 - 52.0 % 30.2(L) 27.5(L) 27.1(L)  Platelets 150 - 400 K/uL 237 287 170     CMP Latest Ref Rng & Units 02/24/2021 02/15/2021 02/11/2021  Glucose 70 - 99 mg/dL 107(H) 127(H) 115(H)  BUN 6 - 20 mg/dL 21(H) 10 17  Creatinine 0.61 - 1.24 mg/dL 1.11 1.11 1.04  Sodium 135 - 145 mmol/L 143 139 138  Potassium 3.5 - 5.1 mmol/L 4.1 4.3 4.7  Chloride 98 - 111 mmol/L 106 102 100  CO2 22 - 32 mmol/L '28 26 28  '$ Calcium 8.9 - 10.3 mg/dL 9.5 9.9 9.6  Total Protein 6.5 - 8.1 g/dL - - 7.0  Total Bilirubin 0.3 - 1.2 mg/dL - - 1.1  Alkaline Phos 38 - 126 U/L - - 81  AST 15 - 41 U/L - - 17  ALT 0 - 44 U/L - - 23    RADIOGRAPHIC STUDIES: I have personally reviewed the radiological images as listed and agreed with the findings in the report. No results found.   Benay Pike MD

## 2021-03-03 NOTE — Assessment & Plan Note (Signed)
This has significantly improved since last visit we started giving him Aloxi in the IV 3 times a week. He will continue to need scopolamine patches as well. He would like to continue IV fluids and Aloxi for another 2weeks and follow-up.

## 2021-03-05 ENCOUNTER — Ambulatory Visit: Payer: Managed Care, Other (non HMO)

## 2021-03-05 ENCOUNTER — Inpatient Hospital Stay: Payer: Managed Care, Other (non HMO)

## 2021-03-05 ENCOUNTER — Other Ambulatory Visit: Payer: Self-pay

## 2021-03-05 VITALS — BP 113/83 | HR 97 | Temp 98.1°F | Resp 18 | Ht 70.0 in

## 2021-03-05 DIAGNOSIS — C109 Malignant neoplasm of oropharynx, unspecified: Secondary | ICD-10-CM

## 2021-03-05 DIAGNOSIS — R1310 Dysphagia, unspecified: Secondary | ICD-10-CM | POA: Diagnosis not present

## 2021-03-05 MED ORDER — DEXTROSE-NACL 5-0.45 % IV SOLN: Freq: Once | INTRAVENOUS | Status: AC

## 2021-03-05 MED ORDER — SODIUM CHLORIDE 0.9% FLUSH
10.0000 mL | Freq: Once | INTRAVENOUS | Status: DC | PRN
  Filled 2021-03-05: qty 10

## 2021-03-05 MED ORDER — PALONOSETRON HCL INJECTION 0.25 MG/5ML
INTRAVENOUS | Status: AC
  Filled 2021-03-05: qty 5

## 2021-03-05 MED ORDER — PALONOSETRON HCL INJECTION 0.25 MG/5ML
0.2500 mg | Freq: Once | INTRAVENOUS | Status: AC
Start: 2021-03-05 — End: 2021-03-05
  Administered 2021-03-05: 0.25 mg via INTRAVENOUS

## 2021-03-05 MED ORDER — HEPARIN SOD (PORK) LOCK FLUSH 100 UNIT/ML IV SOLN
500.0000 [IU] | Freq: Once | INTRAVENOUS | Status: AC | PRN
Start: 2021-03-05 — End: 2021-03-05
  Administered 2021-03-05: 500 [IU]
  Filled 2021-03-05: qty 5

## 2021-03-05 NOTE — Progress Notes (Signed)
Patient tolerated  IVF infusion well, today with Aloxi, no concerns voiced. Patient discharged. Stable.

## 2021-03-08 ENCOUNTER — Ambulatory Visit: Payer: Managed Care, Other (non HMO) | Admitting: Physical Therapy

## 2021-03-08 ENCOUNTER — Other Ambulatory Visit: Payer: Self-pay

## 2021-03-08 ENCOUNTER — Inpatient Hospital Stay: Payer: Managed Care, Other (non HMO)

## 2021-03-08 ENCOUNTER — Encounter: Payer: Self-pay | Admitting: Physical Therapy

## 2021-03-08 VITALS — BP 109/84 | HR 87 | Temp 99.0°F | Resp 18

## 2021-03-08 DIAGNOSIS — R293 Abnormal posture: Secondary | ICD-10-CM | POA: Insufficient documentation

## 2021-03-08 DIAGNOSIS — I890 Lymphedema, not elsewhere classified: Secondary | ICD-10-CM

## 2021-03-08 DIAGNOSIS — C109 Malignant neoplasm of oropharynx, unspecified: Secondary | ICD-10-CM

## 2021-03-08 DIAGNOSIS — L599 Disorder of the skin and subcutaneous tissue related to radiation, unspecified: Secondary | ICD-10-CM | POA: Insufficient documentation

## 2021-03-08 DIAGNOSIS — R1310 Dysphagia, unspecified: Secondary | ICD-10-CM | POA: Diagnosis not present

## 2021-03-08 DIAGNOSIS — C090 Malignant neoplasm of tonsillar fossa: Secondary | ICD-10-CM

## 2021-03-08 LAB — CBC WITH DIFFERENTIAL (CANCER CENTER ONLY)
Abs Immature Granulocytes: 0.03 10*3/uL (ref 0.00–0.07)
Basophils Absolute: 0 10*3/uL (ref 0.0–0.1)
Basophils Relative: 1 %
Eosinophils Absolute: 0 10*3/uL (ref 0.0–0.5)
Eosinophils Relative: 1 %
HCT: 30.2 % — ABNORMAL LOW (ref 39.0–52.0)
Hemoglobin: 10.4 g/dL — ABNORMAL LOW (ref 13.0–17.0)
Immature Granulocytes: 1 %
Lymphocytes Relative: 15 %
Lymphs Abs: 0.9 10*3/uL (ref 0.7–4.0)
MCH: 34 pg (ref 26.0–34.0)
MCHC: 34.4 g/dL (ref 30.0–36.0)
MCV: 98.7 fL (ref 80.0–100.0)
Monocytes Absolute: 0.5 10*3/uL (ref 0.1–1.0)
Monocytes Relative: 9 %
Neutro Abs: 4.1 10*3/uL (ref 1.7–7.7)
Neutrophils Relative %: 73 %
Platelet Count: 180 10*3/uL (ref 150–400)
RBC: 3.06 MIL/uL — ABNORMAL LOW (ref 4.22–5.81)
RDW: 17.1 % — ABNORMAL HIGH (ref 11.5–15.5)
WBC Count: 5.5 10*3/uL (ref 4.0–10.5)
nRBC: 0 % (ref 0.0–0.2)

## 2021-03-08 LAB — BASIC METABOLIC PANEL - CANCER CENTER ONLY
Anion gap: 8 (ref 5–15)
BUN: 17 mg/dL (ref 6–20)
CO2: 27 mmol/L (ref 22–32)
Calcium: 9.2 mg/dL (ref 8.9–10.3)
Chloride: 106 mmol/L (ref 98–111)
Creatinine: 0.98 mg/dL (ref 0.61–1.24)
GFR, Estimated: >60 mL/min (ref >60)
Glucose, Bld: 94 mg/dL (ref 70–99)
Potassium: 4.3 mmol/L (ref 3.5–5.1)
Sodium: 141 mmol/L (ref 135–145)

## 2021-03-08 LAB — MAGNESIUM: Magnesium: 2.1 mg/dL (ref 1.7–2.4)

## 2021-03-08 MED ORDER — DEXTROSE-NACL 5-0.45 % IV SOLN: Freq: Once | INTRAVENOUS | Status: AC

## 2021-03-08 MED ORDER — SODIUM CHLORIDE 0.9% FLUSH
10.0000 mL | Freq: Once | INTRAVENOUS | Status: AC | PRN
  Administered 2021-03-08: 10 mL

## 2021-03-08 MED ORDER — PALONOSETRON HCL INJECTION 0.25 MG/5ML
0.2500 mg | Freq: Once | INTRAVENOUS | Status: AC
  Administered 2021-03-08: 0.25 mg via INTRAVENOUS
  Filled 2021-03-08: qty 5

## 2021-03-08 MED ORDER — HEPARIN SOD (PORK) LOCK FLUSH 100 UNIT/ML IV SOLN
500.0000 [IU] | Freq: Once | INTRAVENOUS | Status: AC | PRN
  Administered 2021-03-08: 500 [IU]

## 2021-03-08 NOTE — Patient Instructions (Signed)

## 2021-03-08 NOTE — Therapy (Signed)
[place], [place], [address] Phone: [phone]   Fax:  [phone]  Physical Therapy Treatment  Patient Details  Name: Gerald [name] MRN: [id number] Date of Birth: 10/06/1970 Referring Provider (PT): Reita May Date: 03/08/2021   PT End of Session - 03/08/21 0957     Visit Number 3    Number of Visits 11    Date for PT Re-Evaluation 04/05/21    PT Start Time 0904    PT Stop Time 0953    PT Time Calculation (min) 49 min    Activity Tolerance Patient tolerated treatment well    Behavior During Therapy [facility] for tasks assessed/performed             Past Medical History:  Diagnosis Date   Cancer [facility])    Sleep apnea    Tonsillar mass 11/16/2020    Past Surgical History:  Procedure Laterality Date   DIRECT LARYNGOSCOPY N/A 11/22/2020   Procedure: DIRECT LARYNGOSCOPY WITH BIOPSY;  Surgeon: Leta Baptist, MD;  Location: [facility];  Service: ENT;  Laterality: N/A;   FOOT SURGERY  2000   extra nivicular removal   IR GASTROSTOMY TUBE MOD SED  12/24/2020   IR IMAGING GUIDED PORT INSERTION  12/24/2020   SPINE SURGERY     TONSILLECTOMY Left 11/22/2020   Procedure: LEFT TONSILLECTOMY;  Surgeon: Leta Baptist, MD;  Location: [facility];  Service: ENT;  Laterality: Left;    There were no vitals filed for this visit.   Subjective Assessment - 03/08/21 0905     Subjective I got my compression garment and I have been wearing it. Go ahead with the [place]. I think my swelling is ok.    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  Hospitalized x 12 days for side effects has been out x 1-2 weeks now.  Still losing weight.    Patient Stated Goals to gain info from provider    Currently in Pain? Yes    Pain Score 7     Pain Location Throat    Pain Orientation Left    Pain Descriptors / Indicators  Burning;Sore    Pain Type Acute pain    Pain Onset 1 to 4 weeks ago    Pain Frequency Constant    Aggravating Factors  swallowing    Pain Relieving Factors pain meds    Effect of Pain on Daily Activities can't eat or drink                               [facility] Adult PT Treatment/Exercise - 03/08/21 0001       Manual Therapy   Manual Therapy Manual Lymphatic Drainage (MLD)    Edema Management added 1/2 grey foam and small dot peach foam to [place] for additional compression at fron tofneck    Manual Lymphatic Drainage (MLD) In supine with head of bed elevated: short neck, 5 diaphragmatic breaths, bilateral axillary nodes, bilateral pectoral nodes, short neck, posterior, lateral, and anterior neck moving fluid towards pathways while instructing pt in basic principles of MLD - at end of session pt return demonstrated correct skin stretch with min verbal cues                         PT Long Term Goals - 02/18/21  [place] #1   Title Pt will return to baseline cervical ROM and not demonstrate any signs or symptoms of lymphedema    Status On-going      PT LONG TERM GOAL #2   Title Pt will be ind with self MLD for the neck and educated on flexitouch    Time 4    Period Weeks    Status New      PT LONG TERM GOAL #3   Title Pt will obtain appropriate compression for long term use    Time 4    Period Weeks    Status New                   Plan - 03/08/21 0957     Clinical Impression Statement Pt received his [place] head and neck garment and has been wearing it. He reports he felt like the chip pack was suffocating him and he could not tell any difference with wearing it. Added 1/2 grey foam and small dot peach foam to anterior neck in [place] for additional compression and pt reports this is tolerable. Continued with MLD today working on full [employer] while educating pt in technique and sequence. Pt able to  demonstrate correct skin stretch technique at end of session. Will issue handout at next session.    PT Frequency 2x / week    PT Duration 4 weeks    PT Treatment/Interventions ADLs/Self Care Home Management;Therapeutic exercise;Patient/family education;Manual techniques;DME Instruction;Compression bandaging;Manual lymph drainage;Vasopneumatic Device;Taping;Passive range of motion    PT Next Visit Plan continue MLD, how was foam and in [place]?    PT Home Exercise Plan head and neck ROM exercises    Consulted and Agree with Plan of Care Patient             Patient will benefit from skilled therapeutic intervention in order to improve the following deficits and impairments:  Postural dysfunction, Pain, Decreased knowledge of precautions, Increased edema  Visit Diagnosis: Lymphedema, not elsewhere classified  Disorder of the skin and subcutaneous tissue related to radiation, unspecified  Abnormal posture  Malignant neoplasm of tonsillar fossa [facility])     Problem List Patient Active Problem List   Diagnosis Date Noted   Intractable nausea and vomiting 01/29/2021   Dehydration 01/29/2021   Pancytopenia ([place]) 01/29/2021   GERD (gastroesophageal reflux disease) 01/29/2021   Weight loss, unintentional 01/04/2021   Tinnitus 01/04/2021   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

## 2021-03-09 ENCOUNTER — Encounter: Payer: Self-pay | Admitting: Hematology and Oncology

## 2021-03-10 ENCOUNTER — Inpatient Hospital Stay: Payer: Managed Care, Other (non HMO)

## 2021-03-10 ENCOUNTER — Ambulatory Visit: Payer: Managed Care, Other (non HMO) | Attending: Radiation Oncology

## 2021-03-10 ENCOUNTER — Other Ambulatory Visit: Payer: Self-pay

## 2021-03-10 VITALS — BP 123/87 | HR 93 | Temp 98.6°F | Resp 18

## 2021-03-10 DIAGNOSIS — R1310 Dysphagia, unspecified: Secondary | ICD-10-CM | POA: Insufficient documentation

## 2021-03-10 DIAGNOSIS — C109 Malignant neoplasm of oropharynx, unspecified: Secondary | ICD-10-CM

## 2021-03-10 MED ORDER — PALONOSETRON HCL INJECTION 0.25 MG/5ML
0.2500 mg | Freq: Once | INTRAVENOUS | Status: AC
  Administered 2021-03-10: 0.25 mg via INTRAVENOUS
  Filled 2021-03-10: qty 5

## 2021-03-10 MED ORDER — HEPARIN SOD (PORK) LOCK FLUSH 100 UNIT/ML IV SOLN
500.0000 [IU] | Freq: Once | INTRAVENOUS | Status: AC | PRN
  Administered 2021-03-10: 500 [IU]

## 2021-03-10 MED ORDER — DEXTROSE-NACL 5-0.45 % IV SOLN: Freq: Once | INTRAVENOUS | Status: AC

## 2021-03-10 MED ORDER — SODIUM CHLORIDE 0.9% FLUSH
10.0000 mL | Freq: Once | INTRAVENOUS | Status: AC | PRN
  Administered 2021-03-10: 10 mL

## 2021-03-10 NOTE — Therapy (Signed)
[facility] [address] [place], [place], [address] Phone: [phone]   Fax:  [phone]  Speech Language Pathology Treatment  Patient Details  Name: Gerald [name] MRN: [id number] Date of Birth: [birth year] Referring Provider (SLP): Eppie Gibson, MD   Encounter Date: 03/10/2021   End of Session - 03/10/21 1107     Visit Number 3    Number of Visits 4    Date for SLP Re-Evaluation 03/30/21    SLP Start Time 0955    SLP Stop Time  1038    SLP Time Calculation (min) 43 min    Activity Tolerance Patient tolerated treatment well             Past Medical History:  Diagnosis Date   Cancer ([place])    Sleep apnea    Tonsillar mass 11/16/2020    Past Surgical History:  Procedure Laterality Date   DIRECT LARYNGOSCOPY N/A 11/22/2020   Procedure: DIRECT LARYNGOSCOPY WITH BIOPSY;  Surgeon: Leta Baptist, MD;  Location: [facility];  Service: ENT;  Laterality: N/A;   FOOT SURGERY  2000   extra nivicular removal   IR GASTROSTOMY TUBE MOD SED  12/24/2020   IR IMAGING GUIDED PORT INSERTION  12/24/2020   SPINE SURGERY     TONSILLECTOMY Left 11/22/2020   Procedure: LEFT TONSILLECTOMY;  Surgeon: Leta Baptist, MD;  Location: [facility];  Service: ENT;  Laterality: Left;    There were no vitals filed for this visit.   Subjective Assessment - 03/10/21 0956     Subjective "My soft palate is starting to heal up but where my tonsil was is still really sore."    Currently in Pain? Yes    Pain Score 7     Pain Location Throat   oropharynx   Pain Orientation Left    Pain Descriptors / Indicators Sore;Tender;Burning;Other (Comment)   "tearing (you know like it's coming apart)"   Pain Type Acute pain    Pain Onset 1 to 4 weeks ago                   ADULT SLP TREATMENT - 03/10/21 1001       General Information   Behavior/Cognition Alert;Cooperative;Pleasant mood      Treatment Provided    Treatment provided Dysphagia      Dysphagia Treatment   Temperature Spikes Noted No    Respiratory Status Room air    Oral Cavity - Dentition Adequate natural dentition    Patient observed directly with PO's Yes    Type of PO's observed Thin liquids;Dysphagia 1 (puree)    Liquids provided via Cup    Oral Phase Signs & Symptoms --   none noted   Pharyngeal Phase Signs & Symptoms Delayed throat clear   20% swallows; ? due to thihck phlegm   Other treatment/comments Pt throat clearing/expectorating thick mucous throughout session. SLP encouraged pt to swallow instead but he politely refused stating, "I'll vomit if I don't do that." SLP suggested pt try to swallow secretions as soon as he feels he can. Gerald [name] has ben performing Mendelsohn and Shaker most routinely with suboptimal frequency. SLP encouraged pt again to use ptich raise and chin-fist (CTAR) as he cont to heal from rad tx and dealing with throat/posterior oral cavity pain. HEP produced with excellent procedure, rare min A necessary. Pt told SLP rationale for HEP.      Assessment / Recommendations / Plan   Plan Continue with  current plan of care      Dysphagia Recommendations   Diet recommendations Dysphagia 1 (puree);Thin liquid    Liquids provided via Cup    Medication Administration --   as tolerated   Compensations Small sips/bites;Follow solids with liquid   alternate bite/sip if necessary             SLP Education - 03/10/21 1107     Education Details CTAR    Person(s) Educated Patient    Methods Explanation;Demonstration    Comprehension Verbalized understanding;Returned demonstration              SLP Short Term Goals - 03/10/21 1108       SLP SHORT TERM GOAL #1   Title pt will tell SLP why pt is completing HEP with modified independence    Period --   visits, for all STGs   Status Achieved      SLP SHORT TERM GOAL #2   Title pt will complete HEP with rare min A in 2 sessions    Baseline 03-10-21     Status Partially Met      SLP SHORT TERM GOAL #3   Title pt will describe 3 overt s/s aspiration PNA with modified independence    Time 1    Status On-going      SLP SHORT TERM GOAL #4   Title pt will tell SLP how a food journal could hasten return to a more normalized diet    Time 1    Status On-going              SLP Long Term Goals - 03/10/21 1109       SLP LONG TERM GOAL #1   Title pt will complete HEP with modified independence over tree visits    Time 2    Period --   visits, for all LTGs   Status On-going      SLP LONG TERM GOAL #2   Title pt will complete HEP with independence over two visits    Time 3    Status On-going      SLP LONG TERM GOAL #3   Title pt will describe how to modify HEP over time, and the timeline associated with reduction in HEP frequency with modified independence over two sessions    Time 4    Status On-going              Plan - 03/10/21 1108     Clinical Impression Statement Pt appears safe with puree and thin liquids with smaller sips and bite sizes. Encouraged pt to have puree on regular basis. SLP reveiewed pt's individualized HEP for dysphagia however pt did not complete due to n/v. There are no overt s/s aspiration PNA observed, and none reported by pt at this time. Data indicate that pt's swallow ability will likely decrease over the course of radiation therapy and could very well decline over time following conclusion of their radiation therapy due to muscle disuse atrophy and/or muscle fibrosis. Pt will cont to need to be seen by SLP in order to assess safety of PO intake, assess the need for recommending any objective swallow assessment, and ensuring pt correctly completes the individualized HEP.    Speech Therapy Frequency --   once approx every 4 weeks   Duration --   90 days/12 weeks   Treatment/Interventions Aspiration precaution training;Pharyngeal strengthening exercises;Diet toleration management by SLP;Trials of upgraded  texture/liquids;Internal/external aids;Patient/family education;SLP instruction and feedback;Compensatory strategies  Potential to Achieve Goals Good    SLP Home Exercise Plan provided today    Consulted and Agree with Plan of Care Patient             Patient will benefit from skilled therapeutic intervention in order to improve the following deficits and impairments:   Dysphagia, unspecified type    Problem List Patient Active Problem List   Diagnosis Date Noted   Intractable nausea and vomiting 01/29/2021   Dehydration 01/29/2021   Pancytopenia ([place]) 01/29/2021   GERD (gastroesophageal reflux disease) 01/29/2021   Weight loss, unintentional 01/04/2021   Tinnitus 01/04/2021   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 ,[place], [place]  03/10/2021, 11:10 AM  [facility] [address] [place], [place], [address] Phone: [phone]   Fax:  [phone]   Name: Jameal Razzano MRN: [id number] Date of Birth: 1970-11-04

## 2021-03-10 NOTE — Patient Instructions (Signed)

## 2021-03-12 ENCOUNTER — Other Ambulatory Visit: Payer: Self-pay

## 2021-03-12 ENCOUNTER — Inpatient Hospital Stay: Payer: Managed Care, Other (non HMO)

## 2021-03-12 VITALS — BP 99/79 | HR 75 | Temp 97.7°F | Resp 17 | Ht 70.0 in

## 2021-03-12 DIAGNOSIS — R1310 Dysphagia, unspecified: Secondary | ICD-10-CM | POA: Diagnosis not present

## 2021-03-12 DIAGNOSIS — C109 Malignant neoplasm of oropharynx, unspecified: Secondary | ICD-10-CM

## 2021-03-12 MED ORDER — PALONOSETRON HCL INJECTION 0.25 MG/5ML
0.2500 mg | Freq: Once | INTRAVENOUS | Status: AC
  Administered 2021-03-12: 0.25 mg via INTRAVENOUS

## 2021-03-12 MED ORDER — HEPARIN SOD (PORK) LOCK FLUSH 100 UNIT/ML IV SOLN
500.0000 [IU] | Freq: Once | INTRAVENOUS | Status: AC | PRN
  Administered 2021-03-12: 500 [IU]

## 2021-03-12 MED ORDER — SODIUM CHLORIDE 0.9% FLUSH
10.0000 mL | Freq: Once | INTRAVENOUS | Status: AC | PRN
  Administered 2021-03-12: 10 mL

## 2021-03-12 MED ORDER — PALONOSETRON HCL INJECTION 0.25 MG/5ML
INTRAVENOUS | Status: AC
  Filled 2021-03-12: qty 5

## 2021-03-12 MED ORDER — DEXTROSE-NACL 5-0.45 % IV SOLN: Freq: Once | INTRAVENOUS | Status: AC

## 2021-03-12 NOTE — Patient Instructions (Signed)

## 2021-03-15 ENCOUNTER — Other Ambulatory Visit: Payer: Self-pay

## 2021-03-15 ENCOUNTER — Inpatient Hospital Stay: Payer: Managed Care, Other (non HMO)

## 2021-03-15 VITALS — BP 108/80 | HR 88 | Temp 98.7°F | Resp 18

## 2021-03-15 DIAGNOSIS — R1310 Dysphagia, unspecified: Secondary | ICD-10-CM | POA: Diagnosis not present

## 2021-03-15 DIAGNOSIS — C109 Malignant neoplasm of oropharynx, unspecified: Secondary | ICD-10-CM

## 2021-03-15 MED ORDER — DEXTROSE-NACL 5-0.45 % IV SOLN: Freq: Once | INTRAVENOUS | Status: AC

## 2021-03-15 MED ORDER — SODIUM CHLORIDE 0.9% FLUSH
10.0000 mL | Freq: Once | INTRAVENOUS | Status: AC | PRN
  Administered 2021-03-15: 10 mL

## 2021-03-15 MED ORDER — HYDROCODONE-ACETAMINOPHEN 7.5-325 MG/15ML PO SOLN: 15.0000 mL | Freq: Four times a day (QID) | ORAL | 0 refills | Status: DC | PRN

## 2021-03-15 MED ORDER — PALONOSETRON HCL INJECTION 0.25 MG/5ML
0.2500 mg | Freq: Once | INTRAVENOUS | Status: AC
  Administered 2021-03-15: 0.25 mg via INTRAVENOUS
  Filled 2021-03-15: qty 5

## 2021-03-15 MED ORDER — HEPARIN SOD (PORK) LOCK FLUSH 100 UNIT/ML IV SOLN
500.0000 [IU] | Freq: Once | INTRAVENOUS | Status: AC | PRN
  Administered 2021-03-15: 500 [IU]

## 2021-03-15 NOTE — Patient Instructions (Signed)

## 2021-03-16 ENCOUNTER — Ambulatory Visit: Payer: Managed Care, Other (non HMO) | Admitting: Physical Therapy

## 2021-03-16 ENCOUNTER — Encounter: Payer: Self-pay | Admitting: Physical Therapy

## 2021-03-16 DIAGNOSIS — L599 Disorder of the skin and subcutaneous tissue related to radiation, unspecified: Secondary | ICD-10-CM

## 2021-03-16 DIAGNOSIS — R1310 Dysphagia, unspecified: Secondary | ICD-10-CM | POA: Diagnosis not present

## 2021-03-16 DIAGNOSIS — C090 Malignant neoplasm of tonsillar fossa: Secondary | ICD-10-CM

## 2021-03-16 DIAGNOSIS — I890 Lymphedema, not elsewhere classified: Secondary | ICD-10-CM

## 2021-03-16 DIAGNOSIS — R293 Abnormal posture: Secondary | ICD-10-CM

## 2021-03-16 NOTE — Therapy (Signed)
[place], [place], [address] Phone: [phone]   Fax:  [phone]  Physical Therapy Treatment  Patient Details  Name: Gerald [name] MRN: [id number] Date of Birth: Sep 01, 1970 Referring Provider (PT): Reita May Date: 03/16/2021   PT End of Session - 03/16/21 1055     Visit Number 4    Number of Visits 11    Date for PT Re-Evaluation 04/05/21    PT Start Time 1007    PT Stop Time 1054    PT Time Calculation (min) 47 min    Activity Tolerance Patient tolerated treatment well    Behavior During Therapy [facility] for tasks assessed/performed             Past Medical History:  Diagnosis Date   Cancer [facility])    Sleep apnea    Tonsillar mass 11/16/2020    Past Surgical History:  Procedure Laterality Date   DIRECT LARYNGOSCOPY N/A 11/22/2020   Procedure: DIRECT LARYNGOSCOPY WITH BIOPSY;  Surgeon: Leta Baptist, MD;  Location: [place];  Service: ENT;  Laterality: N/A;   FOOT SURGERY  2000   extra nivicular removal   IR GASTROSTOMY TUBE MOD SED  12/24/2020   IR IMAGING GUIDED PORT INSERTION  12/24/2020   SPINE SURGERY     TONSILLECTOMY Left 11/22/2020   Procedure: LEFT TONSILLECTOMY;  Surgeon: Leta Baptist, MD;  Location: [place];  Service: ENT;  Laterality: Left;    There were no vitals filed for this visit.   Subjective Assessment - 03/16/21 1008     Subjective I am doing really well. The snot and secretions have stopped with medication and I am able to drink water. I am able to have a little bit of applesauce.    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  Hospitalized x 12 days for side effects has been out x 1-2 weeks now.  Still losing weight.    Patient Stated Goals to gain info from provider    Currently in Pain? Yes    Pain Score 7     Pain Location Throat    Pain Orientation Left     Pain Descriptors / Indicators Sore;Tender;Burning    Pain Type Acute pain    Pain Onset 1 to 4 weeks ago    Pain Frequency Constant    Aggravating Factors  swallowing    Pain Relieving Factors pain meds    Effect of Pain on Daily Activities difficult to swallow, eat or drink                               [facility] Adult PT Treatment/Exercise - 03/16/21 0001       Manual Therapy   Manual Lymphatic Drainage (MLD) In supine with head of bed elevated: short neck, 5 diaphragmatic breaths, bilateral axillary nodes, bilateral pectoral nodes, short neck, posterior, lateral, and anterior neck moving fluid towards pathways                         PT Long Term Goals - 02/18/21 1059       PT LONG TERM GOAL #1   Title Pt will return to baseline cervical ROM and not demonstrate any signs or symptoms of lymphedema    Status On-going      PT LONG TERM GOAL #  2   Title Pt will be ind with self MLD for the neck and educated on flexitouch    Time 4    Period Weeks    Status New      PT LONG TERM GOAL #3   Title Pt will obtain appropriate compression for long term use    Time 4    Period Weeks    Status New                   Plan - 03/16/21 1056     Clinical Impression Statement Pt reports he is only able to wear the [place] with added foam about 2 hours before he feels like it is too much. Continued with MLD today to anterior neck and by end of session there was a noticeable decrease in edema in anterior neck. Pt had the demo of the flexitouch and reports it went well and he could tell a difference immediately.    PT Frequency 2x / week    PT Duration 4 weeks    PT Treatment/Interventions ADLs/Self Care Home Management;Therapeutic exercise;Patient/family education;Manual techniques;DME Instruction;Compression bandaging;Manual lymph drainage;Vasopneumatic Device;Taping;Passive range of motion    PT Next Visit Plan continue MLD, how long is he  able to wear marena    PT Home Exercise Plan head and neck ROM exercises, self MLD    Consulted and Agree with Plan of Care Patient             Patient will benefit from skilled therapeutic intervention in order to improve the following deficits and impairments:  Postural dysfunction, Pain, Decreased knowledge of precautions, Increased edema  Visit Diagnosis: Lymphedema, not elsewhere classified  Disorder of the skin and subcutaneous tissue related to radiation, unspecified  Abnormal posture  Malignant neoplasm of tonsillar fossa [facility])     Problem List Patient Active Problem List   Diagnosis Date Noted   Intractable nausea and vomiting 01/29/2021   Dehydration 01/29/2021   Pancytopenia ([place]) 01/29/2021   GERD (gastroesophageal reflux disease) 01/29/2021   Weight loss, unintentional 01/04/2021   Tinnitus 01/04/2021   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

## 2021-03-17 ENCOUNTER — Telehealth: Payer: Self-pay | Admitting: Nutrition

## 2021-03-17 ENCOUNTER — Encounter: Payer: Managed Care, Other (non HMO) | Admitting: Nutrition

## 2021-03-17 ENCOUNTER — Inpatient Hospital Stay: Payer: Managed Care, Other (non HMO)

## 2021-03-17 ENCOUNTER — Other Ambulatory Visit: Payer: Self-pay

## 2021-03-17 VITALS — BP 107/73 | HR 67 | Temp 99.1°F | Resp 18 | Wt 213.0 lb

## 2021-03-17 DIAGNOSIS — R1310 Dysphagia, unspecified: Secondary | ICD-10-CM | POA: Diagnosis not present

## 2021-03-17 DIAGNOSIS — C109 Malignant neoplasm of oropharynx, unspecified: Secondary | ICD-10-CM

## 2021-03-17 LAB — BASIC METABOLIC PANEL - CANCER CENTER ONLY
Anion gap: 6 (ref 5–15)
BUN: 13 mg/dL (ref 6–20)
CO2: 29 mmol/L (ref 22–32)
Calcium: 9.1 mg/dL (ref 8.9–10.3)
Chloride: 105 mmol/L (ref 98–111)
Creatinine: 0.87 mg/dL (ref 0.61–1.24)
GFR, Estimated: >60 mL/min (ref >60)
Glucose, Bld: 101 mg/dL — ABNORMAL HIGH (ref 70–99)
Potassium: 4.2 mmol/L (ref 3.5–5.1)
Sodium: 140 mmol/L (ref 135–145)

## 2021-03-17 LAB — CBC WITH DIFFERENTIAL/PLATELET
Abs Immature Granulocytes: 0.03 10*3/uL (ref 0.00–0.07)
Basophils Absolute: 0.1 10*3/uL (ref 0.0–0.1)
Basophils Relative: 1 %
Eosinophils Absolute: 0.1 10*3/uL (ref 0.0–0.5)
Eosinophils Relative: 3 %
HCT: 32.5 % — ABNORMAL LOW (ref 39.0–52.0)
Hemoglobin: 10.8 g/dL — ABNORMAL LOW (ref 13.0–17.0)
Immature Granulocytes: 1 %
Lymphocytes Relative: 13 %
Lymphs Abs: 0.7 10*3/uL (ref 0.7–4.0)
MCH: 33.4 pg (ref 26.0–34.0)
MCHC: 33.2 g/dL (ref 30.0–36.0)
MCV: 100.6 fL — ABNORMAL HIGH (ref 80.0–100.0)
Monocytes Absolute: 0.5 10*3/uL (ref 0.1–1.0)
Monocytes Relative: 8 %
Neutro Abs: 4 10*3/uL (ref 1.7–7.7)
Neutrophils Relative %: 74 %
Platelets: 177 10*3/uL (ref 150–400)
RBC: 3.23 MIL/uL — ABNORMAL LOW (ref 4.22–5.81)
RDW: 15.6 % — ABNORMAL HIGH (ref 11.5–15.5)
WBC: 5.4 10*3/uL (ref 4.0–10.5)
nRBC: 0 % (ref 0.0–0.2)

## 2021-03-17 LAB — MAGNESIUM: Magnesium: 1.9 mg/dL (ref 1.7–2.4)

## 2021-03-17 MED ORDER — HEPARIN SOD (PORK) LOCK FLUSH 100 UNIT/ML IV SOLN
500.0000 [IU] | Freq: Once | INTRAVENOUS | Status: AC | PRN
  Administered 2021-03-17: 500 [IU]

## 2021-03-17 MED ORDER — DEXTROSE-NACL 5-0.45 % IV SOLN: Freq: Once | INTRAVENOUS | Status: AC

## 2021-03-17 MED ORDER — PALONOSETRON HCL INJECTION 0.25 MG/5ML
0.2500 mg | Freq: Once | INTRAVENOUS | Status: AC
  Administered 2021-03-17: 0.25 mg via INTRAVENOUS
  Filled 2021-03-17: qty 5

## 2021-03-17 MED ORDER — SODIUM CHLORIDE 0.9% FLUSH
10.0000 mL | Freq: Once | INTRAVENOUS | Status: AC | PRN
  Administered 2021-03-17: 10 mL

## 2021-03-17 NOTE — Telephone Encounter (Signed)
Nutrition follow-up completed over the telephone.  Patient has completed chemoradiation therapy for tonsil cancer.  Patient reports he has made a turn for the better.  He is able to eat applesauce and part of a breakfast burrito.  He drinks water and Gatorade. Reports his throat continues to be sore but he is now swallowing his saliva.  He is up out of bed and walking his dog daily.  He uses 3 [employer] 1.5 peptide via feeding tube daily.  Reports weight is stable at 213 pounds August 25.  He continues to get IV fluids and Aloxi.  Reports he had some nausea after smelling fish.  Estimated nutrition needs: 2600-2800 cal, 130-145 g protein, 2.8 L fluid.  3 cartons [employer] 1.5 peptide provides 1500 cal, 72 g protein.  Nutrition diagnosis: Inadequate oral intake improving. Severe malnutrition is stable.  Intervention: Educated patient on strategies for continuing increased oral intake and gave examples of soft moist foods. Continue [employer] 1.5 via feeding tube.  Begin decreasing [place] as oral intake increases while maintaining current weight. Questions were answered.  Teach back method used.  Monitoring, evaluation, goals: Patient will tolerate adequate calories and protein for weight stability and continued healing.  Next visit: Telephone follow-up in approximately 1 month.  **Disclaimer: This note was dictated with voice recognition software. Similar sounding words can inadvertently be transcribed and this note may contain transcription errors which may not have been corrected upon publication of note.**

## 2021-03-18 ENCOUNTER — Encounter: Payer: Self-pay | Admitting: Physical Therapy

## 2021-03-18 ENCOUNTER — Ambulatory Visit: Payer: Managed Care, Other (non HMO) | Admitting: Physical Therapy

## 2021-03-18 DIAGNOSIS — R293 Abnormal posture: Secondary | ICD-10-CM

## 2021-03-18 DIAGNOSIS — R1310 Dysphagia, unspecified: Secondary | ICD-10-CM | POA: Diagnosis not present

## 2021-03-18 DIAGNOSIS — I890 Lymphedema, not elsewhere classified: Secondary | ICD-10-CM

## 2021-03-18 DIAGNOSIS — C090 Malignant neoplasm of tonsillar fossa: Secondary | ICD-10-CM

## 2021-03-18 DIAGNOSIS — L599 Disorder of the skin and subcutaneous tissue related to radiation, unspecified: Secondary | ICD-10-CM

## 2021-03-18 NOTE — Therapy (Signed)
[place], [place], [address] Phone: [phone]   Fax:  [phone]  Physical Therapy Treatment  Patient Details  Name: Gerald [name] MRN: [id number] Date of Birth: 1970-11-23 Referring Provider (PT): Reita May Date: 03/18/2021   PT End of Session - 03/18/21 0957     Visit Number 5    Number of Visits 11    Date for PT Re-Evaluation 04/05/21    PT Start Time 0952    PT Stop Time [id number]    PT Time Calculation (min) 52 min    Activity Tolerance Patient tolerated treatment well    Behavior During Therapy [facility] for tasks assessed/performed             Past Medical History:  Diagnosis Date   Cancer [facility])    Sleep apnea    Tonsillar mass 11/16/2020    Past Surgical History:  Procedure Laterality Date   DIRECT LARYNGOSCOPY N/A 11/22/2020   Procedure: DIRECT LARYNGOSCOPY WITH BIOPSY;  Surgeon: Leta Baptist, MD;  Location: [place];  Service: ENT;  Laterality: N/A;   FOOT SURGERY  2000   extra nivicular removal   IR GASTROSTOMY TUBE MOD SED  12/24/2020   IR IMAGING GUIDED PORT INSERTION  12/24/2020   SPINE SURGERY     TONSILLECTOMY Left 11/22/2020   Procedure: LEFT TONSILLECTOMY;  Surgeon: Leta Baptist, MD;  Location: [place];  Service: ENT;  Laterality: Left;    There were no vitals filed for this visit.   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  Hospitalized x 12 days for side effects has been out x 1-2 weeks now.  Still losing weight.    Patient Stated Goals to gain info from provider    Currently in Pain? Yes    Pain Score 7     Pain Location Throat     Pain Descriptors / Indicators Sore    Pain Type Acute pain    Pain Onset 1 to 4 weeks ago    Pain Frequency Intermittent    Aggravating Factors  [place]                               [facility] Adult PT Treatment/Exercise - 03/18/21 0001       Manual Therapy   Manual Lymphatic Drainage (MLD) In supine with head of bed elevated: short neck, 5 diaphragmatic breaths, bilateral axillary nodes, bilateral pectoral nodes, short neck, posterior, lateral, and anterior neck moving fluid towards pathways Extra time spent on lateral neck, ears and cheeks with extra time around right ear                         PT Long Term Goals - 02/18/21 1059       PT LONG TERM GOAL #1   Title Pt will return to baseline cervical ROM and not demonstrate any signs or symptoms of lymphedema    Status On-going      PT LONG TERM GOAL #2   Title Pt will be ind  with self MLD for the neck and educated on flexitouch    Time 4    Period Weeks    Status New      PT LONG TERM GOAL #3   Title Pt will obtain appropriate compression for long term use    Time 4    Period Weeks    Status New                   Plan - 03/18/21 1055     Clinical Impression Statement Pt reported that he could the neck was reduced after treatement. He still has fullness in lateral neck and around ear as well as anterior neck but is responding to treatment.    Personal Factors and Comorbidities Comorbidity 2    Comorbidities recent chemotherapy and radiation, recenet hospitalzation    Examination-Participation Restrictions Occupation    Stability/Clinical Decision Making Stable/Uncomplicated    Rehab Potential Good    PT Frequency 2x / week    PT Duration 4 weeks    PT Treatment/Interventions ADLs/Self Care Home Management;Therapeutic exercise;Patient/family education;Manual techniques;DME Instruction;Compression bandaging;Manual lymph drainage;Vasopneumatic Device;Taping;Passive range of motion     PT Next Visit Plan continue MLD with extra time at lateral neck and around ear especially on right  did he get Flexitouch? how long is he able to wear marena    PT Home Exercise Plan head and neck ROM exercises, self MLD    Consulted and Agree with Plan of Care Patient             Patient will benefit from skilled therapeutic intervention in order to improve the following deficits and impairments:  Postural dysfunction, Pain, Decreased knowledge of precautions, Increased edema  Visit Diagnosis: Lymphedema, not elsewhere classified  Disorder of the skin and subcutaneous tissue related to radiation, unspecified  Abnormal posture  Malignant neoplasm of tonsillar fossa [facility])     Problem List Patient Active Problem List   Diagnosis Date Noted   Intractable nausea and vomiting 01/29/2021   Dehydration 01/29/2021   Pancytopenia ([place]) 01/29/2021   GERD (gastroesophageal reflux disease) 01/29/2021   Weight loss, unintentional 01/04/2021   Tinnitus 01/04/2021   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 Owens Shark PT  Norwood Levo 03/18/2021, 10:59 AM  [place], [place], [address] Phone: [phone]   Fax:  [phone]  Name: Gerald [name] MRN: [id number] Date of Birth: 09-25-1970

## 2021-03-19 ENCOUNTER — Other Ambulatory Visit: Payer: Self-pay

## 2021-03-19 ENCOUNTER — Inpatient Hospital Stay: Payer: Managed Care, Other (non HMO)

## 2021-03-19 VITALS — BP 109/75 | HR 78 | Temp 98.6°F | Resp 20

## 2021-03-19 DIAGNOSIS — C109 Malignant neoplasm of oropharynx, unspecified: Secondary | ICD-10-CM

## 2021-03-19 DIAGNOSIS — R1310 Dysphagia, unspecified: Secondary | ICD-10-CM | POA: Diagnosis not present

## 2021-03-19 MED ORDER — PALONOSETRON HCL INJECTION 0.25 MG/5ML
INTRAVENOUS | Status: AC
  Filled 2021-03-19: qty 5

## 2021-03-19 MED ORDER — PALONOSETRON HCL INJECTION 0.25 MG/5ML
0.2500 mg | Freq: Once | INTRAVENOUS | Status: AC
  Administered 2021-03-19: 0.25 mg via INTRAVENOUS

## 2021-03-19 MED ORDER — HEPARIN SOD (PORK) LOCK FLUSH 100 UNIT/ML IV SOLN
500.0000 [IU] | Freq: Once | INTRAVENOUS | Status: AC | PRN
  Administered 2021-03-19: 500 [IU]

## 2021-03-19 MED ORDER — DEXTROSE-NACL 5-0.45 % IV SOLN
Freq: Once | INTRAVENOUS | Status: AC
Start: 2021-03-19 — End: 2021-03-19

## 2021-03-19 MED ORDER — SODIUM CHLORIDE 0.9% FLUSH
10.0000 mL | Freq: Once | INTRAVENOUS | Status: AC | PRN
  Administered 2021-03-19: 10 mL

## 2021-03-21 ENCOUNTER — Other Ambulatory Visit: Payer: Self-pay

## 2021-03-21 ENCOUNTER — Ambulatory Visit: Payer: Managed Care, Other (non HMO) | Admitting: Physical Therapy

## 2021-03-21 ENCOUNTER — Encounter: Payer: Self-pay | Admitting: Physical Therapy

## 2021-03-21 DIAGNOSIS — R1310 Dysphagia, unspecified: Secondary | ICD-10-CM | POA: Diagnosis not present

## 2021-03-21 DIAGNOSIS — R293 Abnormal posture: Secondary | ICD-10-CM

## 2021-03-21 DIAGNOSIS — L599 Disorder of the skin and subcutaneous tissue related to radiation, unspecified: Secondary | ICD-10-CM

## 2021-03-21 DIAGNOSIS — I890 Lymphedema, not elsewhere classified: Secondary | ICD-10-CM

## 2021-03-21 DIAGNOSIS — C090 Malignant neoplasm of tonsillar fossa: Secondary | ICD-10-CM

## 2021-03-21 NOTE — Therapy (Signed)
[place], [place], [address] Phone: [phone]   Fax:  [phone]  Physical Therapy Treatment  Patient Details  Name: Gerald [name] MRN: [id number] Date of Birth: 1970-10-07 Referring Provider (PT): Reita May Date: 03/21/2021   PT End of Session - 03/21/21 1500     Visit Number 6    Number of Visits 11    Date for PT Re-Evaluation 04/05/21    PT Start Time 1406    PT Stop Time [id number]    PT Time Calculation (min) 51 min    Activity Tolerance Patient tolerated treatment well    Behavior During Therapy [facility] for tasks assessed/performed             Past Medical History:  Diagnosis Date   Cancer [facility])    Sleep apnea    Tonsillar mass 11/16/2020    Past Surgical History:  Procedure Laterality Date   DIRECT LARYNGOSCOPY N/A 11/22/2020   Procedure: DIRECT LARYNGOSCOPY WITH BIOPSY;  Surgeon: Leta Baptist, MD;  Location: [place];  Service: ENT;  Laterality: N/A;   FOOT SURGERY  2000   extra nivicular removal   IR GASTROSTOMY TUBE MOD SED  12/24/2020   IR IMAGING GUIDED PORT INSERTION  12/24/2020   SPINE SURGERY     TONSILLECTOMY Left 11/22/2020   Procedure: LEFT TONSILLECTOMY;  Surgeon: Leta Baptist, MD;  Location: [place];  Service: ENT;  Laterality: Left;    There were no vitals filed for this visit.   Subjective Assessment - 03/21/21 1407     Subjective I feel like my swelling in about the same. I need to be wearing the [facility] more often. I am entirely on solid foods for the last couple of days.    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  Hospitalized x 12 days for side effects has been out x 1-2 weeks now.  Still losing weight.    Patient Stated Goals to gain info from provider    Currently in Pain? Yes    Pain Score 6     Pain Location Throat    Pain Orientation Left    Pain  Descriptors / Indicators Sore    Pain Type Acute pain    Pain Onset 1 to 4 weeks ago    Pain Frequency Intermittent    Aggravating Factors  swallowing    Pain Relieving Factors pain meds    Effect of Pain on Daily Activities difficult to swallow                               [facility] Adult PT Treatment/Exercise - 03/21/21 0001       Manual Therapy   Manual Lymphatic Drainage (MLD) In supine with head of bed elevated: short neck, 5 diaphragmatic breaths, bilateral axillary nodes, bilateral pectoral nodes, short neck, posterior, lateral, and anterior neck moving fluid towards pathways                         PT Long Term Goals - 02/18/21 1059       PT LONG TERM GOAL #1   Title Pt will return to baseline cervical ROM and not demonstrate any signs or symptoms of lymphedema    Status On-going      PT LONG TERM GOAL #2  Title Pt will be ind with self MLD for the neck and educated on flexitouch    Time 4    Period Weeks    Status New      PT LONG TERM GOAL #3   Title Pt will obtain appropriate compression for long term use    Time 4    Period Weeks    Status New                   Plan - 03/21/21 1500     Clinical Impression Statement Pt reports he has been wearing his [place] for about 2 hours a day but will try to increase that to 4 hours a day with a break after 2 hours. Continued with MLD to anterior neck today with good reduction noted by end of treatment session. Pt to go home and wear his garment to help to continue to reduce edema.    PT Frequency 2x / week    PT Duration 4 weeks    PT Treatment/Interventions ADLs/Self Care Home Management;Therapeutic exercise;Patient/family education;Manual techniques;DME Instruction;Compression bandaging;Manual lymph drainage;Vasopneumatic Device;Taping;Passive range of motion    PT Next Visit Plan continue MLD with extra time at lateral neck especially on right  did he get Flexitouch?  how long is he able to wear marena    PT Home Exercise Plan head and neck ROM exercises, self MLD    Consulted and Agree with Plan of Care Patient             Patient will benefit from skilled therapeutic intervention in order to improve the following deficits and impairments:  Postural dysfunction, Pain, Decreased knowledge of precautions, Increased edema  Visit Diagnosis: Lymphedema, not elsewhere classified  Disorder of the skin and subcutaneous tissue related to radiation, unspecified  Abnormal posture  Malignant neoplasm of tonsillar fossa [facility])     Problem List Patient Active Problem List   Diagnosis Date Noted   Intractable nausea and vomiting 01/29/2021   Dehydration 01/29/2021   Pancytopenia ([place]) 01/29/2021   GERD (gastroesophageal reflux disease) 01/29/2021   Weight loss, unintentional 01/04/2021   Tinnitus 01/04/2021   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 03/21/2021, 3:02 PM  [place], [place], [address] Phone: [phone]   Fax:  [phone]  Name: Gerald [name] MRN: [id number] Date of Birth: 12/23/1970   Manus Gunning, PT 03/21/21 3:03 PM

## 2021-03-22 ENCOUNTER — Inpatient Hospital Stay: Payer: Managed Care, Other (non HMO)

## 2021-03-22 ENCOUNTER — Encounter: Payer: Managed Care, Other (non HMO) | Admitting: Dietician

## 2021-03-22 VITALS — BP 110/78 | HR 69 | Temp 98.9°F | Resp 18

## 2021-03-22 DIAGNOSIS — C109 Malignant neoplasm of oropharynx, unspecified: Secondary | ICD-10-CM

## 2021-03-22 DIAGNOSIS — R1310 Dysphagia, unspecified: Secondary | ICD-10-CM | POA: Diagnosis not present

## 2021-03-22 LAB — CBC WITH DIFFERENTIAL (CANCER CENTER ONLY)
Abs Immature Granulocytes: 0.02 10*3/uL (ref 0.00–0.07)
Basophils Absolute: 0 10*3/uL (ref 0.0–0.1)
Basophils Relative: 1 %
Eosinophils Absolute: 0.2 10*3/uL (ref 0.0–0.5)
Eosinophils Relative: 4 %
HCT: 31 % — ABNORMAL LOW (ref 39.0–52.0)
Hemoglobin: 10.6 g/dL — ABNORMAL LOW (ref 13.0–17.0)
Immature Granulocytes: 0 %
Lymphocytes Relative: 15 %
Lymphs Abs: 0.7 10*3/uL (ref 0.7–4.0)
MCH: 34.3 pg — ABNORMAL HIGH (ref 26.0–34.0)
MCHC: 34.2 g/dL (ref 30.0–36.0)
MCV: 100.3 fL — ABNORMAL HIGH (ref 80.0–100.0)
Monocytes Absolute: 0.4 10*3/uL (ref 0.1–1.0)
Monocytes Relative: 9 %
Neutro Abs: 3.3 10*3/uL (ref 1.7–7.7)
Neutrophils Relative %: 71 %
Platelet Count: 146 10*3/uL — ABNORMAL LOW (ref 150–400)
RBC: 3.09 MIL/uL — ABNORMAL LOW (ref 4.22–5.81)
RDW: 14.9 % (ref 11.5–15.5)
WBC Count: 4.6 10*3/uL (ref 4.0–10.5)
nRBC: 0 % (ref 0.0–0.2)

## 2021-03-22 LAB — BASIC METABOLIC PANEL - CANCER CENTER ONLY
Anion gap: 8 (ref 5–15)
BUN: 7 mg/dL (ref 6–20)
CO2: 26 mmol/L (ref 22–32)
Calcium: 9.1 mg/dL (ref 8.9–10.3)
Chloride: 107 mmol/L (ref 98–111)
Creatinine: 0.88 mg/dL (ref 0.61–1.24)
GFR, Estimated: >60 mL/min (ref >60)
Glucose, Bld: 93 mg/dL (ref 70–99)
Potassium: 3.8 mmol/L (ref 3.5–5.1)
Sodium: 141 mmol/L (ref 135–145)

## 2021-03-22 LAB — MAGNESIUM: Magnesium: 1.9 mg/dL (ref 1.7–2.4)

## 2021-03-22 MED ORDER — DEXTROSE-NACL 5-0.45 % IV SOLN: Freq: Once | INTRAVENOUS | Status: AC

## 2021-03-22 MED ORDER — PALONOSETRON HCL INJECTION 0.25 MG/5ML
0.2500 mg | Freq: Once | INTRAVENOUS | Status: AC
  Administered 2021-03-22: 0.25 mg via INTRAVENOUS
  Filled 2021-03-22: qty 5

## 2021-03-22 MED ORDER — SODIUM CHLORIDE 0.9% FLUSH
10.0000 mL | Freq: Once | INTRAVENOUS | Status: AC | PRN
  Administered 2021-03-22: 10 mL

## 2021-03-22 MED ORDER — HEPARIN SOD (PORK) LOCK FLUSH 100 UNIT/ML IV SOLN
500.0000 [IU] | Freq: Once | INTRAVENOUS | Status: AC | PRN
  Administered 2021-03-22: 500 [IU]

## 2021-03-22 NOTE — Progress Notes (Addendum)
Mr. Vanderwilt presents today for radiation follow-up after ending his treatment a few fractions early on 02/11/2021  Pain issues, if any: 2/10in throat area still has stopped fentanyl patch but still uses the oxcodone 2 times per day Using a feeding tube?: no 03/17/2021 Last telephone note from Barb-Neff-RD: "Patient reports he has made a turn for the better.  He is able to eat applesauce and part of a breakfast burrito.  He drinks water and Gatorade. Reports his throat continues to be sore but he is now swallowing his saliva.  He is up out of bed and walking his dog daily.  He uses 3 [employer] 1.5 peptide via feeding tube daily.  Reports weight is stable at 213 pounds August 25.  He continues to get IV fluids and Aloxi." Weight changes, if any:  Filed Weights   03/23/21 1200  Weight: 216 lb 6.4 oz (98.2 kg)     Swallowing issues, if any: none Smoking or chewing tobacco? no Using fluoride trays daily? no Last ENT visit was on: Not since diagnosis Other notable issues, if any: Has stopped using feeding tube and has stopped fentanyl patch states he is doing much better but still has no taste.. Wants feeding tube removed.

## 2021-03-23 ENCOUNTER — Encounter: Payer: Self-pay | Admitting: Radiation Oncology

## 2021-03-23 ENCOUNTER — Ambulatory Visit
Admission: RE | Admit: 2021-03-23 | Discharge: 2021-03-23 | Disposition: A | Discharge status: Home / Self Care | Payer: Managed Care, Other (non HMO) | Source: Ambulatory Visit | Attending: Radiation Oncology | Admitting: Radiation Oncology

## 2021-03-23 ENCOUNTER — Other Ambulatory Visit: Payer: Self-pay

## 2021-03-23 VITALS — BP 111/78 | HR 69 | Temp 98.1°F | Resp 18 | Wt 216.4 lb

## 2021-03-23 DIAGNOSIS — C090 Malignant neoplasm of tonsillar fossa: Secondary | ICD-10-CM

## 2021-03-23 NOTE — Progress Notes (Signed)
Oncology Nurse Navigator Documentation   I visited with Gerald [name] before his follow up with Dr. Isidore Moos today. He reports feeling much better and is able to eat well without using his PEG tube for one week. He will return for a PET scan and follow up with Dr. Isidore Moos for results. He knows to call me if he has any questions or concerns.  Harlow Asa RN, BSN, OCN Head & Neck Oncology Nurse [place] at [facility] Phone # [phone]  Fax # [phone]

## 2021-03-23 NOTE — Progress Notes (Signed)
Radiation Oncology         [phone] ________________________________  Name: Gerald [name] MRN: [id number]  Date: 03/23/2021  DOB: 1970-07-29  Follow-Up Visit Note  CC: Pcp, No  Pa, [facility]*  Diagnosis and Prior Radiotherapy:       ICD-10-CM   1. Cancer of tonsillar fossa (HCC)  C09.0 NM PET Image Restag (PS) Skull Base To Thigh     Cancer Staging Cancer of tonsillar fossa (HCC) Staging form: Pharynx - HPV-Mediated Oropharynx, AJCC 8th Edition - Clinical stage from 12/07/2020: Stage I (cT2, cN1, cM0, p16+) - Signed by Eppie Gibson, MD on 12/07/2020 Stage prefix: Initial diagnosis  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HCC) Staging form: Pharynx - HPV-Mediated Oropharynx, AJCC 8th Edition - Clinical stage from 12/06/2020: Stage I (cT2, cN1, cM0, p16+) - Signed by Benay Pike, MD on 12/06/2020 Stage prefix: Initial diagnosis  CHIEF COMPLAINT:  Here for follow-up and surveillance of tonsil cancer  Narrative:  The patient returns today for routine follow-up.  Mr. Batz presents today for radiation follow-up after ending his treatment early on 02/11/2021 due to side effects  He is doing much better today and has been eating everything by mouth for at least a week.  Pain issues, if any: 2/10in throat area still has stopped fentanyl patch but still uses the oxcodone 2 times per day Using a feeding tube?: no 03/17/2021 Last telephone note from Barb-Neff-RD: "Patient reports he has made a turn for the better.  He is able to eat applesauce and part of a breakfast burrito.  He drinks water and Gatorade. Reports his throat continues to be sore but he is now swallowing his saliva.  He is up out of bed and walking his dog daily.  He uses 3 [employer] 1.5 peptide via feeding tube daily.  Reports weight is stable at 213 pounds August 25.  He continues to get IV fluids and Aloxi." Weight changes, if any:  Filed Weights   03/23/21 1200  Weight: 216 lb 6.4 oz (98.2 kg)     Swallowing issues, if any: none Smoking or chewing tobacco? no Using fluoride trays daily? no Last ENT visit was on: Not since diagnosis Other notable issues, if any: Has stopped using feeding tube and has stopped fentanyl patch states he is doing much better but still has no taste.. Wants feeding tube removed.                     ALLERGIES:  has No Known Allergies.  Meds: Current Outpatient Medications  Medication Sig Dispense Refill   glycopyrrolate (ROBINUL) 1 MG tablet Take 1 tablet (1 mg total) by mouth every 12 (twelve) hours as needed. (Patient taking differently: Take 1 mg by mouth every 12 (twelve) hours as needed (stomach pain).) 10 tablet 0   HYDROcodone-acetaminophen (HYCET) 7.5-325 mg/15 ml solution Take 15 mLs by mouth every 6 (six) hours as needed for moderate pain or severe pain. 473 mL 0   Nutritional Supplements (K[employer] PEPTIDE 1.5) LIQD 1,800 mLs by Enteral route daily. 1800 mL 6   ondansetron (ZOFRAN ODT) 8 MG disintegrating tablet Take 1 tablet (8 mg total) by mouth every 8 (eight) hours as needed for nausea or vomiting. 30 tablet 0   scopolamine (TRANSDERM-SCOP) 1 MG/3DAYS Place 1 patch (1.5 mg total) onto the skin every 3 (three) days. 10 patch 12   fentaNYL (DURAGESIC) 25 MCG/HR Place 1 patch onto the skin every 3 (three) days. (Patient not taking: Reported  on 03/23/2021) 10 patch 0   lidocaine-prilocaine (EMLA) cream Apply 1 application topically as needed. (Patient not taking: Reported on 03/23/2021) 30 g 0   LORazepam (ATIVAN) 0.5 MG tablet Take 1 tablet (0.5 mg total) by mouth every 8 (eight) hours as needed. (Patient not taking: Reported on 03/23/2021) 30 tablet 0   prochlorperazine (COMPAZINE) 10 MG tablet Take 1 tablet (10 mg total) by mouth every 6 (six) hours as needed (Nausea or vomiting). (Patient not taking: Reported on 03/23/2021) 30 tablet 1   No current facility-administered medications for this encounter.   Facility-Administered Medications  Ordered in Other Encounters  Medication Dose Route Frequency Provider Last Rate Last Admin   heparin lock flush 100 unit/mL  500 Units Intracatheter Once PRN Iruku, Praveena, MD       sodium chloride flush (NS) 0.9 % injection 10 mL  10 mL Intracatheter Once PRN Benay Pike, MD        Physical Findings: The patient is in no acute distress. Patient is alert and oriented. Wt Readings from Last 3 Encounters:  03/23/21 216 lb 6.4 oz (98.2 kg)  03/17/21 213 lb (96.6 kg)  03/03/21 213 lb 4.8 oz (96.8 kg)    weight is 216 lb 6.4 oz (98.2 kg). His temporal temperature is 98.1 F (36.7 C). His blood pressure is 111/78 and his pulse is 69. His respiration is 18 and oxygen saturation is 100%. .  General: Alert and oriented, in no acute distress HEENT: Head is normocephalic. Extraocular movements are intact. Oropharynx is notable for no visible tumor.  Resolving mucositis in the left oropharynx.  No thrush.  Tongue is symmetric and midline Neck: Neck is notable for no palpable masses.  Mild anterior lymphedema Skin: Skin in treatment fields shows satisfactory healing in the neck Heart: Regular in rate and rhythm with no murmurs, rubs, or gallops. Chest: Clear to auscultation bilaterally, with no rhonchi, wheezes, or rales. Abdomen: PEG tube intact with mild dermatitis around the ostomy Psychiatric: Judgment and insight are intact. Affect is appropriate.   Lab Findings: Lab Results  Component Value Date   WBC 4.6 03/22/2021   HGB 10.6 (L) 03/22/2021   HCT 31.0 (L) 03/22/2021   MCV 100.3 (H) 03/22/2021   PLT 146 (L) 03/22/2021    Lab Results  Component Value Date   TSH 1.924 12/22/2020    Radiographic Findings: No results found.  Impression/Plan:    1) Head and Neck Cancer Status: Healing well from chemoradiation  2) Nutritional Status: Stable and on full oral intake PEG tube: He wants to have this removed but he is only been weaned off of it for 1 week.  He will continue to try  to maintain his weight on an oral diet until he sees medical oncology.  If he is still doing well at that time I think it is reasonable for him to have this removed as he would like it removed before he returns to work  3)  Swallowing: Continue swallowing exercises, good function  4) Dental: Encouraged to continue regular followup with dentistry, and dental hygiene including fluoride rinses.   5) continue PT for lymphedema of neck  6) Thyroid function: Check annually Lab Results  Component Value Date   TSH 1.924 12/22/2020    7) Other:   Follow-up in early November with restaging PET scan. the patient was encouraged to call with any issues or questions before then.  8) Taper from scopolamine as tolerated for thick secretions.  Continue to taper  from narcotics under the supervision of medical oncology.  On date of service, in total, I spent 30 minutes on this encounter. Patient was seen in person. _____________________________________   Eppie Gibson, MD

## 2021-03-24 ENCOUNTER — Inpatient Hospital Stay: Payer: Managed Care, Other (non HMO)

## 2021-03-24 ENCOUNTER — Ambulatory Visit: Payer: Managed Care, Other (non HMO) | Admitting: Physical Therapy

## 2021-03-24 VITALS — BP 125/72 | HR 57 | Temp 99.0°F | Resp 18

## 2021-03-24 DIAGNOSIS — Z9221 Personal history of antineoplastic chemotherapy: Secondary | ICD-10-CM | POA: Insufficient documentation

## 2021-03-24 DIAGNOSIS — R1310 Dysphagia, unspecified: Secondary | ICD-10-CM | POA: Insufficient documentation

## 2021-03-24 DIAGNOSIS — T451X5A Adverse effect of antineoplastic and immunosuppressive drugs, initial encounter: Secondary | ICD-10-CM | POA: Insufficient documentation

## 2021-03-24 DIAGNOSIS — L599 Disorder of the skin and subcutaneous tissue related to radiation, unspecified: Secondary | ICD-10-CM | POA: Insufficient documentation

## 2021-03-24 DIAGNOSIS — R293 Abnormal posture: Secondary | ICD-10-CM | POA: Insufficient documentation

## 2021-03-24 DIAGNOSIS — Z79899 Other long term (current) drug therapy: Secondary | ICD-10-CM | POA: Insufficient documentation

## 2021-03-24 DIAGNOSIS — C090 Malignant neoplasm of tonsillar fossa: Secondary | ICD-10-CM

## 2021-03-24 DIAGNOSIS — Z923 Personal history of irradiation: Secondary | ICD-10-CM | POA: Insufficient documentation

## 2021-03-24 DIAGNOSIS — D649 Anemia, unspecified: Secondary | ICD-10-CM | POA: Insufficient documentation

## 2021-03-24 DIAGNOSIS — I890 Lymphedema, not elsewhere classified: Secondary | ICD-10-CM | POA: Insufficient documentation

## 2021-03-24 DIAGNOSIS — D696 Thrombocytopenia, unspecified: Secondary | ICD-10-CM | POA: Insufficient documentation

## 2021-03-24 DIAGNOSIS — K1231 Oral mucositis (ulcerative) due to antineoplastic therapy: Secondary | ICD-10-CM | POA: Insufficient documentation

## 2021-03-24 DIAGNOSIS — R600 Localized edema: Secondary | ICD-10-CM | POA: Insufficient documentation

## 2021-03-24 DIAGNOSIS — C109 Malignant neoplasm of oropharynx, unspecified: Secondary | ICD-10-CM

## 2021-03-24 MED ORDER — SODIUM CHLORIDE 0.9% FLUSH
10.0000 mL | Freq: Once | INTRAVENOUS | Status: AC | PRN
  Administered 2021-03-24: 10 mL

## 2021-03-24 MED ORDER — DEXTROSE-NACL 5-0.45 % IV SOLN: Freq: Once | INTRAVENOUS | Status: AC

## 2021-03-24 MED ORDER — HEPARIN SOD (PORK) LOCK FLUSH 100 UNIT/ML IV SOLN
500.0000 [IU] | Freq: Once | INTRAVENOUS | Status: AC | PRN
  Administered 2021-03-24: 500 [IU]

## 2021-03-24 MED ORDER — PALONOSETRON HCL INJECTION 0.25 MG/5ML
0.2500 mg | Freq: Once | INTRAVENOUS | Status: AC
  Administered 2021-03-24: 0.25 mg via INTRAVENOUS
  Filled 2021-03-24: qty 5

## 2021-03-24 NOTE — Therapy (Signed)
[place], [place], [address] Phone: [phone]   Fax:  [phone]  Physical Therapy Treatment  Patient Details  Name: Esam Goeckner MRN: [id number] Date of Birth: [birth year] Referring Provider (PT): Reita May Date: 03/24/2021   PT End of Session - 03/24/21 [id number]     Visit Number 7    Number of Visits 11    Date for PT Re-Evaluation 04/05/21    PT Start Time [id number]    PT Stop Time 1400    PT Time Calculation (min) 53 min    Activity Tolerance Patient tolerated treatment well    Behavior During Therapy [facility] for tasks assessed/performed             Past Medical History:  Diagnosis Date   Cancer [facility])    Sleep apnea    Tonsillar mass 11/16/2020    Past Surgical History:  Procedure Laterality Date   DIRECT LARYNGOSCOPY N/A 11/22/2020   Procedure: DIRECT LARYNGOSCOPY WITH BIOPSY;  Surgeon: Leta Baptist, MD;  Location: [place];  Service: ENT;  Laterality: N/A;   FOOT SURGERY  2000   extra nivicular removal   IR GASTROSTOMY TUBE MOD SED  12/24/2020   IR IMAGING GUIDED PORT INSERTION  12/24/2020   SPINE SURGERY     TONSILLECTOMY Left 11/22/2020   Procedure: LEFT TONSILLECTOMY;  Surgeon: Leta Baptist, MD;  Location: [place];  Service: ENT;  Laterality: Left;    There were no vitals filed for this visit.   Subjective Assessment - 03/24/21 1632     Subjective Pt states that he work up with more thickness in his neck today.  He has been wearing his chin strap and doing self MLD at home He has to have 4 weeks of therapy before he can get his Flexitouch    Patient Stated Goals to gain info from provider    Currently in Pain? Yes    Pain Score --   did not rate   Pain Location Throat    Pain Type Chronic pain    Pain Onset 1 to 4 weeks ago    Aggravating Factors  swallowing    Pain Relieving Factors pain meds                                [facility] Adult PT Treatment/Exercise - 03/24/21 0001       Manual Therapy   Manual Therapy Manual Lymphatic Drainage (MLD)    Manual Lymphatic Drainage (MLD) In supine with head of bed elevated: short neck, 5 diaphragmatic breaths, bilateral axillary nodes, bilateral pectoral nodes, short neck, posterior, lateral, and anterior neck moving fluid towards pathways Extra time spent on lateral neck, ears and cheeks with extra time around right ear                         PT Long Term Goals - 02/18/21 1059       PT LONG TERM GOAL #1   Title Pt will return to baseline cervical ROM and not demonstrate any signs or symptoms of lymphedema    Status On-going      PT LONG TERM GOAL #2   Title Pt will be ind with self MLD for the neck and educated on flexitouch    Time 4    Period Weeks    Status New  PT LONG TERM GOAL #3   Title Pt will obtain appropriate compression for long term use    Time 4    Period Weeks    Status New                   Plan - 03/24/21 1635     Clinical Impression Statement Pt with visible improvement at end of session of PT with palpable softening of fullness at neck Pt will continue with home treatment    Personal Factors and Comorbidities Comorbidity 2    Comorbidities recent chemotherapy and radiation, recenet hospitalzation    Examination-Participation Restrictions Occupation    Stability/Clinical Decision Making Stable/Uncomplicated    Rehab Potential Good    PT Frequency 2x / week    PT Duration 4 weeks    PT Treatment/Interventions ADLs/Self Care Home Management;Therapeutic exercise;Patient/family education;Manual techniques;DME Instruction;Compression bandaging;Manual lymph drainage;Vasopneumatic Device;Taping;Passive range of motion    PT Next Visit Plan continue MLD with extra time at lateral neck especially on right  did he get Flexitouch? how long is he able to wear marena             Patient will benefit from skilled  therapeutic intervention in order to improve the following deficits and impairments:  Postural dysfunction, Pain, Decreased knowledge of precautions, Increased edema  Visit Diagnosis: Lymphedema, not elsewhere classified  Abnormal posture  Malignant neoplasm of tonsillar fossa (HCC)  Dysphagia, unspecified type  Localized edema  Disorder of the skin and subcutaneous tissue related to radiation, unspecified     Problem List Patient Active Problem List   Diagnosis Date Noted   Intractable nausea and vomiting 01/29/2021   Dehydration 01/29/2021   Pancytopenia ([place]) 01/29/2021   GERD (gastroesophageal reflux disease) 01/29/2021   Weight loss, unintentional 01/04/2021   Tinnitus 01/04/2021   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 Owens Shark PT  Norwood Levo 03/24/2021, 4:36 PM  [place], [place], [address] Phone: [phone]   Fax:  [phone]  Name: Bond Goldfine MRN: [id number] Date of Birth: April 24, 1971

## 2021-03-26 ENCOUNTER — Inpatient Hospital Stay: Payer: Managed Care, Other (non HMO)

## 2021-03-26 ENCOUNTER — Other Ambulatory Visit: Payer: Self-pay

## 2021-03-26 VITALS — BP 122/83 | HR 78 | Temp 98.4°F | Resp 18

## 2021-03-26 DIAGNOSIS — R1310 Dysphagia, unspecified: Secondary | ICD-10-CM | POA: Diagnosis not present

## 2021-03-26 DIAGNOSIS — C109 Malignant neoplasm of oropharynx, unspecified: Secondary | ICD-10-CM

## 2021-03-26 MED ORDER — HEPARIN SOD (PORK) LOCK FLUSH 100 UNIT/ML IV SOLN
500.0000 [IU] | Freq: Once | INTRAVENOUS | Status: AC | PRN
  Administered 2021-03-26: 500 [IU]

## 2021-03-26 MED ORDER — PALONOSETRON HCL INJECTION 0.25 MG/5ML
0.2500 mg | Freq: Once | INTRAVENOUS | Status: AC
  Administered 2021-03-26: 0.25 mg via INTRAVENOUS

## 2021-03-26 MED ORDER — SODIUM CHLORIDE 0.9% FLUSH
10.0000 mL | Freq: Once | INTRAVENOUS | Status: AC | PRN
  Administered 2021-03-26: 10 mL

## 2021-03-26 MED ORDER — PALONOSETRON HCL INJECTION 0.25 MG/5ML
INTRAVENOUS | Status: AC
  Filled 2021-03-26: qty 5

## 2021-03-26 MED ORDER — DEXTROSE-NACL 5-0.45 % IV SOLN
Freq: Once | INTRAVENOUS | Status: AC
Start: 2021-03-26 — End: 2021-03-26

## 2021-03-26 NOTE — Patient Instructions (Signed)

## 2021-03-29 ENCOUNTER — Ambulatory Visit: Payer: Managed Care, Other (non HMO) | Admitting: Rehabilitation

## 2021-03-31 ENCOUNTER — Ambulatory Visit: Payer: Managed Care, Other (non HMO)

## 2021-03-31 ENCOUNTER — Inpatient Hospital Stay (HOSPITAL_BASED_OUTPATIENT_CLINIC_OR_DEPARTMENT_OTHER): Payer: Managed Care, Other (non HMO) | Admitting: Hematology and Oncology

## 2021-03-31 ENCOUNTER — Inpatient Hospital Stay: Payer: Managed Care, Other (non HMO)

## 2021-03-31 ENCOUNTER — Encounter: Payer: Self-pay | Admitting: Hematology and Oncology

## 2021-03-31 ENCOUNTER — Other Ambulatory Visit: Payer: Self-pay

## 2021-03-31 ENCOUNTER — Other Ambulatory Visit: Payer: Self-pay | Admitting: Hematology and Oncology

## 2021-03-31 VITALS — BP 128/95 | HR 68 | Temp 97.9°F | Resp 17 | Wt 218.3 lb

## 2021-03-31 DIAGNOSIS — R600 Localized edema: Secondary | ICD-10-CM

## 2021-03-31 DIAGNOSIS — C109 Malignant neoplasm of oropharynx, unspecified: Secondary | ICD-10-CM | POA: Diagnosis not present

## 2021-03-31 DIAGNOSIS — K1231 Oral mucositis (ulcerative) due to antineoplastic therapy: Secondary | ICD-10-CM

## 2021-03-31 DIAGNOSIS — D759 Disease of blood and blood-forming organs, unspecified: Secondary | ICD-10-CM

## 2021-03-31 DIAGNOSIS — C090 Malignant neoplasm of tonsillar fossa: Secondary | ICD-10-CM

## 2021-03-31 DIAGNOSIS — I890 Lymphedema, not elsewhere classified: Secondary | ICD-10-CM

## 2021-03-31 DIAGNOSIS — R293 Abnormal posture: Secondary | ICD-10-CM

## 2021-03-31 DIAGNOSIS — Z95828 Presence of other vascular implants and grafts: Secondary | ICD-10-CM

## 2021-03-31 DIAGNOSIS — D539 Nutritional anemia, unspecified: Secondary | ICD-10-CM

## 2021-03-31 DIAGNOSIS — T451X5A Adverse effect of antineoplastic and immunosuppressive drugs, initial encounter: Secondary | ICD-10-CM

## 2021-03-31 DIAGNOSIS — L599 Disorder of the skin and subcutaneous tissue related to radiation, unspecified: Secondary | ICD-10-CM

## 2021-03-31 DIAGNOSIS — R1310 Dysphagia, unspecified: Secondary | ICD-10-CM | POA: Diagnosis not present

## 2021-03-31 HISTORY — DX: Disease of blood and blood-forming organs, unspecified: D75.9

## 2021-03-31 HISTORY — DX: Oral mucositis (ulcerative) due to antineoplastic therapy: K12.31

## 2021-03-31 LAB — CBC WITH DIFFERENTIAL/PLATELET
Abs Immature Granulocytes: 0 10*3/uL (ref 0.00–0.07)
Basophils Absolute: 0 10*3/uL (ref 0.0–0.1)
Basophils Relative: 1 %
Eosinophils Absolute: 0.3 10*3/uL (ref 0.0–0.5)
Eosinophils Relative: 6 %
HCT: 31.4 % — ABNORMAL LOW (ref 39.0–52.0)
Hemoglobin: 10.8 g/dL — ABNORMAL LOW (ref 13.0–17.0)
Immature Granulocytes: 0 %
Lymphocytes Relative: 19 %
Lymphs Abs: 0.9 10*3/uL (ref 0.7–4.0)
MCH: 34.7 pg — ABNORMAL HIGH (ref 26.0–34.0)
MCHC: 34.4 g/dL (ref 30.0–36.0)
MCV: 101 fL — ABNORMAL HIGH (ref 80.0–100.0)
Monocytes Absolute: 0.4 10*3/uL (ref 0.1–1.0)
Monocytes Relative: 7 %
Neutro Abs: 3.2 10*3/uL (ref 1.7–7.7)
Neutrophils Relative %: 67 %
Platelets: 128 10*3/uL — ABNORMAL LOW (ref 150–400)
RBC: 3.11 MIL/uL — ABNORMAL LOW (ref 4.22–5.81)
RDW: 13.5 % (ref 11.5–15.5)
WBC: 4.7 10*3/uL (ref 4.0–10.5)
nRBC: 0 % (ref 0.0–0.2)

## 2021-03-31 LAB — COMPREHENSIVE METABOLIC PANEL
ALT: 22 U/L (ref 0–44)
AST: 19 U/L (ref 15–41)
Albumin: 3.7 g/dL (ref 3.5–5.0)
Alkaline Phosphatase: 59 U/L (ref 38–126)
Anion gap: 9 (ref 5–15)
BUN: 7 mg/dL (ref 6–20)
CO2: 27 mmol/L (ref 22–32)
Calcium: 9.1 mg/dL (ref 8.9–10.3)
Chloride: 106 mmol/L (ref 98–111)
Creatinine, Ser: 1.04 mg/dL (ref 0.61–1.24)
GFR, Estimated: >60 mL/min (ref >60)
Glucose, Bld: 107 mg/dL — ABNORMAL HIGH (ref 70–99)
Potassium: 3.7 mmol/L (ref 3.5–5.1)
Sodium: 142 mmol/L (ref 135–145)
Total Bilirubin: 0.9 mg/dL (ref 0.3–1.2)
Total Protein: 6.2 g/dL — ABNORMAL LOW (ref 6.5–8.1)

## 2021-03-31 LAB — MAGNESIUM: Magnesium: 1.8 mg/dL (ref 1.7–2.4)

## 2021-03-31 MED ORDER — HEPARIN SOD (PORK) LOCK FLUSH 100 UNIT/ML IV SOLN
500.0000 [IU] | INTRAVENOUS | Status: AC | PRN
  Administered 2021-03-31: 500 [IU]

## 2021-03-31 MED ORDER — SODIUM CHLORIDE 0.9% FLUSH
10.0000 mL | INTRAVENOUS | Status: AC | PRN
  Administered 2021-03-31: 10 mL

## 2021-03-31 MED ORDER — HYDROCODONE-ACETAMINOPHEN 5-325 MG PO TABS: 1.0000 | ORAL_TABLET | Freq: Two times a day (BID) | ORAL | 0 refills | Status: DC | PRN

## 2021-03-31 MED ORDER — SCOPOLAMINE 1 MG/3DAYS TD PT72: 1.0000 | MEDICATED_PATCH | TRANSDERMAL | 12 refills | Status: DC

## 2021-03-31 NOTE — Assessment & Plan Note (Signed)
Last labs showed moderate anemia and thrombocytopenia.  Repeat labs today.  Anticipate improvement since he has been nourishing well.

## 2021-03-31 NOTE — Progress Notes (Signed)
[name]   [phone] [phone]   Clinic Follow up Note   Patient Care Team: Pcp, No as PCP - General [place], Stephani Police, RN as Oncology Nurse Navigator Eppie Gibson, MD as Consulting Physician (Radiation Oncology) Benay Pike, MD as Consulting Physician (Hematology and Oncology) Leta Baptist, MD as Consulting Physician (Otolaryngology) 03/31/2021  CHIEF COMPLAINT: Follow up HPV+ scc of oropharynx , post treatment follow up.  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  He missed the last 4 doses of radiation.   He has recovered very well since his last dose of chemotherapy. He continues to have some pain related to mucositis and has been using hydrocodone sparingly. Physical examination today unremarkable, no concerns for recurrence. We will repeat labs today.  Last labs showed mild thrombocytopenia and anemia. He will proceed with PET/CT in November and follow-up with Dr. Isidore Moos for review.  He was encouraged to establish with Dr. Lucia Gaskins for ENT surveillance after Dr. Pearlie Oyster follow-up.  We will see him back in 6 months unless new medical oncology concerns arise  Mucositis due to antineoplastic therapy Pain related to mucositis, ongoing however significantly improved.  Previously used to need fentanyl and hydrocodone for pain management.  He currently has been using only hydrocodone about twice a day. He would rather take a pill at this point since he cannot swallow.  Liquid hydrocodone prescription discontinued.  Hydrocodone 5/325 mg dispensed every 12 hours for pain control.  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  Repeat labs  today.  Anticipate improvement since he has been nourishing well.   SUMMARY OF ONCOLOGIC HISTORY: Oncology History Overview Note  51 yr old male who has experiencing chronic sore throat for the past few months. He underwent a CT neck which showed primarily a submucosal 3 cm mass within the left tonsillar fossa also noted to have 3.2 cm left jugulodigastric lymph node, findings were concerning for left tonsillar malignancy and malignant lymph node. He underwent left tonsillectomy by Dr. [name] and pathology showed invasive squamous cell carcinoma, HPV mediated.  He He had PET imaging which showed evidence of left tonsillar cancer and left ipsilateral lymph nodes, from my review, no evidence of distant metastasis, I could not see an official report on the PET imaging. He is here for initial recommendations.   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[place])  12/06/2020 Initial Diagnosis   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[place])   12/06/2020 Cancer Staging   Staging form: Pharynx - HPV-Mediated Oropharynx, AJCC 8th Edition - Clinical stage from 12/06/2020: Stage I (cT2, cN1, cM0, p16+) - Signed by Benay Pike, MD on 12/06/2020 Stage prefix: Initial diagnosis   12/27/2020 -  Chemotherapy    Patient is on Treatment Plan: HEAD/NECK CISPLATIN Q7D       Cancer of tonsillar fossa ([place])  12/07/2020 Initial Diagnosis   Cancer of tonsillar fossa ([place])   12/07/2020 Cancer Staging   Staging form: Pharynx - HPV-Mediated Oropharynx, AJCC 8th Edition - Clinical stage from 12/07/2020: Stage I (cT2, cN1, cM0, p16+) - Signed by Eppie Gibson, MD on 12/07/2020 Stage prefix: Initial diagnosis    Completed Chemo RT with weekly cisplatin, received 5 weekly cycles, last treatment on 01/26/2021.  He was then  admitted with complications and rest of the chemo was deferred.  CURRENT THERAPY: none  INTERVAL HISTORY:   Gerald [name] is here for follow-up. He feels fantastic since last visit.  He is now able to eat  everything by mouth although he cannot taste everything.  He has started gaining some weight. He has been using pain medication about twice a day.  He has stopped using fentanyl patch about 2 weeks ago.  He continues to need scopolamine patch, he did give a trial without scopolamine when he noticed more secretions. He would like to get the G-tube removed and go back to work. No worsening tinnitus.  He may have had some mild hearing loss since he completed chemo but it seems to be more selective hearing with certain frequencies.  No neuropathy or change in urination. Rest of the pertinent 10 point ROS reviewed and negative.   Past Medical History:  Diagnosis Date   Cancer [facility])    Sleep apnea    Tonsillar mass 11/16/2020    SURGICAL HISTORY: Past Surgical History:  Procedure Laterality Date   DIRECT LARYNGOSCOPY N/A 11/22/2020   Procedure: DIRECT LARYNGOSCOPY WITH BIOPSY;  Surgeon: Leta Baptist, MD;  Location: [place];  Service: ENT;  Laterality: N/A;   FOOT SURGERY  2000   extra nivicular removal   IR GASTROSTOMY TUBE MOD SED  12/24/2020   IR IMAGING GUIDED PORT INSERTION  12/24/2020   SPINE SURGERY     TONSILLECTOMY Left 11/22/2020   Procedure: LEFT TONSILLECTOMY;  Surgeon: Leta Baptist, MD;  Location: [place];  Service: ENT;  Laterality: Left;    I have reviewed the social history and family history with the patient and they are unchanged from previous note.  ALLERGIES:  has No Known Allergies.  MEDICATIONS:  Current Outpatient Medications  Medication Sig Dispense Refill   HYDROcodone-acetaminophen (NORCO/VICODIN) 5-325 MG tablet Take 1 tablet by mouth every 12 (twelve) hours as needed for severe pain (mucositis related pain). 60 tablet 0   scopolamine (TRANSDERM-SCOP) 1 MG/3DAYS Place 1 patch (1.5 mg total) onto the skin every 3 (three) days. 10 patch 12   No current facility-administered medications for this visit.   Facility-Administered Medications  Ordered in Other Visits  Medication Dose Route Frequency Provider Last Rate Last Admin   heparin lock flush 100 unit/mL  500 Units Intracatheter Once PRN Stuart Mirabile, MD       sodium chloride flush (NS) 0.9 % injection 10 mL  10 mL Intracatheter Once PRN Mikeala Girdler, Arletha Pili, MD        PHYSICAL EXAMINATION: ECOG PERFORMANCE STATUS: 1 - Symptomatic but completely ambulatory  Vitals:   03/31/21 1349  BP: (!) 128/95  Pulse: 68  Resp: 17  Temp: 97.9 F (36.6 C)  SpO2: 100%   Filed Weights   03/31/21 1349  Weight: 218 lb 4.8 oz (99 kg)   Physical Exam Constitutional:      Appearance: Normal appearance.  HENT:     Head: Normocephalic and atraumatic.  Cardiovascular:     Rate and Rhythm: Normal rate and regular rhythm.     Pulses: Normal pulses.     Heart sounds: Normal heart sounds.  Pulmonary:     Effort: Pulmonary effort is normal.     Breath sounds: Normal breath sounds.  Abdominal:     General: Abdomen is flat. Bowel sounds are normal.     Comments: G-tube site looks healthy.  Musculoskeletal:        General:  No swelling or tenderness.     Cervical back: Normal range of motion and neck supple. No rigidity.  Lymphadenopathy:     Cervical: No cervical adenopathy.  Skin:    General: Skin is warm and dry.  Neurological:     General: No focal deficit present.     Mental Status: He is alert.  Psychiatric:        Mood and Affect: Mood normal.    LABORATORY DATA:  I have reviewed the data as listed CBC Latest Ref Rng & Units 03/31/2021 03/22/2021 03/17/2021  WBC 4.0 - 10.5 K/uL 4.7 4.6 5.4  Hemoglobin 13.0 - 17.0 g/dL 10.8(L) 10.6(L) 10.8(L)  Hematocrit 39.0 - 52.0 % 31.4(L) 31.0(L) 32.5(L)  Platelets 150 - 400 K/uL 128(L) 146(L) 177     CMP Latest Ref Rng & Units 03/22/2021 03/17/2021 03/08/2021  Glucose 70 - 99 mg/dL 93 101(H) 94  BUN 6 - 20 mg/dL '7 13 17  '$ Creatinine 0.61 - 1.24 mg/dL 0.88 0.87 0.98  Sodium 135 - 145 mmol/L 141 140 141  Potassium 3.5 - 5.1 mmol/L 3.8  4.2 4.3  Chloride 98 - 111 mmol/L 107 105 106  CO2 22 - 32 mmol/L '26 29 27  '$ Calcium 8.9 - 10.3 mg/dL 9.1 9.1 9.2  Total Protein 6.5 - 8.1 g/dL - - -  Total Bilirubin 0.3 - 1.2 mg/dL - - -  Alkaline Phos 38 - 126 U/L - - -  AST 15 - 41 U/L - - -  ALT 0 - 44 U/L - - -    RADIOGRAPHIC STUDIES: I have personally reviewed the radiological images as listed and agreed with the findings in the report. No results found.   Benay Pike MD

## 2021-03-31 NOTE — Therapy (Signed)
[place], [place], [address] Phone: [phone]   Fax:  [phone]  Physical Therapy Treatment  Patient Details  Name: Gerald [name] MRN: [id number] Date of Birth: 1970/11/16 Referring Provider (PT): Reita May Date: 03/31/2021   PT End of Session - 03/31/21 1107     Visit Number 8    Number of Visits 11    Date for PT Re-Evaluation 04/05/21    PT Start Time 1008    PT Stop Time 1108    PT Time Calculation (min) 60 min    Activity Tolerance Patient tolerated treatment well    Behavior During Therapy [facility] for tasks assessed/performed             Past Medical History:  Diagnosis Date   Cancer ([place])    Sleep apnea    Tonsillar mass 11/16/2020    Past Surgical History:  Procedure Laterality Date   DIRECT LARYNGOSCOPY N/A 11/22/2020   Procedure: DIRECT LARYNGOSCOPY WITH BIOPSY;  Surgeon: Leta Baptist, MD;  Location: [place];  Service: ENT;  Laterality: N/A;   FOOT SURGERY  2000   extra nivicular removal   IR GASTROSTOMY TUBE MOD SED  12/24/2020   IR IMAGING GUIDED PORT INSERTION  12/24/2020   SPINE SURGERY     TONSILLECTOMY Left 11/22/2020   Procedure: LEFT TONSILLECTOMY;  Surgeon: Leta Baptist, MD;  Location: [place];  Service: ENT;  Laterality: Left;    There were no vitals filed for this visit.   Subjective Assessment - 03/31/21 1011     Subjective I'm wearing my Marena about 2hrs at a time 2x/day. I notice that I see some good definition after wearing it. And I use the chip pack when I wear it in the evening. I'm also doing my self MLD daily as well. Overall I can tell the compression and MLD help temporarily but the swelling always comes back and I've recently noticed it being a little worse when I wake up in the morning as I get further out from radiation.    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  Hospitalized x 12 days for side effects has been out x 1-2 weeks now.  Still losing weight.    Patient Stated Goals to gain info from provider    Currently in Pain? Yes    Pain Score 6     Pain Location Throat    Pain Orientation Left    Pain Descriptors / Indicators Sore;Aching    Pain Type Chronic pain    Pain Onset More than a month ago    Pain Frequency Intermittent    Aggravating Factors  swallowing    Pain Relieving Factors pain meds                               [facility] Adult PT Treatment/Exercise - 03/31/21 0001       Manual Therapy   Manual Lymphatic Drainage (MLD) In supine with head of bed elevated: long neck, 5 diaphragmatic breaths, bilateral axillary nodes, bilateral pectoral nodes, short neck, posterior, lateral, and anterior neck moving fluid towards pathways                          PT Long Term Goals - 02/18/21 1059  PT LONG TERM GOAL #1   Title Pt will return to baseline cervical ROM and not demonstrate any signs or symptoms of lymphedema    Status On-going      PT LONG TERM GOAL #2   Title Pt will be ind with self MLD for the neck and educated on flexitouch    Time 4    Period Weeks    Status New      PT LONG TERM GOAL #3   Title Pt will obtain appropriate compression for long term use    Time 4    Period Weeks    Status New                   Plan - 03/31/21 1109     Clinical Impression Statement Pt continues with visible softening at anterior throat by end of session. He will continue with wearing his compression garment daily and discussed him also trying to wear this to bed. He reports not sure he could tolerate Marena at night but wants to try chip pack at night.    Personal Factors and Comorbidities Comorbidity 2    Comorbidities recent chemotherapy and radiation, recenet hospitalzation    Examination-Participation Restrictions Occupation    Stability/Clinical  Decision Making Stable/Uncomplicated    Rehab Potential Good    PT Frequency 2x / week    PT Duration 4 weeks    PT Treatment/Interventions ADLs/Self Care Home Management;Therapeutic exercise;Patient/family education;Manual techniques;DME Instruction;Compression bandaging;Manual lymph drainage;Vasopneumatic Device;Taping;Passive range of motion    PT Next Visit Plan continue MLD with extra time at lateral neck especially on right;  did he get Flexitouch?    PT Home Exercise Plan head and neck ROM exercises, self MLD    Consulted and Agree with Plan of Care Patient             Patient will benefit from skilled therapeutic intervention in order to improve the following deficits and impairments:  Postural dysfunction, Pain, Decreased knowledge of precautions, Increased edema  Visit Diagnosis: Lymphedema, not elsewhere classified  Abnormal posture  Malignant neoplasm of tonsillar fossa (HCC)  Localized edema  Disorder of the skin and subcutaneous tissue related to radiation, unspecified     Problem List Patient Active Problem List   Diagnosis Date Noted   Intractable nausea and vomiting 01/29/2021   Dehydration 01/29/2021   Pancytopenia ([place]) 01/29/2021   GERD (gastroesophageal reflux disease) 01/29/2021   Weight loss, unintentional 01/04/2021   Tinnitus 01/04/2021   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

## 2021-03-31 NOTE — Assessment & Plan Note (Signed)
Pain related to mucositis, ongoing however significantly improved.  Previously used to need fentanyl and hydrocodone for pain management.  He currently has been using only hydrocodone about twice a day. He would rather take a pill at this point since he cannot swallow.  Liquid hydrocodone prescription discontinued.  Hydrocodone 5/325 mg dispensed every 12 hours for pain control.

## 2021-03-31 NOTE — Assessment & Plan Note (Signed)
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  He missed the last 4 doses of radiation.   He has recovered very well since his last dose of chemotherapy. He continues to have some pain related to mucositis and has been using hydrocodone sparingly. Physical examination today unremarkable, no concerns for recurrence. We will repeat labs today.  Last labs showed mild thrombocytopenia and anemia. He will proceed with PET/CT in November and follow-up with Dr. Isidore Moos for review.  He was encouraged to establish with Dr. Lucia Gaskins for ENT surveillance after Dr. Pearlie Oyster follow-up.  We will see him back in 6 months unless new medical oncology concerns arise

## 2021-03-31 NOTE — Progress Notes (Signed)
Ordered additional labs since macrocytosis and anemia persists.  Gerald [name]

## 2021-04-01 ENCOUNTER — Telehealth: Payer: Self-pay | Admitting: Hematology and Oncology

## 2021-04-01 NOTE — Telephone Encounter (Signed)
Scheduled per sch msg. Called and left msg  

## 2021-04-04 ENCOUNTER — Other Ambulatory Visit: Payer: Self-pay

## 2021-04-04 ENCOUNTER — Encounter: Payer: Self-pay | Admitting: Physical Therapy

## 2021-04-04 ENCOUNTER — Ambulatory Visit: Payer: Managed Care, Other (non HMO) | Admitting: Physical Therapy

## 2021-04-04 ENCOUNTER — Other Ambulatory Visit: Payer: Self-pay | Admitting: Hematology and Oncology

## 2021-04-04 ENCOUNTER — Encounter: Payer: Self-pay | Admitting: Hematology and Oncology

## 2021-04-04 DIAGNOSIS — R293 Abnormal posture: Secondary | ICD-10-CM

## 2021-04-04 DIAGNOSIS — I890 Lymphedema, not elsewhere classified: Secondary | ICD-10-CM

## 2021-04-04 DIAGNOSIS — C090 Malignant neoplasm of tonsillar fossa: Secondary | ICD-10-CM

## 2021-04-04 DIAGNOSIS — R1310 Dysphagia, unspecified: Secondary | ICD-10-CM | POA: Diagnosis not present

## 2021-04-04 DIAGNOSIS — L599 Disorder of the skin and subcutaneous tissue related to radiation, unspecified: Secondary | ICD-10-CM

## 2021-04-04 NOTE — Progress Notes (Signed)
  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  Due to mucositis, thick secretions, malaise, nausea that could not be completely alleviated by medications, he declined completion of therapy as planned and stopped RT after 31 fractions.  Plan: The patient has completed radiation treatment. The patient will return to radiation oncology clinic for routine followup in one  month and will see med/onc sooner. I advised them to call or return sooner if they have any questions or concerns related to their recovery or treatment.  -----------------------------------  Eppie Gibson, MD

## 2021-04-04 NOTE — Therapy (Signed)
[place], [place], [address] Phone: [phone]   Fax:  [phone]  Physical Therapy Treatment  Patient Details  Name: Gerald [name] MRN: [id number] Date of Birth: July 31, 1970 Referring Provider (PT): Reita May Date: 04/04/2021   PT End of Session - 04/04/21 1153     Visit Number 9    Number of Visits 11    Date for PT Re-Evaluation 04/05/21    PT Start Time 1107    PT Stop Time 1150    PT Time Calculation (min) 43 min    Activity Tolerance Patient tolerated treatment well    Behavior During Therapy [facility] for tasks assessed/performed             Past Medical History:  Diagnosis Date   Cancer ([place])    Sleep apnea    Tonsillar mass 11/16/2020    Past Surgical History:  Procedure Laterality Date   DIRECT LARYNGOSCOPY N/A 11/22/2020   Procedure: DIRECT LARYNGOSCOPY WITH BIOPSY;  Surgeon: Leta Baptist, MD;  Location: [place];  Service: ENT;  Laterality: N/A;   FOOT SURGERY  2000   extra nivicular removal   IR GASTROSTOMY TUBE MOD SED  12/24/2020   IR IMAGING GUIDED PORT INSERTION  12/24/2020   SPINE SURGERY     TONSILLECTOMY Left 11/22/2020   Procedure: LEFT TONSILLECTOMY;  Surgeon: Leta Baptist, MD;  Location: [place];  Service: ENT;  Laterality: Left;    There were no vitals filed for this visit.   Subjective Assessment - 04/04/21 1110     Subjective I feel like I am getting worse. When I wake up it feels full and I feel like I need to wear the [facility]. I feel like things started getting worse last week.    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  Hospitalized x 12 days for side effects has been out x 1-2 weeks now.  Still losing weight.    Patient Stated Goals to gain info from provider    Currently in Pain? Yes    Pain Score 6     Pain Location Throat    Pain Orientation  Left    Pain Descriptors / Indicators Sore;Aching    Pain Type Chronic pain    Pain Onset More than a month ago    Pain Frequency Constant    Aggravating Factors  swallowing    Pain Relieving Factors pain meds    Effect of Pain on Daily Activities difficult to swallow                   LYMPHEDEMA/ONCOLOGY QUESTIONNAIRE - 04/04/21 0001       Head and Neck   4 cm superior to sternal notch around neck 43 cm (P)     6 cm superior to sternal notch around neck 43.3 cm (P)     8 cm superior to sternal notch around neck 46 cm (P)     Other 47.6 (P)    10 cm superior to sternal notch                       [facility] Adult PT Treatment/Exercise - 04/04/21 0001       Manual Therapy   Manual Lymphatic Drainage (MLD) In supine with head of bed elevated: short neck, 5 diaphragmatic breaths, bilateral axillary nodes, bilateral pectoral nodes, short neck, posterior,  lateral, and anterior neck moving fluid towards pathways                          PT Long Term Goals - 02/18/21 1059       PT LONG TERM GOAL #1   Title Pt will return to baseline cervical ROM and not demonstrate any signs or symptoms of lymphedema    Status On-going      PT LONG TERM GOAL #2   Title Pt will be ind with self MLD for the neck and educated on flexitouch    Time 4    Period Weeks    Status New      PT LONG TERM GOAL #3   Title Pt will obtain appropriate compression for long term use    Time 4    Period Weeks    Status New                   Plan - 04/04/21 1154     Clinical Impression Statement Pt reports over the last his neck swelling has been getting worse. He reports when he wakes up in the morning he has increased swelling in the anterior neck and increased thickness. He has been wearing his Marena head and neck garment for about 4 hours a day and has been doing self MLD but despite this he is having increased edema. Circumferential measurements taken today  have increased some at 4 cm proximal to the sternal notch and this is after pt had just removed his marena head and neck garment. He has been doing self MLD, exercises, and wearing garments for over 4 weeks with recent worsening of edema and increased thickening of anterior neck. He would benefit from a compression pump - the FlexiTouch for management of neck lymphedema since there currently is not a basic pump to address this. Continued with MLD to anterior neck today with decrease in edema in anterior neck visible by end of session.    PT Frequency 2x / week    PT Duration 4 weeks    PT Treatment/Interventions ADLs/Self Care Home Management;Therapeutic exercise;Patient/family education;Manual techniques;DME Instruction;Compression bandaging;Manual lymph drainage;Vasopneumatic Device;Taping;Passive range of motion    PT Next Visit Plan update POC, continue MLD with extra time at lateral neck especially on right;  did he get Flexitouch?    PT Home Exercise Plan head and neck ROM exercises, self MLD    Consulted and Agree with Plan of Care Patient             Patient will benefit from skilled therapeutic intervention in order to improve the following deficits and impairments:  Postural dysfunction, Pain, Decreased knowledge of precautions, Increased edema  Visit Diagnosis: Lymphedema, not elsewhere classified  Abnormal posture  Malignant neoplasm of tonsillar fossa (HCC)  Disorder of the skin and subcutaneous tissue related to radiation, unspecified     Problem List Patient Active Problem List   Diagnosis Date Noted   Mucositis due to antineoplastic therapy 03/31/2021   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 03/31/2021   Intractable nausea and vomiting 01/29/2021   Dehydration 01/29/2021   Pancytopenia ([place]) 01/29/2021   GERD (gastroesophageal reflux disease) 01/29/2021   Weight loss, unintentional 01/04/2021   Tinnitus 01/04/2021   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

## 2021-04-05 ENCOUNTER — Inpatient Hospital Stay: Payer: Managed Care, Other (non HMO)

## 2021-04-05 DIAGNOSIS — C109 Malignant neoplasm of oropharynx, unspecified: Secondary | ICD-10-CM

## 2021-04-05 DIAGNOSIS — R1310 Dysphagia, unspecified: Secondary | ICD-10-CM | POA: Diagnosis not present

## 2021-04-05 DIAGNOSIS — D539 Nutritional anemia, unspecified: Secondary | ICD-10-CM

## 2021-04-05 LAB — CBC WITH DIFFERENTIAL/PLATELET
Abs Immature Granulocytes: 0.01 10*3/uL (ref 0.00–0.07)
Basophils Absolute: 0 10*3/uL (ref 0.0–0.1)
Basophils Relative: 1 %
Eosinophils Absolute: 0.1 10*3/uL (ref 0.0–0.5)
Eosinophils Relative: 2 %
HCT: 32.7 % — ABNORMAL LOW (ref 39.0–52.0)
Hemoglobin: 11 g/dL — ABNORMAL LOW (ref 13.0–17.0)
Immature Granulocytes: 0 %
Lymphocytes Relative: 17 %
Lymphs Abs: 0.9 10*3/uL (ref 0.7–4.0)
MCH: 34 pg (ref 26.0–34.0)
MCHC: 33.6 g/dL (ref 30.0–36.0)
MCV: 100.9 fL — ABNORMAL HIGH (ref 80.0–100.0)
Monocytes Absolute: 0.3 10*3/uL (ref 0.1–1.0)
Monocytes Relative: 6 %
Neutro Abs: 3.8 10*3/uL (ref 1.7–7.7)
Neutrophils Relative %: 74 %
Platelets: 151 10*3/uL (ref 150–400)
RBC: 3.24 MIL/uL — ABNORMAL LOW (ref 4.22–5.81)
RDW: 13.1 % (ref 11.5–15.5)
WBC: 5.1 10*3/uL (ref 4.0–10.5)
nRBC: 0 % (ref 0.0–0.2)

## 2021-04-05 LAB — COMPREHENSIVE METABOLIC PANEL
ALT: 23 U/L (ref 0–44)
AST: 20 U/L (ref 15–41)
Albumin: 4 g/dL (ref 3.5–5.0)
Alkaline Phosphatase: 56 U/L (ref 38–126)
Anion gap: 8 (ref 5–15)
BUN: 7 mg/dL (ref 6–20)
CO2: 27 mmol/L (ref 22–32)
Calcium: 9.5 mg/dL (ref 8.9–10.3)
Chloride: 106 mmol/L (ref 98–111)
Creatinine, Ser: 0.94 mg/dL (ref 0.61–1.24)
GFR, Estimated: >60 mL/min (ref >60)
Glucose, Bld: 112 mg/dL — ABNORMAL HIGH (ref 70–99)
Potassium: 4 mmol/L (ref 3.5–5.1)
Sodium: 141 mmol/L (ref 135–145)
Total Bilirubin: 0.9 mg/dL (ref 0.3–1.2)
Total Protein: 6.5 g/dL (ref 6.5–8.1)

## 2021-04-05 LAB — VITAMIN B12: Vitamin B-12: 297 pg/mL (ref 180–914)

## 2021-04-05 LAB — IRON AND TIBC
Iron: 95 ug/dL (ref 45–182)
Saturation Ratios: 38 % (ref 17.9–39.5)
TIBC: 252 ug/dL (ref 250–450)
UIBC: 157 ug/dL

## 2021-04-05 LAB — FERRITIN: Ferritin: 797 ng/mL — ABNORMAL HIGH (ref 24–336)

## 2021-04-05 MED ORDER — HEPARIN SOD (PORK) LOCK FLUSH 100 UNIT/ML IV SOLN
500.0000 [IU] | Freq: Once | INTRAVENOUS | Status: AC | PRN
  Administered 2021-04-05: 500 [IU]

## 2021-04-05 MED ORDER — SODIUM CHLORIDE 0.9% FLUSH
10.0000 mL | Freq: Once | INTRAVENOUS | Status: AC | PRN
  Administered 2021-04-05: 10 mL

## 2021-04-06 LAB — FOLATE RBC
Folate, Hemolysate: 389 ng/mL
Folate, RBC: 1147 ng/mL (ref >498)
Hematocrit: 33.9 % — ABNORMAL LOW (ref 37.5–51.0)

## 2021-04-06 LAB — COPPER, SERUM: Copper: 80 ug/dL (ref 69–132)

## 2021-04-07 ENCOUNTER — Ambulatory Visit: Payer: Managed Care, Other (non HMO) | Attending: Radiation Oncology

## 2021-04-07 ENCOUNTER — Ambulatory Visit: Payer: Managed Care, Other (non HMO) | Admitting: Rehabilitation

## 2021-04-07 ENCOUNTER — Encounter: Payer: Self-pay | Admitting: Rehabilitation

## 2021-04-07 ENCOUNTER — Other Ambulatory Visit: Payer: Self-pay

## 2021-04-07 DIAGNOSIS — C090 Malignant neoplasm of tonsillar fossa: Secondary | ICD-10-CM

## 2021-04-07 DIAGNOSIS — L599 Disorder of the skin and subcutaneous tissue related to radiation, unspecified: Secondary | ICD-10-CM

## 2021-04-07 DIAGNOSIS — I890 Lymphedema, not elsewhere classified: Secondary | ICD-10-CM

## 2021-04-07 DIAGNOSIS — R1310 Dysphagia, unspecified: Secondary | ICD-10-CM | POA: Insufficient documentation

## 2021-04-07 DIAGNOSIS — R293 Abnormal posture: Secondary | ICD-10-CM

## 2021-04-07 DIAGNOSIS — R600 Localized edema: Secondary | ICD-10-CM

## 2021-04-07 NOTE — Therapy (Signed)
[place], [place], [address] Phone: [phone]   Fax:  [phone]  Physical Therapy Treatment  Patient Details  Name: Gerald [name] MRN: [id number] Date of Birth: Jun 05, 1971 Referring Provider (PT): Reita May Date: 04/07/2021   PT End of Session - 04/07/21 1143     Visit Number 10    Number of Visits 18    Date for PT Re-Evaluation 05/19/21    PT Start Time 1100    PT Stop Time 1144    PT Time Calculation (min) 44 min    Activity Tolerance Patient tolerated treatment well    Behavior During Therapy [facility] for tasks assessed/performed             Past Medical History:  Diagnosis Date   Cancer [facility])    Sleep apnea    Tonsillar mass 11/16/2020    Past Surgical History:  Procedure Laterality Date   DIRECT LARYNGOSCOPY N/A 11/22/2020   Procedure: DIRECT LARYNGOSCOPY WITH BIOPSY;  Surgeon: Leta Baptist, MD;  Location: [facility];  Service: ENT;  Laterality: N/A;   FOOT SURGERY  2000   extra nivicular removal   IR GASTROSTOMY TUBE MOD SED  12/24/2020   IR IMAGING GUIDED PORT INSERTION  12/24/2020   SPINE SURGERY     TONSILLECTOMY Left 11/22/2020   Procedure: LEFT TONSILLECTOMY;  Surgeon: Leta Baptist, MD;  Location: [place];  Service: ENT;  Laterality: Left;    There were no vitals filed for this visit.   Subjective Assessment - 04/07/21 1100     Subjective Haven't heard from flexitouch yet.    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  Hospitalized x 12 days for side effects has been out x 1-2 weeks now.  Still losing weight.    Currently in Pain? Yes    Pain Score 6     Pain Location Throat    Pain Orientation Left    Pain Descriptors / Indicators Sore    Pain Type Chronic pain                               [facility] Adult PT Treatment/Exercise - 04/07/21 0001        Manual Therapy   Manual Lymphatic Drainage (MLD) In supine with head of bed elevated: short neck, 5 diaphragmatic breaths, bilateral axillary nodes, bilateral pectoral nodes, short neck, posterior, lateral, and anterior neck moving fluid towards pathways                          PT Long Term Goals - 02/18/21 1059       PT LONG TERM GOAL #1   Title Pt will return to baseline cervical ROM and not demonstrate any signs or symptoms of lymphedema    Status On-going      PT LONG TERM GOAL #2   Title Pt will be ind with self MLD for the neck and educated on flexitouch    Time 4    Period Weeks    Status New      PT LONG TERM GOAL #3   Title Pt will obtain appropriate compression for long term use    Time 4    Period Weeks    Status New  Plan - 04/07/21 1144     Clinical Impression Statement Pt continues with good self care using garment 4 hours per day and consistent self MLD with feelings of worsening AM edema and stiffness.  Awaiting flexitouch for self management so will extend POC until ind with this.    PT Frequency 2x / week    PT Duration 4 weeks    PT Treatment/Interventions ADLs/Self Care Home Management;Therapeutic exercise;Patient/family education;Manual techniques;DME Instruction;Compression bandaging;Manual lymph drainage;Vasopneumatic Device;Taping;Passive range of motion    PT Next Visit Plan continue MLD with extra time at lateral neck especially on right;  did he hear from Flexitouch?    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 03/31/2021   Intractable nausea and vomiting 01/29/2021   Dehydration 01/29/2021   Pancytopenia ([place]) 01/29/2021   GERD (gastroesophageal reflux disease) 01/29/2021   Weight loss, unintentional 01/04/2021   Tinnitus 01/04/2021   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

## 2021-04-07 NOTE — Therapy (Signed)
[facility] [address] [place], [place], [address] Phone: [phone]   Fax:  [phone]  Speech Language Pathology Treatment/Renewal Summary  Patient Details  Name: Gerald [name] MRN: [id number] Date of Birth: 11-16-1970 Referring Provider (SLP): Eppie Gibson, MD   Encounter Date: 04/07/2021   End of Session - 04/07/21 1056     Visit Number 4    Number of Visits 7    Date for SLP Re-Evaluation 07/06/21    SLP Start Time 0937    SLP Stop Time  1011    SLP Time Calculation (min) 34 min    Activity Tolerance Patient tolerated treatment well             Past Medical History:  Diagnosis Date   Cancer ([place])    Sleep apnea    Tonsillar mass 11/16/2020    Past Surgical History:  Procedure Laterality Date   DIRECT LARYNGOSCOPY N/A 11/22/2020   Procedure: DIRECT LARYNGOSCOPY WITH BIOPSY;  Surgeon: Leta Baptist, MD;  Location: [facility];  Service: ENT;  Laterality: N/A;   FOOT SURGERY  2000   extra nivicular removal   IR GASTROSTOMY TUBE MOD SED  12/24/2020   IR IMAGING GUIDED PORT INSERTION  12/24/2020   SPINE SURGERY     TONSILLECTOMY Left 11/22/2020   Procedure: LEFT TONSILLECTOMY;  Surgeon: Leta Baptist, MD;  Location: [facility];  Service: ENT;  Laterality: Left;    There were no vitals filed for this visit.   Subjective Assessment - 04/07/21 0947     Subjective "I'm not limited by texture, I'm limited by taste."    Currently in Pain? Yes    Pain Score 6     Pain Location Throat    Pain Orientation Left    Pain Descriptors / Indicators Sore    Pain Type Chronic pain    Pain Onset More than a month ago                   ADULT SLP TREATMENT - 04/07/21 0950       General Information   Behavior/Cognition Alert;Cooperative;Pleasant mood      Treatment Provided   Treatment provided Dysphagia      Dysphagia Treatment   Temperature Spikes Noted No    Respiratory  Status Room air    Oral Cavity - Dentition Adequate natural dentition    Patient observed directly with PO's Yes    Type of PO's observed Dysphagia 3 (soft);Thin liquids    Liquids provided via --   bottle   Oral Phase Signs & Symptoms --   none noted today   Pharyngeal Phase Signs & Symptoms Other (comment)   none today   Other treatment/comments Pt has done three of the HEP due to losing his sheet - however today when provided with the sheet he eventually completed HEP with modified indpendence. Educated pt on how to make exercises more difficult (incr reps and/or hold times) nad pt demonstrated understanding.      Assessment / Recommendations / Plan   Plan --   see pt in November     Dysphagia Recommendations   Diet recommendations --   as tolerated./thin   Liquids provided via Cup    Medication Administration --   as tolerated   Compensations Small sips/bites      Progression Toward Goals   Progression toward goals Progressing toward goals  SLP Education - 04/07/21 1052     Education Details Procedure for HEP, how to make HEP more difficult, overt s/sx aspiration PNA    Person(s) Educated Patient    Methods Explanation;Demonstration;Handout;Verbal cues    Comprehension Returned demonstration;Verbal cues required;Verbalized understanding              SLP Short Term Goals - 04/07/21 1006       SLP SHORT TERM GOAL #1   Title pt will tell SLP why pt is completing HEP with modified independence    Period --   visits, for all STGs   Status Achieved      SLP SHORT TERM GOAL #2   Title pt will complete HEP with rare min A in 2 sessions    Baseline 03-10-21    Status Achieved      SLP SHORT TERM GOAL #3   Title pt will describe 3 overt s/s aspiration PNA with modified independence    Status Achieved      SLP SHORT TERM GOAL #4   Title pt will tell SLP how a food journal could hasten return to a more normalized diet    Status Deferred               SLP Long Term Goals - 04/07/21 1007       SLP LONG TERM GOAL #1   Title pt will complete HEP with modified independence over three visits    Baseline 04-07-21    Time 3    Period --   visits, for all LTGs   Status On-going      SLP LONG TERM GOAL #2   Title pt will complete HEP with independence over two visits    Time --    Status Deferred   duplicate of LTG #1     SLP LONG TERM GOAL #3   Title pt will describe how to modify HEP over time, and the timeline associated with reduction in HEP frequency with modified independence over two sessions    Status Achieved              Plan - 04/07/21 1100     Clinical Impression Statement Pt appears safe with dysphagia III diet and thin liquids - pt prefers smaller sips and SLP agrees. Pt has had some regular diet items but states POs are limited mostly by taste at this time. SLP reveiewed pt's individualized HEP for dysphagia however pt did not complete due to n/v. There are no overt s/s aspiration PNA observed, and none reported by pt at this time. He had lost his HEP so was only doing the 3 exercises he could recall. SLP provided new HEP for him today. Data indicate that pt's swallow ability will likely decrease over the course of radiation therapy and could very well decline over time following conclusion of their radiation therapy due to muscle disuse atrophy and/or muscle fibrosis. Pt will cont to need to be seen by SLP, but could do so every other month at this time, in order to assess safety of PO intake, assess the need for recommending any objective swallow assessment, and ensuring pt correctly completes the individualized HEP.    Speech Therapy Frequency --   once approx every 4 weeks   Duration --   90 days/12 weeks   Treatment/Interventions Aspiration precaution training;Pharyngeal strengthening exercises;Diet toleration management by SLP;Trials of upgraded texture/liquids;Internal/external aids;Patient/family education;SLP  instruction and feedback;Compensatory strategies    Potential to Achieve Goals Good  SLP Home Exercise Plan provided today    Consulted and Agree with Plan of Care Patient             Patient will benefit from skilled therapeutic intervention in order to improve the following deficits and impairments:   Dysphagia, unspecified type    Problem List Patient Active Problem List   Diagnosis Date Noted   Mucositis due to antineoplastic therapy 03/31/2021   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 03/31/2021   Intractable nausea and vomiting 01/29/2021   Dehydration 01/29/2021   Pancytopenia ([place]) 01/29/2021   GERD (gastroesophageal reflux disease) 01/29/2021   Weight loss, unintentional 01/04/2021   Tinnitus 01/04/2021   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 [place], [place], [address] Phone: [phone]   Fax:  [phone]   Name: Gerald [name] MRN: [id number] Date of Birth: 1971/05/28

## 2021-04-11 ENCOUNTER — Ambulatory Visit (HOSPITAL_COMMUNITY)
Admission: RE | Admit: 2021-04-11 | Discharge: 2021-04-11 | Disposition: A | Discharge status: Home / Self Care | Payer: Managed Care, Other (non HMO) | Source: Ambulatory Visit | Attending: Hematology and Oncology | Admitting: Hematology and Oncology

## 2021-04-11 ENCOUNTER — Ambulatory Visit: Payer: Managed Care, Other (non HMO) | Admitting: Physical Therapy

## 2021-04-11 ENCOUNTER — Other Ambulatory Visit: Payer: Self-pay | Admitting: Hematology and Oncology

## 2021-04-11 ENCOUNTER — Other Ambulatory Visit: Payer: Self-pay

## 2021-04-11 DIAGNOSIS — L599 Disorder of the skin and subcutaneous tissue related to radiation, unspecified: Secondary | ICD-10-CM

## 2021-04-11 DIAGNOSIS — Z431 Encounter for attention to gastrostomy: Secondary | ICD-10-CM | POA: Insufficient documentation

## 2021-04-11 DIAGNOSIS — I890 Lymphedema, not elsewhere classified: Secondary | ICD-10-CM

## 2021-04-11 DIAGNOSIS — C109 Malignant neoplasm of oropharynx, unspecified: Secondary | ICD-10-CM | POA: Insufficient documentation

## 2021-04-11 DIAGNOSIS — C090 Malignant neoplasm of tonsillar fossa: Secondary | ICD-10-CM

## 2021-04-11 DIAGNOSIS — R293 Abnormal posture: Secondary | ICD-10-CM

## 2021-04-11 HISTORY — PX: IR GASTROSTOMY TUBE REMOVAL: IMG5492

## 2021-04-11 MED ORDER — LIDOCAINE VISCOUS HCL 2 % MT SOLN
OROMUCOSAL | Status: DC | PRN
  Administered 2021-04-11: 6 mL

## 2021-04-11 MED ORDER — LIDOCAINE VISCOUS HCL 2 % MT SOLN
OROMUCOSAL | Status: AC
  Filled 2021-04-11: qty 15

## 2021-04-11 NOTE — Therapy (Signed)
[place], [place], [address] Phone: [phone]   Fax:  [phone]  Physical Therapy Treatment  Patient Details  Name: Gerald [name] MRN: [id number] Date of Birth: [birth year] Referring Provider (PT): Reita May Date: 04/11/2021   PT End of Session - 04/11/21 1652     Visit Number 11    Number of Visits 18    Date for PT Re-Evaluation 05/19/21    PT Start Time 1601    PT Stop Time 1651    PT Time Calculation (min) 50 min    Activity Tolerance Patient tolerated treatment well    Behavior During Therapy [facility] for tasks assessed/performed             Past Medical History:  Diagnosis Date   Cancer [facility])    Sleep apnea    Tonsillar mass 11/16/2020    Past Surgical History:  Procedure Laterality Date   DIRECT LARYNGOSCOPY N/A 11/22/2020   Procedure: DIRECT LARYNGOSCOPY WITH BIOPSY;  Surgeon: Leta Baptist, MD;  Location: [facility];  Service: ENT;  Laterality: N/A;   FOOT SURGERY  2000   extra nivicular removal   IR GASTROSTOMY TUBE MOD SED  12/24/2020   IR GASTROSTOMY TUBE REMOVAL  04/11/2021   IR IMAGING GUIDED PORT INSERTION  12/24/2020   SPINE SURGERY     TONSILLECTOMY Left 11/22/2020   Procedure: LEFT TONSILLECTOMY;  Surgeon: Leta Baptist, MD;  Location: [facility];  Service: ENT;  Laterality: Left;    There were no vitals filed for this visit.   Subjective Assessment - 04/11/21 1603     Subjective I got my feeding tube removed today.    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  Hospitalized x 12 days for side effects has been out x 1-2 weeks now.  Still losing weight.    Patient Stated Goals to gain info from provider    Currently in Pain? Yes    Pain Score 4     Pain Location Abdomen    Pain Descriptors / Indicators Sore    Pain Type Acute pain    Pain Onset Today    Pain  Frequency Constant    Aggravating Factors  slouching    Pain Relieving Factors sitting up straight                               [facility] Adult PT Treatment/Exercise - 04/11/21 0001       Manual Therapy   Manual Lymphatic Drainage (MLD) In supine with head of bed elevated: short neck, 5 diaphragmatic breaths, bilateral axillary nodes, bilateral pectoral nodes, short neck, posterior, lateral, and anterior neck moving fluid towards pathways                          PT Long Term Goals - 02/18/21 1059       PT LONG TERM GOAL #1   Title Pt will return to baseline cervical ROM and not demonstrate any signs or symptoms of lymphedema    Status On-going      PT LONG TERM GOAL #2   Title Pt will be ind with self MLD for the neck and educated on flexitouch    Time 4    Period Weeks    Status New  PT LONG TERM GOAL #3   Title Pt will obtain appropriate compression for long term use    Time 4    Period Weeks    Status New                   Plan - 04/11/21 1653     Clinical Impression Statement Continued with MLD today to anterior neck to reduce swelling. Pt presented to PT wearing his head and neck compression garment. He reports his swelling gets worse after he takes his garment off. Pt is awaiting to hear from [facility] on insurance coverage. He would benefit from a head and neck compression pump for long term management of his lymphedema.    PT Frequency 2x / week    PT Duration 4 weeks    PT Treatment/Interventions ADLs/Self Care Home Management;Therapeutic exercise;Patient/family education;Manual techniques;DME Instruction;Compression bandaging;Manual lymph drainage;Vasopneumatic Device;Taping;Passive range of motion    PT Next Visit Plan continue MLD with extra time at lateral neck especially on right;  did he hear from Flexitouch?    PT Home Exercise Plan head and neck ROM exercises, self MLD    Consulted and Agree with Plan of Care  Patient             Patient will benefit from skilled therapeutic intervention in order to improve the following deficits and impairments:  Postural dysfunction, Pain, Decreased knowledge of precautions, Increased edema  Visit Diagnosis: Lymphedema, not elsewhere classified  Abnormal posture  Disorder of the skin and subcutaneous tissue related to radiation, unspecified  Malignant neoplasm of tonsillar fossa [facility])     Problem List Patient Active Problem List   Diagnosis Date Noted   Mucositis due to antineoplastic therapy 03/31/2021   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 03/31/2021   Intractable nausea and vomiting 01/29/2021   Dehydration 01/29/2021   Pancytopenia ([place]) 01/29/2021   GERD (gastroesophageal reflux disease) 01/29/2021   Weight loss, unintentional 01/04/2021   Tinnitus 01/04/2021   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

## 2021-04-11 NOTE — Procedures (Signed)
  Successful bedside removal of intact pull through G-tube. No immediate post procedural complications. Gauze dressing placed over site.  Pt provided with care instructions and verbalized understanding of care.  EBL <1cc  Tyson Alias, NP 04/11/2021 9:41 AM

## 2021-04-12 ENCOUNTER — Telehealth: Payer: Self-pay | Admitting: Nutrition

## 2021-04-12 ENCOUNTER — Inpatient Hospital Stay: Payer: Managed Care, Other (non HMO) | Admitting: Nutrition

## 2021-04-12 NOTE — Telephone Encounter (Signed)
Nutrition follow-up attempted by telephone.  There was no answer but I left a message.  Patient's G-tube was removed yesterday, September 19.  Weight has improved from 213.3 on August 11-218.3 September 8.  Provided support and encouragement for patient to continue strategies for adequate calorie and protein intake.  Recommended patient give me a call if he has any questions or concerns regarding oral intake.  No follow-up scheduled.

## 2021-04-13 ENCOUNTER — Encounter: Payer: Self-pay | Admitting: Physical Therapy

## 2021-04-13 ENCOUNTER — Ambulatory Visit: Payer: Managed Care, Other (non HMO) | Admitting: Physical Therapy

## 2021-04-13 ENCOUNTER — Other Ambulatory Visit: Payer: Self-pay

## 2021-04-13 DIAGNOSIS — R1310 Dysphagia, unspecified: Secondary | ICD-10-CM | POA: Diagnosis not present

## 2021-04-13 DIAGNOSIS — R293 Abnormal posture: Secondary | ICD-10-CM

## 2021-04-13 DIAGNOSIS — L599 Disorder of the skin and subcutaneous tissue related to radiation, unspecified: Secondary | ICD-10-CM

## 2021-04-13 DIAGNOSIS — C090 Malignant neoplasm of tonsillar fossa: Secondary | ICD-10-CM

## 2021-04-13 DIAGNOSIS — I890 Lymphedema, not elsewhere classified: Secondary | ICD-10-CM

## 2021-04-13 NOTE — Therapy (Addendum)
[place], [place], [address] Phone: [phone]   Fax:  [phone]  Physical Therapy Treatment  Patient Details  Name: Gerald [name] MRN: [id number] Date of Birth: 30-Aug-1970 Referring Provider (PT): Reita May Date: 04/13/2021   PT End of Session - 04/13/21 1052     Visit Number 12    Number of Visits 18    Date for PT Re-Evaluation 05/19/21    PT Start Time 1008    PT Stop Time 1051    PT Time Calculation (min) 43 min    Activity Tolerance Patient tolerated treatment well    Behavior During Therapy [facility] for tasks assessed/performed             Past Medical History:  Diagnosis Date   Cancer ([place])    Sleep apnea    Tonsillar mass 11/16/2020    Past Surgical History:  Procedure Laterality Date   DIRECT LARYNGOSCOPY N/A 11/22/2020   Procedure: DIRECT LARYNGOSCOPY WITH BIOPSY;  Surgeon: Leta Baptist, MD;  Location: [facility];  Service: ENT;  Laterality: N/A;   FOOT SURGERY  2000   extra nivicular removal   IR GASTROSTOMY TUBE MOD SED  12/24/2020   IR GASTROSTOMY TUBE REMOVAL  04/11/2021   IR IMAGING GUIDED PORT INSERTION  12/24/2020   SPINE SURGERY     TONSILLECTOMY Left 11/22/2020   Procedure: LEFT TONSILLECTOMY;  Surgeon: Leta Baptist, MD;  Location: [facility];  Service: ENT;  Laterality: Left;    There were no vitals filed for this visit.   Subjective Assessment - 04/13/21 1009     Subjective When I woke up it was worse and my ear and side of my face is hurting worse.    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  Hospitalized x 12 days for side effects has been out x 1-2 weeks now.  Still losing weight.    Patient Stated Goals to gain info from provider    Currently in Pain? Yes    Pain Score 7     Pain Location Ear    Pain Orientation Left    Pain Descriptors / Indicators  Sharp    Pain Type Acute pain    Pain Onset In the past 7 days    Pain Frequency Constant    Aggravating Factors  eating    Pain Relieving Factors nothing    Effect of Pain on Daily Activities limiting ADLs    Multiple Pain Sites Yes    Pain Location Throat    Pain Descriptors / Indicators Sore    Pain Type Acute pain    Pain Onset More than a month ago    Pain Frequency Constant    Aggravating Factors  swallowing    Pain Relieving Factors nothing    Effect of Pain on Daily Activities no effect                               [facility] Adult PT Treatment/Exercise - 04/13/21 0001       Neck Exercises: Stretches   Other Neck Stretches Issued pt the following exercises as part of an HEP and had him return demonstrate: seated scalene stretch with overpressure on clavicle, seated SCM stretch, seated upper trap stretch      Manual Therapy   Manual Therapy Soft tissue mobilization  Soft tissue mobilization to L scalenes, SCM, masseter, upper trap and levator with increased tightness noted throughout                          PT Long Term Goals - 02/18/21 1059       PT LONG TERM GOAL #1   Title Pt will return to baseline cervical ROM and not demonstrate any signs or symptoms of lymphedema    Status On-going      PT LONG TERM GOAL #2   Title Pt will be ind with self MLD for the neck and educated on flexitouch    Time 4    Period Weeks    Status New      PT LONG TERM GOAL #3   Title Pt will obtain appropriate compression for long term use    Time 4    Period Weeks    Status New                   Plan - 04/13/21 1053     Clinical Impression Statement Pt having increased neck and jaw pain today. He had increased muscle tightness in SCM and scalenes. Focused today on soft tissue mobilization to help decrease tightness and allow improved pain. Pt reports his ear pain felt some better by end of session. Educated pt in neck stretches to  help decrease tightness in these muscles and issued this as part of an HEP. If pt is continuing to have jaw pain at next session will issue jaw exercises for TMJ to see if this helps.    PT Frequency 2x / week    PT Duration 4 weeks    PT Treatment/Interventions ADLs/Self Care Home Management;Therapeutic exercise;Patient/family education;Manual techniques;DME Instruction;Compression bandaging;Manual lymph drainage;Vasopneumatic Device;Taping;Passive range of motion    PT Next Visit Plan see if STM helped decrease neck and jaw pain, give TMJ exercises if pt still having jaw pain, continue MLD with extra time at lateral neck especially on right;  did he hear from Flexitouch?    PT Home Exercise Plan head and neck ROM exercises, self MLD,Access Code: [email or web address]    Consulted and Agree with Plan of Care Patient             Patient will benefit from skilled therapeutic intervention in order to improve the following deficits and impairments:  Postural dysfunction, Pain, Decreased knowledge of precautions, Increased edema  Visit Diagnosis: Lymphedema, not elsewhere classified  Abnormal posture  Disorder of the skin and subcutaneous tissue related to radiation, unspecified  Malignant neoplasm of tonsillar fossa [facility])     Problem List Patient Active Problem List   Diagnosis Date Noted   Mucositis due to antineoplastic therapy 03/31/2021   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 03/31/2021   Intractable nausea and vomiting 01/29/2021   Dehydration 01/29/2021   Pancytopenia ([place]) 01/29/2021   GERD (gastroesophageal reflux disease) 01/29/2021   Weight loss, unintentional 01/04/2021   Tinnitus 01/04/2021   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, PT 04/13/2021, 11:03 AM  [place], [place], [address] Phone: [phone]  Fax:  [phone]  Name: Gerald [name] MRN: [id number] Date of Birth: [birth year]   Manus Gunning, PT 04/13/21 11:03 AM  PHYSICAL THERAPY DISCHARGE SUMMARY  Visits from Start of Care: 12  Current functional level related to goals / functional outcomes: See above   Remaining deficits: See above   Education / Equipment: MLD, HEP, compression garment   Patient agrees to discharge. Patient goals were met. Patient is being discharged due to meeting the stated rehab goals.  [name] [place], [place] 06/07/22 3:31 PM

## 2021-04-13 NOTE — Patient Instructions (Signed)
Access Code: [email or web address] URL: [email or web address] Date: 04/13/2021 Prepared by: Manus Gunning  Program Notes Seated scalene stretch: pull down on left collar bone then sidebend neck to opposite side and look upwards  holds 30-60 seconds and do 5-10 times   Exercises Sternocleidomastoid Stretch (Mirrored) - 1 x daily - 7 x weekly - 1 sets - 5-10 reps - 30-60 sec hold Seated Cervical Sidebending Stretch (Mirrored) - 1 x daily - 7 x weekly - 1 sets - 5-10 reps - 30-60 seconds hold

## 2021-05-06 ENCOUNTER — Other Ambulatory Visit: Payer: Self-pay | Admitting: *Deleted

## 2021-05-06 DIAGNOSIS — D539 Nutritional anemia, unspecified: Secondary | ICD-10-CM

## 2021-05-11 ENCOUNTER — Other Ambulatory Visit: Payer: Self-pay

## 2021-05-11 ENCOUNTER — Inpatient Hospital Stay: Payer: Managed Care, Other (non HMO) | Attending: Hematology and Oncology

## 2021-05-11 ENCOUNTER — Other Ambulatory Visit: Payer: Self-pay | Admitting: Hematology and Oncology

## 2021-05-11 DIAGNOSIS — D7589 Other specified diseases of blood and blood-forming organs: Secondary | ICD-10-CM | POA: Diagnosis present

## 2021-05-11 DIAGNOSIS — Z452 Encounter for adjustment and management of vascular access device: Secondary | ICD-10-CM | POA: Insufficient documentation

## 2021-05-11 DIAGNOSIS — C109 Malignant neoplasm of oropharynx, unspecified: Secondary | ICD-10-CM

## 2021-05-11 DIAGNOSIS — D539 Nutritional anemia, unspecified: Secondary | ICD-10-CM

## 2021-05-11 DIAGNOSIS — D649 Anemia, unspecified: Secondary | ICD-10-CM | POA: Insufficient documentation

## 2021-05-11 LAB — COMPREHENSIVE METABOLIC PANEL
ALT: 12 U/L (ref 0–44)
AST: 19 U/L (ref 15–41)
Albumin: 4 g/dL (ref 3.5–5.0)
Alkaline Phosphatase: 55 U/L (ref 38–126)
Anion gap: 10 (ref 5–15)
BUN: 9 mg/dL (ref 6–20)
CO2: 23 mmol/L (ref 22–32)
Calcium: 9 mg/dL (ref 8.9–10.3)
Chloride: 106 mmol/L (ref 98–111)
Creatinine, Ser: 0.95 mg/dL (ref 0.61–1.24)
GFR, Estimated: >60 mL/min (ref >60)
Glucose, Bld: 89 mg/dL (ref 70–99)
Potassium: 3.7 mmol/L (ref 3.5–5.1)
Sodium: 139 mmol/L (ref 135–145)
Total Bilirubin: 1.7 mg/dL — ABNORMAL HIGH (ref 0.3–1.2)
Total Protein: 6.4 g/dL — ABNORMAL LOW (ref 6.5–8.1)

## 2021-05-11 LAB — CBC WITH DIFFERENTIAL/PLATELET
Abs Immature Granulocytes: 0.01 10*3/uL (ref 0.00–0.07)
Basophils Absolute: 0 10*3/uL (ref 0.0–0.1)
Basophils Relative: 1 %
Eosinophils Absolute: 0.1 10*3/uL (ref 0.0–0.5)
Eosinophils Relative: 4 %
HCT: 27.6 % — ABNORMAL LOW (ref 39.0–52.0)
Hemoglobin: 9.9 g/dL — ABNORMAL LOW (ref 13.0–17.0)
Immature Granulocytes: 0 %
Lymphocytes Relative: 18 %
Lymphs Abs: 0.7 10*3/uL (ref 0.7–4.0)
MCH: 35.1 pg — ABNORMAL HIGH (ref 26.0–34.0)
MCHC: 35.9 g/dL (ref 30.0–36.0)
MCV: 97.9 fL (ref 80.0–100.0)
Monocytes Absolute: 0.2 10*3/uL (ref 0.1–1.0)
Monocytes Relative: 7 %
Neutro Abs: 2.6 10*3/uL (ref 1.7–7.7)
Neutrophils Relative %: 70 %
Platelets: 150 10*3/uL (ref 150–400)
RBC: 2.82 MIL/uL — ABNORMAL LOW (ref 4.22–5.81)
RDW: 14.1 % (ref 11.5–15.5)
WBC: 3.7 10*3/uL — ABNORMAL LOW (ref 4.0–10.5)
nRBC: 0 % (ref 0.0–0.2)

## 2021-05-11 LAB — IRON AND TIBC
Iron: 94 ug/dL (ref 42–163)
Saturation Ratios: 48 % (ref 20–55)
TIBC: 198 ug/dL — ABNORMAL LOW (ref 202–409)
UIBC: 103 ug/dL — ABNORMAL LOW (ref 117–376)

## 2021-05-11 LAB — FERRITIN: Ferritin: 582 ng/mL — ABNORMAL HIGH (ref 24–336)

## 2021-05-11 MED ORDER — HEPARIN SOD (PORK) LOCK FLUSH 100 UNIT/ML IV SOLN
500.0000 [IU] | Freq: Once | INTRAVENOUS | Status: AC | PRN
  Administered 2021-05-11: 500 [IU]

## 2021-05-11 MED ORDER — SODIUM CHLORIDE 0.9% FLUSH
10.0000 mL | Freq: Once | INTRAVENOUS | Status: AC | PRN
  Administered 2021-05-11: 10 mL

## 2021-05-19 ENCOUNTER — Telehealth: Payer: Self-pay

## 2021-05-19 NOTE — Telephone Encounter (Signed)
-----   Message from Estella Husk, LPN sent at 11/55/2080 10:38 AM EDT -----  ----- Message ----- From: Merril Abbe, LPN Sent: 22/33/6122  12:34 PM EDT To: Estella Husk, LPN   ----- Message ----- From: Benay Pike, MD Sent: 05/11/2021  12:37 PM EDT To: Merril Abbe, LPN  His hemoglobin is lower today. No indication for blood transfusion. He doesn't have iron deficiency. I would recommend repeat CBC in 4 weeks, will order this and FU with Mendel Ryder in 4 weeks. Can you help schedule this lab and let him know.  Thanks,

## 2021-05-19 NOTE — Telephone Encounter (Signed)
Called Gerald [name] to let him know what Dr. Chryl Heck had relayed about his hemoglobin and iron deficiency. 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  He is scheduled for labs and to see Mendel Ryder on 11/16 and I reminded him on those appt times.  He is doing well and will call us if he has any concerns before that. Gardiner Rhyme, RN

## 2021-05-25 ENCOUNTER — Ambulatory Visit (HOSPITAL_COMMUNITY)
Admission: RE | Admit: 2021-05-25 | Discharge: 2021-05-25 | Disposition: A | Discharge status: Home / Self Care | Payer: Managed Care, Other (non HMO) | Source: Ambulatory Visit | Attending: Radiation Oncology | Admitting: Radiation Oncology

## 2021-05-25 DIAGNOSIS — C090 Malignant neoplasm of tonsillar fossa: Secondary | ICD-10-CM | POA: Diagnosis present

## 2021-05-25 LAB — GLUCOSE, CAPILLARY: Glucose-Capillary: 99 mg/dL (ref 70–99)

## 2021-05-25 MED ORDER — FLUDEOXYGLUCOSE F - 18 (FDG) INJECTION
11.0000 | Freq: Once | INTRAVENOUS | Status: AC
  Administered 2021-05-25: 10.37 via INTRAVENOUS

## 2021-05-27 ENCOUNTER — Other Ambulatory Visit: Payer: Self-pay

## 2021-05-27 ENCOUNTER — Ambulatory Visit
Admission: RE | Admit: 2021-05-27 | Discharge: 2021-05-27 | Disposition: A | Discharge status: Home / Self Care | Payer: Managed Care, Other (non HMO) | Source: Ambulatory Visit | Attending: Radiation Oncology | Admitting: Radiation Oncology

## 2021-05-27 VITALS — BP 103/87 | HR 62 | Temp 97.6°F | Resp 20 | Ht 69.0 in | Wt 214.8 lb

## 2021-05-27 DIAGNOSIS — Z923 Personal history of irradiation: Secondary | ICD-10-CM | POA: Diagnosis not present

## 2021-05-27 DIAGNOSIS — C109 Malignant neoplasm of oropharynx, unspecified: Secondary | ICD-10-CM | POA: Diagnosis not present

## 2021-05-27 DIAGNOSIS — C090 Malignant neoplasm of tonsillar fossa: Secondary | ICD-10-CM

## 2021-05-27 DIAGNOSIS — R9389 Abnormal findings on diagnostic imaging of other specified body structures: Secondary | ICD-10-CM

## 2021-05-27 DIAGNOSIS — R609 Edema, unspecified: Secondary | ICD-10-CM | POA: Diagnosis not present

## 2021-05-27 NOTE — Progress Notes (Signed)
Oncology Nurse Navigator Documentation   I met with Gerald [name] and his wife during his follow up with Dr. Isidore Moos for results of his recent PET scan. He is doing well and denies any questions at this time. He will see Gerald Bihari NP in about two weeks for repeat labs and assessment. He will see Dr. Isidore Moos in 6 months and I will refer him to see [facility] at his request for future ENT follow ups. He knows to call me if there is anything he needs.   Harlow Asa RN, BSN, OCN Head & Neck Oncology Nurse [place] [facility] Phone # [phone]  Fax # [phone]

## 2021-05-27 NOTE — Progress Notes (Addendum)
Gerald [name] presents today for follow-up after completing radiation prematurely to his left tonsil and bilateral neck on 02/11/2021, and to review PET scan results from 05/25/2021  Pain issues, if any: Reports continued throat pain (4 out of 10) that is unrelieved with OTC pain medication Using a feeding tube?: N/A--removed 04/11/2021 Weight changes, if any:  Wt Readings from Last 3 Encounters:  05/27/21 214 lb 12.8 oz (97.4 kg)  03/31/21 218 lb 4.8 oz (99 kg)  03/23/21 216 lb 6.4 oz (98.2 kg)   Swallowing issues, if any: Patient denies, but does admit he avoids breads and drier foods. Denies any  issues with thin liquids. Smoking or chewing tobacco? None Using fluoride trays daily? N/A--denies any dental concerns Last ENT visit was on: Not since diagnosis Other notable issues, if any: Denies any ear pain (unless lymphemda is very prominenat, then he experiences inner ear discomfort) or jaw pain. Reports he wakes up with throat pressure/discomfort from lymphedema. States dry mouth and thick saliva have improved greatly. Sense of taste is slowly returning. Continues to experience fatigue (feels this may be more related to anemia). Reports a warm/tingling sensation that occurs when he's sitting and looks downward (feels from lower back down backs of knees). States it does not seem to occur when he's standing and looks down. Denies any pain or weakness when it occurs.

## 2021-05-30 ENCOUNTER — Encounter: Payer: Self-pay | Admitting: Radiation Oncology

## 2021-05-30 NOTE — Progress Notes (Addendum)
Radiation Oncology         [phone] ________________________________  Name: Gerald [name] MRN: [id number]  Date: 05/27/2021  DOB: 1971-03-13  Follow-Up Visit Note  CC: Pcp, No  Pa, [facility]*  Diagnosis and Prior Radiotherapy:       ICD-10-CM   1. 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 Cancer of tonsillar fossa (HCC)  C09.0      Cancer Staging Cancer of tonsillar fossa ([place]) Staging form: Pharynx - HPV-Mediated Oropharynx, AJCC 8th Edition - Clinical stage from 12/07/2020: Stage I (cT2, cN1, cM0, p16+) - Signed by Gerald Gibson, MD on 12/07/2020 Stage prefix: Initial diagnosis  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[place]) Staging form: Pharynx - HPV-Mediated Oropharynx, AJCC 8th Edition - Clinical stage from 12/06/2020: Stage I (cT2, cN1, cM0, p16+) - Signed by Gerald Pike, MD on 12/06/2020 Stage prefix: Initial diagnosis  Radiation treatment dates:   12/28/20 to 02/11/21  Site/dose:   Left tonsil and bilateral neck / received 62 Gy in 31 fractions (treatment stopped prematurely, 70Gy had been planned)  Technique:   IMRT/VMAT with 6X photons  CHIEF COMPLAINT:  Here for follow-up and surveillance of tonsil cancer  Narrative:   [name] presents today for follow-up after stopping radiation prematurely to his left tonsil and bilateral neck on 02/11/2021, and to review PET scan results from 05/25/2021  Pain issues, if any: Reports continued throat pain (4 out of 10) that is unrelieved with OTC pain medication Using a feeding tube?: N/A--removed 04/11/2021 Weight changes, if any:  Wt Readings from Last 3 Encounters:  05/27/21 214 lb 12.8 oz (97.4 kg)  03/31/21 218 lb 4.8 oz (99 kg)  03/23/21 216 lb 6.4 oz (98.2 kg)   Swallowing issues, if any: Patient denies, but does admit he avoids breads and drier foods. Denies any  issues with thin liquids. Smoking or chewing tobacco? None Using fluoride trays daily? N/A--denies any dental concerns Last  ENT visit was on: Not since diagnosis Other notable issues, if any: Denies any ear pain (unless lymphedema and neck is very prominent then he experiences inner ear discomfort) or jaw pain. Reports he wakes up with throat pressure/discomfort from lymphedema. States dry mouth and thick saliva have improved greatly. Sense of taste is slowly returning. Continues to experience fatigue (feels this may be more related to anemia). Reports a warm/tingling sensation that occurs when he's sitting and looks downward (feels from lower back down backs of knees). States it does not seem to occur when he's standing and looks down. Denies any pain or weakness when it occurs.    ALLERGIES:  has No Known Allergies.  Meds: Current Outpatient Medications  Medication Sig Dispense Refill   HYDROcodone-acetaminophen (NORCO/VICODIN) 5-325 MG tablet Take 1 tablet by mouth every 12 (twelve) hours as needed for severe pain (mucositis related pain). (Patient not taking: Reported on 05/27/2021) 60 tablet 0   scopolamine (TRANSDERM-SCOP) 1 MG/3DAYS Place 1 patch (1.5 mg total) onto the skin every 3 (three) days. (Patient not taking: Reported on 05/27/2021) 10 patch 12   No current facility-administered medications for this encounter.   Facility-Administered Medications Ordered in Other Encounters  Medication Dose Route Frequency Provider Last Rate Last Admin   heparin lock flush 100 unit/mL  500 Units Intracatheter Once PRN Gerald [name], Praveena, MD       sodium chloride flush (NS) 0.9 % injection 10 mL  10 mL Intracatheter Once PRN Gerald Pike, MD  Physical Findings: The patient is in no acute distress. Patient is alert and oriented. Wt Readings from Last 3 Encounters:  05/27/21 214 lb 12.8 oz (97.4 kg)  03/31/21 218 lb 4.8 oz (99 kg)  03/23/21 216 lb 6.4 oz (98.2 kg)    height is 5\' 9"  (1.753 m) and weight is 214 lb 12.8 oz (97.4 kg). His temperature is 97.6 F (36.4 C). His blood pressure is 103/87 and his pulse  is 62. His respiration is 20 and oxygen saturation is 100%. .  General: Alert and oriented, in no acute distress HEENT: Head is normocephalic. Extraocular movements are intact. Oropharynx is notable for no visible tumor.  There is healing mucosa in the left tonsillar region , there is no thrush.  Tongue is symmetric and midline Neck: Neck is notable for no palpable masses.  Moderate anterior lymphedema Skin: Skin in treatment fields shows satisfactory healing in the neck Heart: Regular in rate and rhythm with no murmurs, rubs, or gallops. Chest: Clear to auscultation bilaterally, with no rhonchi, wheezes, or rales. Psychiatric: Judgment and insight are intact. Affect is appropriate.   Lab Findings: Lab Results  Component Value Date   WBC 3.7 (L) 05/11/2021   HGB 9.9 (L) 05/11/2021   HCT 27.6 (L) 05/11/2021   MCV 97.9 05/11/2021   PLT 150 05/11/2021    Lab Results  Component Value Date   TSH 1.924 12/22/2020    Radiographic Findings: NM PET Image Restag (PS) Skull Base To Thigh  Addendum Date: 05/26/2021   ADDENDUM REPORT: 05/26/2021 12:08 ADDENDUM: Nonspecific heterogeneous on moderate FDG uptake in the prostate with focal areas near the base. Findings are nonspecific. Consider urologic consultation and PSA correlation. These results will be called to the ordering clinician or representative by the Radiologist Assistant, and communication documented in the PACS or [employer]. Electronically Signed   By: Gerald [name] M.D.   On: 05/26/2021 12:08   Result Date: 05/26/2021 CLINICAL DATA:  Subsequent treatment strategy for head neck cancer in a 50 year old male. EXAM: NUCLEAR MEDICINE PET SKULL BASE TO THIGH TECHNIQUE: 10.37 mCi F-18 FDG was injected intravenously. Full-ring PET imaging was performed from the skull base to thigh after the radiotracer. CT data was obtained and used for attenuation correction and anatomic localization. Fasting blood glucose: 99 mg/dl COMPARISON:   12/03/2020 PET-CT FINDINGS: Mediastinal blood pool activity: SUV max 2.57 Liver activity: SUV max NA NECK: Markedly diminished FDG uptake about the LEFT tonsillar fossa and LEFT-sided level II lymph node. (Image 40/4) LEFT II lymph node measuring 7 mm with a maximum SUV of 2.35, previously 17 mm with a maximum SUV of 13.9. Only minimal asymmetric fullness of LEFT tonsillar tissue and base of tongue as compared to the previous exam. Maximum SUV in this location 4.0 as compared to 16.6 (image 36/4) no new hypermetabolic lymph nodes in the neck. Incidental CT findings: none CHEST: No hypermetabolic mediastinal or hilar nodes. No suspicious pulmonary nodules on the CT scan. Incidental CT findings: Lungs are clear. Airways are patent. No effusion. RIGHT-sided Port-A-Cath terminates at the caval to atrial junction. Aortic caliber is normal. Heart size top normal without associated effusion. Normal caliber of central pulmonary vessels. Limited assessment of cardiovascular structures given lack of intravenous contrast. ABDOMEN/PELVIS: No abnormal hypermetabolic activity within the liver, pancreas, adrenal glands, or spleen. No hypermetabolic lymph nodes in the abdomen or pelvis. Heterogeneous uptake throughout the prostate with focal uptake bilaterally near the prostate base with a maximum SUV of approximately 5.4 (image  197/4 this is in the LEFT gland, larger area in the RIGHT gland. Findings are nonspecific. Incidental CT findings: none SKELETON: No focal hypermetabolic activity to suggest skeletal metastasis. Incidental CT findings: Lower lumbar spinal fusion as before. Spinal degenerative changes. IMPRESSION: Marked interval response to therapy with resolution of activity on the LEFT associated with tonsillar tissue and marked interval decrease in FDG activity associated with the LEFT neck lymph node along with decreased size. Maximum SUV is less than cardiac blood pool on today's study. No signs metastatic disease or  new neck adenopathy. Electronically Signed: By: Gerald [name] M.D. On: 05/26/2021 08:33    Impression/Plan:    1) Head and Neck Cancer Status: No evidence of disease.  I personally reviewed his imaging.  2) Nutritional Status: His PEG tube has been removed.  His weight has stabilized.    3)  Swallowing: Continue swallowing exercises, good function  4) Dental: It appears he has not followed up with Dr. Benson Norway.  I have asked Harlow Asa to arrange an appointment.  Encouraged to continue regular followup with dentistry, and dental hygiene including fluoride rinses.   5) lymphedema: continue PT for lymphedema of neck  6) Thyroid function: Check annually with medical oncology Lab Results  Component Value Date   TSH 1.924 12/22/2020    7)  Reports a warm/tingling sensation that occurs when he's sitting and looks downward (feels from lower back down backs of knees) : This is consistent with L'hermitte's sign, and occasional subacute side effect of radiation therapy to the head and neck region.  Patient reassured that this almost always resolves with time.  He will let us know if it worsens.  8)  Follow-up: he will see Wilber Bihari NP in about two weeks for repeat labs and assessment. He will see me in 6 months and Anderson Malta, our navigator, will refer him to see [facility] at his request for future ENT follow ups. He knows to call our team if there is anything he needs.   On date of service, in total, I spent 30 minutes on this encounter. Patient was seen in person. _____________________________________   Gerald Gibson, MD

## 2021-05-31 ENCOUNTER — Telehealth (HOSPITAL_COMMUNITY): Payer: Self-pay

## 2021-06-06 ENCOUNTER — Ambulatory Visit: Payer: Managed Care, Other (non HMO)

## 2021-06-08 ENCOUNTER — Inpatient Hospital Stay: Payer: Managed Care, Other (non HMO) | Attending: Hematology and Oncology | Admitting: Adult Health

## 2021-06-08 ENCOUNTER — Inpatient Hospital Stay: Payer: Managed Care, Other (non HMO)

## 2021-06-08 ENCOUNTER — Other Ambulatory Visit: Payer: Self-pay

## 2021-06-08 ENCOUNTER — Ambulatory Visit: Payer: Managed Care, Other (non HMO)

## 2021-06-08 VITALS — BP 115/76 | HR 72 | Temp 97.9°F | Resp 18 | Ht 69.0 in | Wt 217.8 lb

## 2021-06-08 DIAGNOSIS — Z923 Personal history of irradiation: Secondary | ICD-10-CM | POA: Diagnosis not present

## 2021-06-08 DIAGNOSIS — C109 Malignant neoplasm of oropharynx, unspecified: Secondary | ICD-10-CM

## 2021-06-08 DIAGNOSIS — C760 Malignant neoplasm of head, face and neck: Secondary | ICD-10-CM

## 2021-06-08 DIAGNOSIS — D539 Nutritional anemia, unspecified: Secondary | ICD-10-CM | POA: Diagnosis not present

## 2021-06-08 DIAGNOSIS — D649 Anemia, unspecified: Secondary | ICD-10-CM | POA: Insufficient documentation

## 2021-06-08 DIAGNOSIS — Z9221 Personal history of antineoplastic chemotherapy: Secondary | ICD-10-CM | POA: Insufficient documentation

## 2021-06-08 DIAGNOSIS — R1310 Dysphagia, unspecified: Secondary | ICD-10-CM | POA: Diagnosis present

## 2021-06-08 DIAGNOSIS — C090 Malignant neoplasm of tonsillar fossa: Secondary | ICD-10-CM | POA: Diagnosis not present

## 2021-06-08 DIAGNOSIS — Z79899 Other long term (current) drug therapy: Secondary | ICD-10-CM | POA: Insufficient documentation

## 2021-06-08 LAB — CBC WITH DIFFERENTIAL/PLATELET
Abs Immature Granulocytes: 0 10*3/uL (ref 0.00–0.07)
Basophils Absolute: 0 10*3/uL (ref 0.0–0.1)
Basophils Relative: 1 %
Eosinophils Absolute: 0.2 10*3/uL (ref 0.0–0.5)
Eosinophils Relative: 4 %
HCT: 30.8 % — ABNORMAL LOW (ref 39.0–52.0)
Hemoglobin: 10.7 g/dL — ABNORMAL LOW (ref 13.0–17.0)
Immature Granulocytes: 0 %
Lymphocytes Relative: 20 %
Lymphs Abs: 0.8 10*3/uL (ref 0.7–4.0)
MCH: 35.7 pg — ABNORMAL HIGH (ref 26.0–34.0)
MCHC: 34.7 g/dL (ref 30.0–36.0)
MCV: 102.7 fL — ABNORMAL HIGH (ref 80.0–100.0)
Monocytes Absolute: 0.2 10*3/uL (ref 0.1–1.0)
Monocytes Relative: 5 %
Neutro Abs: 2.8 10*3/uL (ref 1.7–7.7)
Neutrophils Relative %: 70 %
Platelets: 139 10*3/uL — ABNORMAL LOW (ref 150–400)
RBC: 3 MIL/uL — ABNORMAL LOW (ref 4.22–5.81)
RDW: 13 % (ref 11.5–15.5)
WBC: 3.9 10*3/uL — ABNORMAL LOW (ref 4.0–10.5)
nRBC: 0 % (ref 0.0–0.2)

## 2021-06-08 LAB — BILIRUBIN, TOTAL AND DIRECT (CANCER CENTER ONLY)
Bilirubin, Direct: <0.1 mg/dL (ref 0.0–0.2)
Total Bilirubin: 1 mg/dL (ref 0.3–1.2)

## 2021-06-08 LAB — RETIC PANEL
Immature Retic Fract: 18.1 % — ABNORMAL HIGH (ref 2.3–15.9)
RBC.: 3.03 MIL/uL — ABNORMAL LOW (ref 4.22–5.81)
Retic Count, Absolute: 99.1 10*3/uL (ref 19.0–186.0)
Retic Ct Pct: 3.3 % — ABNORMAL HIGH (ref 0.4–3.1)
Reticulocyte Hemoglobin: 37.5 pg (ref >27.9)

## 2021-06-08 LAB — COMPREHENSIVE METABOLIC PANEL
ALT: 14 U/L (ref 0–44)
AST: 17 U/L (ref 15–41)
Albumin: 4 g/dL (ref 3.5–5.0)
Alkaline Phosphatase: 56 U/L (ref 38–126)
Anion gap: 8 (ref 5–15)
BUN: 9 mg/dL (ref 6–20)
CO2: 24 mmol/L (ref 22–32)
Calcium: 8.6 mg/dL — ABNORMAL LOW (ref 8.9–10.3)
Chloride: 109 mmol/L (ref 98–111)
Creatinine, Ser: 1.08 mg/dL (ref 0.61–1.24)
GFR, Estimated: >60 mL/min (ref >60)
Glucose, Bld: 102 mg/dL — ABNORMAL HIGH (ref 70–99)
Potassium: 3.9 mmol/L (ref 3.5–5.1)
Sodium: 141 mmol/L (ref 135–145)
Total Bilirubin: 0.8 mg/dL (ref 0.3–1.2)
Total Protein: 6.1 g/dL — ABNORMAL LOW (ref 6.5–8.1)

## 2021-06-08 LAB — TSH: TSH: 2.461 u[IU]/mL (ref 0.320–4.118)

## 2021-06-08 MED ORDER — HEPARIN SOD (PORK) LOCK FLUSH 100 UNIT/ML IV SOLN
500.0000 [IU] | Freq: Once | INTRAVENOUS | Status: AC | PRN
  Administered 2021-06-08: 500 [IU]

## 2021-06-08 MED ORDER — SODIUM CHLORIDE 0.9% FLUSH
10.0000 mL | Freq: Once | INTRAVENOUS | Status: AC | PRN
  Administered 2021-06-08: 10 mL

## 2021-06-08 NOTE — Assessment & Plan Note (Signed)
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  He has undergone tonsillectomy followed by adjuvant radiation with concurrent cisplatin that completed July 2022.  1.  Squamous cell of the tonsillar fossa.  His most recent PET scan was completed a couple weeks ago and reviewed with the patient by Dr. Camelia Eng who noted no evidence of recurrence.  He has no clinical signs of recurrence today.  2.  Neck lymphedema this is improving.  I recommended that he continue to follow with the PT recommendations and use his lymphedema sleeve and flex E touch as recommended as well as doing the massage.  3.  Dysphagia.  He is swallowing well.  He continues to follow-up with speech-language pathology, and had an appointment today.  He is eating and drinking well and his weight is stable.  4.  Macrocytic anemia.  His hemoglobin has improved today to 10.7, his MCV is around 102.  We discussed his labs in detail today and I reviewed his previous work-up which has shown a normal B12, normal iron studies, normal copper, normal folate.  He is worried that something else could be wrong.  I gave [place] reassurance.  Due to his fatigue we will get a TSH.  Since we are adding labs on he really wants to see what his direct and indirect bilirubin is which I can accommodate.  I have also added on a reticulocyte panel.  We talked at length at that treatment of anemia is based on the etiology and that there are many different causes.  Right now we need to give his body continue time to heal and recover from the treatments and side effects of treatments that he has undergone.  Ehtan will return in January, 2023.  He is not can remove his port in the meantime.  We will repeat labs and he will see Dr. Chryl Heck.

## 2021-06-08 NOTE — Progress Notes (Signed)
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 He underwent a CT neck which showed primarily a submucosal 3 cm mass within the left tonsillar fossa also noted to have 3.2 cm left jugulodigastric lymph node, findings were concerning for left tonsillar malignancy and malignant lymph node. He underwent left tonsillectomy by Dr. [name] and pathology showed invasive squamous cell carcinoma, HPV mediated.  He He had PET imaging which showed evidence of left tonsillar cancer and left ipsilateral lymph nodes, from my review, no evidence of distant metastasis, I could not see an official report on the PET imaging. He is here for initial recommendations.   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 [name] and pathology showed invasive squamous cell carcinoma, HPV mediated.   12/03/2020 PET scan   Hypermetabolic lesion in the left tonsillar fossa corresponding to the abnormal findings on the neck CT.  Lesion was confined to the mucosal/submucosal tissue.  Hypermetabolic ipsilateral metastatic level 2 lymph node   12/06/2020 Initial Diagnosis   He underwent a CT neck due to chronic sore throat x 3 months which showed primarily a submucosal 3 cm mass within the left  tonsillar fossa also noted to have 3.2 cm left jugulodigastric lymph node, findings were concerning for left tonsillar malignancy and malignant lymph node.      12/06/2020 Cancer Staging   Staging form: Pharynx - HPV-Mediated Oropharynx, AJCC 8th Edition - Clinical stage from 12/06/2020: Stage I (cT2, cN1, cM0, p16+) - Signed by Benay Pike, MD on 12/06/2020 Stage prefix: Initial diagnosis    12/27/2020 - 01/26/2021 Chemotherapy   Cisplatin every 7 days x 5 cycles   12/28/2020 - 02/11/2021 Radiation Therapy   Left tonsil and bilateral neck / received 62 Gy in 31 fractions (treatment stopped prematurely, 70Gy had been planned)   05/26/2021 PET scan   PET scan completed and reviewed by Dr. Camelia Eng with patient showing no evidence of disease.   Cancer of tonsillar fossa ([place])  11/22/2020 Surgery   He underwent left tonsillectomy by Dr. [name] and pathology showed invasive squamous cell carcinoma, HPV mediated.   12/03/2020 PET scan   Hypermetabolic lesion in the left tonsillar fossa corresponding to the abnormal findings on the neck CT.  Lesion was confined to the mucosal/submucosal tissue.  Hypermetabolic ipsilateral metastatic level 2 lymph node   12/07/2020 Initial Diagnosis   Cancer of tonsillar fossa ([place])   12/07/2020 Cancer Staging   Staging form: Pharynx - HPV-Mediated Oropharynx, AJCC 8th Edition - Clinical stage from 12/07/2020: Stage I (cT2, cN1, cM0, p16+) - Signed by Eppie Gibson, MD on 12/07/2020 Stage prefix: Initial diagnosis    12/27/2020 - 01/26/2021 Chemotherapy   Cisplatin every 7 days x 5 cycles   12/28/2020 - 02/11/2021 Radiation Therapy   Left tonsil and bilateral neck /  received 62 Gy in 31 fractions (treatment stopped prematurely, 70Gy had been planned)   05/26/2021 PET scan   PET scan completed and reviewed by Dr. Camelia Eng with patient showing no evidence of disease.     CURRENT THERAPY: observation  INTERVAL HISTORY: Gerald [name] 50 y.o. male returns for f/u of his  tonsillar cancer.  He is recovering from his treatment.  He notes his energy is returning and he is walking with his dog.  He is back at work and has some mild fatigue.    He struggles with neck lymphedema.  He is wearing the appropriate garments.  He is also concerned about his anemia.  He had a work up in September including P61, copper, folic acid, and iron.  Iron panel was repeated in October and did not demonstrate any deficiency at that time as well.  His pre treatment weight was around 250 pounds.  His weight is 217 today which is up from 213 in August of 2022.    Gerald [name] is working with SLP for his swallowing.  He is eating twice a day and is not noticing any issues with his weight.  He obviously is avoiding breads.  Gerald [name] still has his port and he would like to keep this until at least January when he follows up with Dr. Chryl Heck.   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  MEDICAL HISTORY: Past Medical History:  Diagnosis Date   Cancer [facility])    Sleep apnea    Tonsillar mass 11/16/2020    SURGICAL HISTORY: Past Surgical History:  Procedure Laterality Date   DIRECT LARYNGOSCOPY N/A 11/22/2020   Procedure: DIRECT LARYNGOSCOPY WITH BIOPSY;  Surgeon: Leta Baptist, MD;  Location: [facility];  Service: ENT;  Laterality: N/A;   FOOT SURGERY  2000    extra nivicular removal   IR GASTROSTOMY TUBE MOD SED  12/24/2020   IR GASTROSTOMY TUBE REMOVAL  04/11/2021   IR IMAGING GUIDED PORT INSERTION  12/24/2020   SPINE SURGERY     TONSILLECTOMY Left 11/22/2020   Procedure: LEFT TONSILLECTOMY;  Surgeon: Leta Baptist, MD;  Location: [facility];  Service: ENT;  Laterality: Left;    SOCIAL HISTORY: Social History   Socioeconomic History   Marital status: Married    Spouse name: Not on file   Number of children: Not on file   Years of education: Not on file   Highest education level: Not on file  Occupational History   Not on file  Tobacco Use   Smoking status: Never   Smokeless tobacco: Never  Vaping Use   Vaping Use: Never used  Substance and Sexual Activity   Alcohol use: Not Currently    Comment: rare   Drug use: Never   Sexual activity: Not Currently  Other Topics Concern   Not on file  Social History Narrative   Not on file   Social  Determinants of Health   Financial Resource Strain: Low Risk    Difficulty of Paying Living Expenses: Not hard at all  Food Insecurity: No Food Insecurity   Worried About [place] in the Last Year: Never true   Ran Out of Food in the Last Year: Never true  Transportation Needs: No Transportation Needs   Lack of Transportation (Medical): No   Lack of Transportation (Non-Medical): No  Physical Activity: Not on file  Stress: No Stress Concern Present   Feeling of Stress : Only a little  Social Connections: Engineer, building services of Communication with Friends and Family: More than three times a week   Frequency of Social Gatherings with Friends and Family: Three times a week   Attends Religious Services: 1 to 4 times per year   Active Member of Clubs or Organizations: Yes   Attends Archivist Meetings: 1 to 4 times per year   Marital Status: Married  Human resources officer Violence: Not At Risk   Fear of Current or Ex-Partner: No   Emotionally Abused: No    Physically Abused: No   Sexually Abused: No    FAMILY HISTORY: Non contributory  Review of Systems  Constitutional:  Positive for fatigue. Negative for appetite change, chills, fever and unexpected weight change.  HENT:   Negative for hearing loss, lump/mass and trouble swallowing.   Eyes:  Negative for eye problems and icterus.  Respiratory:  Negative for chest tightness, cough and shortness of breath.   Cardiovascular:  Negative for chest pain, leg swelling and palpitations.  Gastrointestinal:  Negative for abdominal distention, abdominal pain, constipation, diarrhea, nausea and vomiting.  Endocrine: Negative for hot flashes.  Genitourinary:  Negative for difficulty urinating.   Musculoskeletal:  Negative for arthralgias.  Skin:  Negative for itching and rash.  Neurological:  Negative for dizziness, extremity weakness, headaches and numbness.  Hematological:  Negative for adenopathy. Does not bruise/bleed easily.  Psychiatric/Behavioral:  Negative for depression. The patient is not nervous/anxious.      PHYSICAL EXAMINATION  ECOG PERFORMANCE STATUS: 1 - Symptomatic but completely ambulatory  Vitals:   06/08/21 1104  BP: 115/76  Pulse: 72  Resp: 18  Temp: 97.9 F (36.6 C)  SpO2: 100%    Physical Exam Constitutional:      General: He is not in acute distress.    Appearance: Normal appearance. He is not toxic-appearing.  HENT:     Head: Normocephalic and atraumatic.  Eyes:     General: No scleral icterus. Cardiovascular:     Rate and Rhythm: Normal rate and regular rhythm.     Pulses: Normal pulses.     Heart sounds: Normal heart sounds.  Pulmonary:     Effort: Pulmonary effort is normal.     Breath sounds: Normal breath sounds.  Abdominal:     General: Abdomen is flat. Bowel sounds are normal. There is no distension.     Palpations: Abdomen is soft.     Tenderness: There is no abdominal tenderness.  Musculoskeletal:        General: No swelling.     Cervical  back: Neck supple.  Lymphadenopathy:     Cervical: No cervical adenopathy.  Skin:    General: Skin is warm and dry.     Findings: No rash.  Neurological:     General: No focal deficit present.     Mental Status: He is alert.  Psychiatric:        Mood and Affect:  Mood normal.        Behavior: Behavior normal.    LABORATORY DATA:  CBC    Component Value Date/Time   WBC 3.9 (L) 06/08/2021 1100   RBC 3.00 (L) 06/08/2021 1100   HGB 10.7 (L) 06/08/2021 1100   HGB 10.6 (L) 03/22/2021 0815   HCT 30.8 (L) 06/08/2021 1100   HCT 33.9 (L) 04/05/2021 1031   PLT 139 (L) 06/08/2021 1100   PLT 146 (L) 03/22/2021 0815   MCV 102.7 (H) 06/08/2021 1100   MCH 35.7 (H) 06/08/2021 1100   MCHC 34.7 06/08/2021 1100   RDW 13.0 06/08/2021 1100   LYMPHSABS 0.8 06/08/2021 1100   MONOABS 0.2 06/08/2021 1100   EOSABS 0.2 06/08/2021 1100   BASOSABS 0.0 06/08/2021 1100    CMP     Component Value Date/Time   NA 141 06/08/2021 1100   K 3.9 06/08/2021 1100   CL 109 06/08/2021 1100   CO2 24 06/08/2021 1100   GLUCOSE 102 (H) 06/08/2021 1100   BUN 9 06/08/2021 1100   CREATININE 1.08 06/08/2021 1100   CREATININE 0.88 03/22/2021 0815   CALCIUM 8.6 (L) 06/08/2021 1100   PROT 6.1 (L) 06/08/2021 1100   ALBUMIN 4.0 06/08/2021 1100   AST 17 06/08/2021 1100   ALT 14 06/08/2021 1100   ALKPHOS 56 06/08/2021 1100   BILITOT 0.8 06/08/2021 1100   GFRNONAA >60 06/08/2021 1100   GFRNONAA >60 03/22/2021 0815       ASSESSMENT and THERAPY PLAN:   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HCC) Gerald [name] is a 50 year old [place] man with 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  He has undergone tonsillectomy followed by adjuvant radiation with concurrent cisplatin that completed July 2022.  1.  Squamous cell of the tonsillar fossa.  His most recent PET scan was completed a couple weeks ago and reviewed with the patient by Dr. Camelia Eng who noted no evidence of recurrence.  He has  no clinical signs of recurrence today.  2.  Neck lymphedema this is improving.  I recommended that he continue to follow with the PT recommendations and use his lymphedema sleeve and flex E touch as recommended as well as doing the massage.  3.  Dysphagia.  He is swallowing well.  He continues to follow-up with speech-language pathology, and had an appointment today.  He is eating and drinking well and his weight is stable.  4.  Macrocytic anemia.  His hemoglobin has improved today to 10.7, his MCV is around 102.  We discussed his labs in detail today and I reviewed his previous work-up which has shown a normal B12, normal iron studies, normal copper, normal folate.  He is worried that something else could be wrong.  I gave Gerald [name] reassurance.  Due to his fatigue we will get a TSH.  Since we are adding labs on he really wants to see what his direct and indirect bilirubin is which I can accommodate.  I have also added on a reticulocyte panel.  We talked at length at that treatment of anemia is based on the etiology and that there are many different causes.  Right now we need to give his body continue time to heal and recover from the treatments and side effects of treatments that he has undergone.  Gerald [name] will return in January, 2023.  He is not can remove his port in the meantime.  We will repeat labs and he will see Dr. Chryl Heck.  All questions were answered. The patient knows to call the clinic with any problems, questions or concerns. We can certainly see the patient much sooner if necessary.  Total encounter time: 40 minutes in chart review, lab review, imaging review, pathology review, order entry, face-to-face visit time, care coordination, and documentation of the encounter.  Wilber Bihari, NP 06/08/21 11:47 AM Medical Oncology and Hematology [facility] [place], [place] [address] Tel. [phone]    Fax. [phone]  *Total Encounter Time as defined by  the Centers for Medicare and Medicaid Services includes, in addition to the face-to-face time of a patient visit (documented in the note above) non-face-to-face time: obtaining and reviewing outside history, ordering and reviewing medications, tests or procedures, care coordination (communications with other health care professionals or caregivers) and documentation in the medical record.

## 2021-06-08 NOTE — Therapy (Signed)
[facility] [address] [place], [place], [address] Phone: [phone]   Fax:  [phone]  Speech Language Pathology Treatment  Patient Details  Name: Gerald [name] MRN: [id number] Date of Birth: [birth year] Referring Provider (SLP): Eppie Gibson, MD   Encounter Date: 06/08/2021   End of Session - 06/08/21 1250     Visit Number 5    Number of Visits 7    Date for SLP Re-Evaluation 07/06/21    SLP Start Time 0848    SLP Stop Time  0930    SLP Time Calculation (min) 42 min    Activity Tolerance Patient tolerated treatment well             Past Medical History:  Diagnosis Date   Cancer [facility])    Sleep apnea    Tonsillar mass 11/16/2020    Past Surgical History:  Procedure Laterality Date   DIRECT LARYNGOSCOPY N/A 11/22/2020   Procedure: DIRECT LARYNGOSCOPY WITH BIOPSY;  Surgeon: Leta Baptist, MD;  Location: [facility];  Service: ENT;  Laterality: N/A;   FOOT SURGERY  2000   extra nivicular removal   IR GASTROSTOMY TUBE MOD SED  12/24/2020   IR GASTROSTOMY TUBE REMOVAL  04/11/2021   IR IMAGING GUIDED PORT INSERTION  12/24/2020   SPINE SURGERY     TONSILLECTOMY Left 11/22/2020   Procedure: LEFT TONSILLECTOMY;  Surgeon: Leta Baptist, MD;  Location: [facility];  Service: ENT;  Laterality: Left;    There were no vitals filed for this visit.   Subjective Assessment - 06/08/21 0851     Subjective "I think I put too much in my mouth and tried to swallow it and it wouldn't go down. It was a sandwich."    Currently in Pain? Yes    Pain Location Neck    Pain Descriptors / Indicators Constant;Discomfort                   ADULT SLP TREATMENT - 06/08/21 0910       General Information   Behavior/Cognition Alert;Cooperative;Pleasant mood      Treatment Provided   Treatment provided Dysphagia      Dysphagia Treatment   Temperature Spikes Noted No    Respiratory Status Room air    Oral Cavity  - Dentition Adequate natural dentition    Patient observed directly with PO's Yes    Type of PO's observed Dysphagia 3 (soft);Thin liquids    Liquids provided via Cup    Oral Phase Signs & Symptoms Other (comment)   none noted today   Pharyngeal Phase Signs & Symptoms Other (comment)   none noted today   Other treatment/comments Pt has done HEP regularly but not full scope of exercises since last week of September. Prior to that SLP learned pt had not really completed HEP whatsoever. Pt completed HEP with SBA today. SLP reiterated how important it is to have HEP done as directed. Told pt to complete HEP until May and pt said he will complete HEP until [birth year] (his [birth year]) and then reduce to x3/week. Re-educated pt on how to make exercises more difficult (incr reps and/or hold times) and pt demonstrated understanding.      Assessment / Recommendations / Plan   Plan Continue with current plan of care   (every other  month)     Dysphagia Recommendations   Diet recommendations Dysphagia 3 (mechanical soft);Thin liquid;Regular    Liquids provided via Cup  Medication Administration --   as tolerated     Progression Toward Goals   Progression toward goals Progressing toward goals              SLP Education - 06/08/21 1249     Education Details rationale for HEP as directed, suggested possible return for lymphedema tx, making HEP more challenging    Person(s) Educated Patient    Methods Explanation    Comprehension Verbalized understanding              SLP Short Term Goals - 04/07/21 1006       SLP SHORT TERM GOAL #1   Title pt will tell SLP why pt is completing HEP with modified independence    Period --   visits, for all STGs   Status Achieved      SLP SHORT TERM GOAL #2   Title pt will complete HEP with rare min A in 2 sessions    Baseline 03-10-21    Status Achieved      SLP SHORT TERM GOAL #3   Title pt will describe 3 overt s/s aspiration PNA with modified  independence    Status Achieved      SLP SHORT TERM GOAL #4   Title pt will tell SLP how a food journal could hasten return to a more normalized diet    Status Deferred              SLP Long Term Goals - 06/08/21 1252       SLP LONG TERM GOAL #1   Title pt will complete HEP with modified independence over three visits    Baseline 04-07-21    Time 2    Period --   visits, for all LTGs   Status On-going      SLP LONG TERM GOAL #2   Title pt will complete HEP with independence over two visits    Status Deferred   duplicate of LTG #1     SLP LONG TERM GOAL #3   Title pt will describe how to modify HEP over time, and the timeline associated with reduction in HEP frequency with modified independence over two sessions    Status Achieved              Plan - 06/08/21 1251     Clinical Impression Statement Pt cont to appear safe with dysphagia III diet and thin liquids. Pt has had some regular diet items as well. SLP reveiewed pt's individualized HEP for dysphagia however pt did not complete HEP until last week in September. There are no overt s/s aspiration PNA observed, and none reported by pt at this time. Data indicate that pt's swallow ability will likely decrease over the course of radiation therapy and could very well decline over time following conclusion of their radiation therapy due to muscle disuse atrophy and/or muscle fibrosis. Pt will cont to need to be seen by SLP, but could do so every other month at this time, in order to assess safety of PO intake, assess the need for recommending any objective swallow assessment, and ensuring pt correctly completes the individualized HEP.    Speech Therapy Frequency --   once approx every 4 weeks   Duration --   90 days/12 weeks   Treatment/Interventions Aspiration precaution training;Pharyngeal strengthening exercises;Diet toleration management by SLP;Trials of upgraded texture/liquids;Internal/external aids;Patient/family  education;SLP instruction and feedback;Compensatory strategies    Potential to Achieve Goals Good    SLP Home Exercise Plan  provided today    Consulted and Agree with Plan of Care Patient             Patient will benefit from skilled therapeutic intervention in order to improve the following deficits and impairments:   Dysphagia, unspecified type    Problem List Patient Active Problem List   Diagnosis Date Noted   Mucositis due to antineoplastic therapy 03/31/2021   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 03/31/2021   Dehydration 01/29/2021   Pancytopenia ([place]) 01/29/2021   GERD (gastroesophageal reflux disease) 01/29/2021   Weight loss, unintentional 01/04/2021   Tinnitus 01/04/2021   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 [place], [place], [address] Phone: [phone]   Fax:  [phone]   Name: Gerald [name] MRN: [id number] Date of Birth: Dec 18, 1970

## 2021-06-14 NOTE — Progress Notes (Signed)
Oncology Nurse Navigator Documentation   Per patient's 05/27/21 post-treatment follow-up with Dr. Isidore Moos, sent fax to [facility] ENT Scheduling with request Gerald [name] be contacted and scheduled for routine post-RT follow-up with Dr. Fredric Dine or Dr. Constance Holster in February 2023.  He's previously seen Dr. [name] but requests to be seen by [facility] ENT for all future follow ups. Notification of successful fax transmission received.   Gerald Asa RN, BSN, OCN Head & Neck Oncology Nurse Gerald [name] at [facility] Phone # [phone]  Fax # [phone]

## 2021-06-24 ENCOUNTER — Other Ambulatory Visit: Payer: Self-pay

## 2021-06-24 ENCOUNTER — Encounter (HOSPITAL_COMMUNITY): Payer: Self-pay | Admitting: Dentistry

## 2021-06-24 ENCOUNTER — Ambulatory Visit (INDEPENDENT_AMBULATORY_CARE_PROVIDER_SITE_OTHER): Payer: Managed Care, Other (non HMO) | Admitting: Dentistry

## 2021-06-24 VITALS — BP 126/81 | HR 59 | Temp 98.4°F | Wt 213.0 lb

## 2021-06-24 DIAGNOSIS — Y842 Radiological procedure and radiotherapy as the cause of abnormal reaction of the patient, or of later complication, without mention of misadventure at the time of the procedure: Secondary | ICD-10-CM

## 2021-06-24 DIAGNOSIS — K117 Disturbances of salivary secretion: Secondary | ICD-10-CM

## 2021-06-24 DIAGNOSIS — R634 Abnormal weight loss: Secondary | ICD-10-CM

## 2021-06-24 DIAGNOSIS — Z923 Personal history of irradiation: Secondary | ICD-10-CM

## 2021-06-24 MED ORDER — SODIUM FLUORIDE 1.1 % DT GEL: 1.0000 "application " | Freq: Every day | DENTAL | 11 refills | Status: DC

## 2021-06-24 NOTE — Progress Notes (Signed)
Department of Dental Medicine                LIMITED EXAM   Service Date:   06/24/2021  Patient Name:   Gerald [name] Date of Birth:   1970/12/26 Medical Record Number: [id number]   PLAN/RECOMMENDATIONS   Assessment Xerostomia, weight loss  Recommendations Brush after meals and at bedtime. Use fluoride at bedtime. Use trismus exercises as directed. Use CLoSYS and warm salt water/baking soda rinses as needed. Take multiple sips of water as needed.  Plan Follow-up as needed.  Rx:  Sodium fluoride 1.1% gel Return to primary dentist  for routine dental care including replacement of missing teeth as needed, cleanings/periodontal therapy and exams. Call if any questions or concerns arise.   06/24/2021 Progress Note:  COVID-19 SCREENING:  The patient denies symptoms concerning for COVID-19 infection including fever, chills, cough, or newly developed shortness of breath.   HISTORY OF PRESENT ILLNESS: Gerald [name] is a 50 y.o. male who presents today for an oral examination after chemoradiation therapy for oropharyngeal cancer and for delivery of upper and lower fluoride trays.  The patient has completed 31 of 35 radiation treatments from 12-28-20 to 02-11-21.  They have completed 5 chemotherapy treatments.   REVIEW OF CHIEF COMPLAINTS: Dry mouth: Yes Hard to swallow: No Hurts to swallow:  No Taste changes:  No, his taste has completely returned although some things he used to like he does not anymore. 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 03/31/2021   Dehydration 01/29/2021   Pancytopenia ([place]) 01/29/2021   GERD (gastroesophageal reflux disease) 01/29/2021   Weight loss, unintentional 01/04/2021   Tinnitus 01/04/2021   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[place]) 12/14/2020   Cancer of tonsillar fossa ([place]) 12/07/2020   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   Neoplasm related pain 12/06/2020   Impotence due to erectile dysfunction 08/30/2012   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   Past Medical History:  Diagnosis Date   Cancer [facility])    Sleep apnea    Tonsillar mass 11/16/2020   Past Surgical History:  Procedure Laterality Date   DIRECT LARYNGOSCOPY N/A 11/22/2020   Procedure: DIRECT LARYNGOSCOPY WITH BIOPSY;  Surgeon: Leta Baptist, MD;  Location: [place];  Service: ENT;  Laterality: N/A;   FOOT SURGERY  2000   extra nivicular removal   IR GASTROSTOMY TUBE MOD SED  12/24/2020   IR GASTROSTOMY TUBE REMOVAL  04/11/2021   IR IMAGING GUIDED PORT INSERTION  12/24/2020   SPINE SURGERY     TONSILLECTOMY Left 11/22/2020   Procedure: LEFT TONSILLECTOMY;  Surgeon: Leta Baptist, MD;  Location: [place];  Service: ENT;  Laterality: Left;   No current outpatient medications on file.   No current facility-administered medications for this visit.   No Known Allergies   VITALS: BP 126/81 (BP Location: Right Arm, Patient Position: Sitting, Cuff Size:  Normal)   Pulse (!) 59   Temp 98.4 F (36.9 C) (Oral)   Wt 213 lb (96.6 kg)   BMI 31.45 kg/m    DENTAL EXAM: Oral hygiene (plaque):  No Mucositis:  No Description of saliva:  Slightly thick, viscous saliva Exposed bone:  No Other watched areas:  #17 occlusal caries- no changes, needs restorative   ASSESSMENT/DIAGNOSES: Patient with history of radiation therapy to the head and neck region.  Xerostomia:   He reports that his dry mouth is not terrible, but he does notice it occasionally.  He says that he has more of an issue with phlegm build-up which he is managing with sips of water as needed.  Recommend trying CLoSYS OTC brand for dry mouth at night to see if this improves his symptoms.  Coupons given to patient today.  Weight Loss:  He is able to maintain adequate nutrition now.   PROCEDURES: Delivery of upper and lower fluoride trays. Appliances were tried in and adjusted as needed.  Polished. Postoperative instructions were provided in a written and verbal format concerning the use and care of appliances.   PLAN AND RECOMMENDATIONS: Brush after meals and at bedtime.  Use fluoride at bedtime. Use trismus exercises as directed. Use CLoSYS and salt water/baking soda rinses as needed for dry mouth. Take multiple sips of water as needed.  Return to regular dentist for routine dental care including cleanings, periodic exams and replacement of missing teeth as needed. Follow-up as needed in the hospital clinic.   Rx:  Sodium fluoride 1.1% gel to use with fluoride trays Call if any problems or concerns arise.  All questions and concerns were invited and addressed.  The patient tolerated today's visit well and departed in stable condition.     Milford Benson [place], D.M.D.

## 2021-06-24 NOTE — Patient Instructions (Signed)
[facility] Department of Dental Medicine Ziyanna Tolin B. Leor Whyte, D.M.D. Phone: [phone] Fax: [phone]   It was a pleasure seeing you today!  Please refer to the information below regarding your dental visit with us.  Call us if any questions or concerns come up after you leave.   Thank you for giving us the opportunity to provide care for you.  If there is anything we can do for you, please let us know.    RECOMMENDATIONS: Brush after meals and at bedtime.  Floss once a day, and use fluoride at bedtime. Use trismus exercises as directed below. Use CLOSYs for dry mouth, and salt water/baking soda rinses to help with any mouth sores. Take multiple sips of water as needed.  Stay hydrated. Return to your regular dentist for routine dental care including cleanings and periodic exams.  If you do not have a regular dentist, it is important to establish care at an outside dental office of your choice.  Call us if any problems or concerns arise.    TRISMUS: Trismus is a condition where the jaw does not allow the mouth to open as wide as it usually does.  This can happen almost suddenly, or in other cases the process is so slow, it is hard to notice it-until it is too far along.  When the jaw joints and/or muscles have been exposed to radiation treatments, the onset of trismus is very slow.  This is because the muscles are losing their stretching ability over a long period of time, as long as 2 YEARS after the end of radiation.  It is therefore important to exercise these muscles and joints.  Trismus exercises: Use the stack of tongue depressors measuring the same or a little less than your maximum opening that was recorded at your first dental visit. Place the stack in your mouth, supporting the other end with your hand. Allow 30 seconds for muscle stretching. Rest for a few seconds. Repeat 3-5 times. For all radiation patients, this exercise is recommended in the mornings and evenings  unless otherwise instructed. The exercises should be done for a period of 2 YEARS after the end of radiation. Maximum jaw opening should be checked routinely on recall dental visits by your general dentist. Report any changes, soreness, or difficulties encountered when doing the exercises to your dentist.   QUESTIONS? Call our office during office hours at [phone].  

## 2021-07-28 NOTE — Therapy (Signed)
[facility] [address] [place], [place], [address] Phone: [phone]   Fax:  [phone]  Patient Details  Name: Gerald [name] MRN: [id number] Date of Birth: 1971-07-14 Referring Provider:  Eppie Gibson, MD  Encounter Date: 07/28/2021  SPEECH THERAPY DISCHARGE SUMMARY  Visits from Start of Care: 5  Current functional level related to goals / functional outcomes:  See goals below from pt's last scheduled appointment. He requested d/c, saying he was doing well.  SLP Short Term Goals - 06/08/2021 1251                SLP SHORT TERM GOAL #1    Title pt will tell SLP why pt is completing HEP with modified independence     Period --   visits, for all STGs    Status Achieved          SLP SHORT TERM GOAL #2    Title pt will complete HEP with rare min A in 2 sessions     Baseline 03-10-21     Status Achieved          SLP SHORT TERM GOAL #3    Title pt will describe 3 overt s/s aspiration PNA with modified independence     Status Achieved          SLP SHORT TERM GOAL #4    Title pt will tell SLP how a food journal could hasten return to a more normalized diet     Status Deferred                     SLP Long Term Goals - 06/08/21 1252                SLP LONG TERM GOAL #1    Title pt will complete HEP with modified independence over three visits     Baseline 04-07-21     Time 2     Period --   visits, for all LTGs    Status On-going          SLP LONG TERM GOAL #2    Title pt will complete HEP with independence over two visits     Status Deferred   duplicate of LTG #1         SLP LONG TERM GOAL #3    Title pt will describe how to modify HEP over time, and the timeline associated with reduction in HEP frequency with modified independence over two sessions     Status Achieved                     Plan - 06/08/21 1251       Clinical Impression Statement Pt cont to appear safe with dysphagia III diet and thin  liquids. Pt has had some regular diet items as well. SLP reveiewed pt's individualized HEP for dysphagia however pt did not complete HEP until last week in September. There are no overt s/s aspiration PNA observed, and none reported by pt at this time. Data indicate that pt's swallow ability will likely decrease over the course of radiation therapy and could very well decline over time following conclusion of their radiation therapy due to muscle disuse atrophy and/or muscle fibrosis. Pt will cont to need to be seen by SLP, but could do so every other month at this time, in order to assess safety of PO intake, assess the need for recommending any objective swallow assessment,  and ensuring pt correctly completes the individualized HEP.     Speech Therapy Frequency --   once approx every 4 weeks    Duration --   90 days/12 weeks    Treatment/Interventions Aspiration precaution training;Pharyngeal strengthening exercises;Diet toleration management by SLP;Trials of upgraded texture/liquids;Internal/external aids;Patient/family education;SLP instruction and feedback;Compensatory strategies     Potential to Achieve Goals Good     SLP Home Exercise Plan provided today     Consulted and Agree with Plan of Care Patient         Remaining deficits: Unknown - pt not seen since September 2022.   Education / Equipment: HEP procedure, HEP rationale, overt s/sx aspiration PNA, late effects head and neck radiation on swallowing.   Patient agrees to discharge. Patient goals were partially met. Patient is being discharged due to the patient's request..      [place], [place] 07/28/2021, 11:54 AM  [facility] [address] [place], [place], [address] Phone: [phone]   Fax:  [phone]

## 2021-08-09 ENCOUNTER — Other Ambulatory Visit: Payer: Self-pay

## 2021-08-09 ENCOUNTER — Encounter: Payer: Self-pay | Admitting: Hematology and Oncology

## 2021-08-09 ENCOUNTER — Inpatient Hospital Stay: Payer: 59

## 2021-08-09 ENCOUNTER — Inpatient Hospital Stay: Payer: 59 | Attending: Hematology and Oncology | Admitting: Hematology and Oncology

## 2021-08-09 VITALS — BP 113/72 | HR 70 | Temp 97.7°F | Resp 18 | Ht 69.0 in | Wt 220.2 lb

## 2021-08-09 DIAGNOSIS — Z79899 Other long term (current) drug therapy: Secondary | ICD-10-CM | POA: Insufficient documentation

## 2021-08-09 DIAGNOSIS — C109 Malignant neoplasm of oropharynx, unspecified: Secondary | ICD-10-CM

## 2021-08-09 DIAGNOSIS — C090 Malignant neoplasm of tonsillar fossa: Secondary | ICD-10-CM

## 2021-08-09 DIAGNOSIS — D696 Thrombocytopenia, unspecified: Secondary | ICD-10-CM | POA: Diagnosis not present

## 2021-08-09 DIAGNOSIS — Z9221 Personal history of antineoplastic chemotherapy: Secondary | ICD-10-CM | POA: Insufficient documentation

## 2021-08-09 DIAGNOSIS — D539 Nutritional anemia, unspecified: Secondary | ICD-10-CM | POA: Diagnosis not present

## 2021-08-09 DIAGNOSIS — Z923 Personal history of irradiation: Secondary | ICD-10-CM | POA: Diagnosis not present

## 2021-08-09 DIAGNOSIS — C760 Malignant neoplasm of head, face and neck: Secondary | ICD-10-CM

## 2021-08-09 LAB — COMPREHENSIVE METABOLIC PANEL
ALT: 17 U/L (ref 0–44)
AST: 18 U/L (ref 15–41)
Albumin: 4.3 g/dL (ref 3.5–5.0)
Alkaline Phosphatase: 56 U/L (ref 38–126)
Anion gap: 5 (ref 5–15)
BUN: 15 mg/dL (ref 6–20)
CO2: 29 mmol/L (ref 22–32)
Calcium: 9.2 mg/dL (ref 8.9–10.3)
Chloride: 106 mmol/L (ref 98–111)
Creatinine, Ser: 1.18 mg/dL (ref 0.61–1.24)
GFR, Estimated: >60 mL/min (ref >60)
Glucose, Bld: 93 mg/dL (ref 70–99)
Potassium: 4.3 mmol/L (ref 3.5–5.1)
Sodium: 140 mmol/L (ref 135–145)
Total Bilirubin: 0.8 mg/dL (ref 0.3–1.2)
Total Protein: 6.6 g/dL (ref 6.5–8.1)

## 2021-08-09 LAB — CBC WITH DIFFERENTIAL/PLATELET
Abs Immature Granulocytes: 0.01 10*3/uL (ref 0.00–0.07)
Basophils Absolute: 0 10*3/uL (ref 0.0–0.1)
Basophils Relative: 1 %
Eosinophils Absolute: 0.2 10*3/uL (ref 0.0–0.5)
Eosinophils Relative: 3 %
HCT: 34.2 % — ABNORMAL LOW (ref 39.0–52.0)
Hemoglobin: 12.2 g/dL — ABNORMAL LOW (ref 13.0–17.0)
Immature Granulocytes: 0 %
Lymphocytes Relative: 17 %
Lymphs Abs: 0.8 10*3/uL (ref 0.7–4.0)
MCH: 35.7 pg — ABNORMAL HIGH (ref 26.0–34.0)
MCHC: 35.7 g/dL (ref 30.0–36.0)
MCV: 100 fL (ref 80.0–100.0)
Monocytes Absolute: 0.3 10*3/uL (ref 0.1–1.0)
Monocytes Relative: 5 %
Neutro Abs: 3.4 10*3/uL (ref 1.7–7.7)
Neutrophils Relative %: 74 %
Platelets: 127 10*3/uL — ABNORMAL LOW (ref 150–400)
RBC: 3.42 MIL/uL — ABNORMAL LOW (ref 4.22–5.81)
RDW: 11.9 % (ref 11.5–15.5)
WBC: 4.7 10*3/uL (ref 4.0–10.5)
nRBC: 0 % (ref 0.0–0.2)

## 2021-08-09 LAB — TSH: TSH: 4.77 u[IU]/mL — ABNORMAL HIGH (ref 0.320–4.118)

## 2021-08-09 NOTE — Progress Notes (Signed)
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 He underwent a CT neck which showed primarily a submucosal 3 cm mass within the left tonsillar fossa also noted to have 3.2 cm left jugulodigastric lymph node, findings were concerning for left tonsillar malignancy and malignant lymph node. He underwent left tonsillectomy by Dr. [name] and pathology showed invasive squamous cell carcinoma, HPV mediated.  He He had PET imaging which showed evidence of left tonsillar [name] and left ipsilateral lymph nodes, from my review, no evidence of distant metastasis, I could not see an official report on the PET imaging. He is here for initial recommendations.   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 [name] and pathology showed invasive squamous cell carcinoma, HPV mediated.   12/03/2020 PET scan   Hypermetabolic lesion in the left tonsillar fossa corresponding to the abnormal findings on the neck CT.  Lesion was confined to the mucosal/submucosal tissue.  Hypermetabolic ipsilateral metastatic level 2 lymph node   12/06/2020 Initial Diagnosis   He underwent a CT neck due to chronic sore throat x 3 months which showed primarily a submucosal 3 cm mass within the left tonsillar fossa also noted to  have 3.2 cm left jugulodigastric lymph node, findings were concerning for left tonsillar malignancy and malignant lymph node.      12/06/2020 [name] Staging   Staging form: Pharynx - HPV-Mediated Oropharynx, AJCC 8th Edition - Clinical stage from 12/06/2020: Stage I (cT2, cN1, cM0, p16+) - Signed by Benay Pike, MD on 12/06/2020 Stage prefix: Initial diagnosis    12/27/2020 - 01/26/2021 Chemotherapy   Cisplatin every 7 days x 5 cycles   12/28/2020 - 02/11/2021 Radiation Therapy   Left tonsil and bilateral neck / received 62 Gy in 31 fractions (treatment stopped prematurely, 70Gy had been planned)   05/26/2021 PET scan   PET scan completed and reviewed by Dr. Camelia Eng with patient showing no evidence of disease.   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 [name] and pathology showed invasive squamous cell carcinoma, HPV mediated.   12/03/2020 PET scan   Hypermetabolic lesion in the left tonsillar fossa corresponding to the abnormal findings on the neck CT.  Lesion was confined to the mucosal/submucosal tissue.  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 Camelia Eng with patient showing no evidence of disease.     CURRENT THERAPY: observation  INTERVAL HISTORY:  Gerald [name] 51 y.o. male returns for f/u of his tonsillar [name].  Since last  visit, he has been doing well.  He has recovered for the most part.  He still is not a his baseline.  He has some issues with hearing especially high-frequency or high-pitched voices.  He has noticed some mild tinnitus as well.  He says he can hear his wife and occasionally she gets frustrated about this.  Otherwise he has been able to eat most foods, has difficulty eating bread pizza etc.  He has been gaining weight he is now 220 pounds.  He would like to stay around 210 pounds and has to change his diet to lose weight.  He denies any changes in his voice.  He does notice some sore throat when he wakes up in the morning which he attributes to stuffed nose and that this improves within an hour of waking up.  No change in breathing.  No change in bowel or urinary habits reported.  He went to see a urologist since his last PET/CT and was told to take Flomax and Mobic can return to clinic for follow-up.  He is yet to see ENT for surveillance.  He was hoping that we can reevaluate his anemia. Rest of the pertinent 10 point ROS reviewed and negative.   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  MEDICAL HISTORY: Past Medical History:  Diagnosis Date   [name] [facility])    Sleep apnea     Tonsillar mass 11/16/2020    SURGICAL HISTORY: Past Surgical History:  Procedure Laterality Date   DIRECT LARYNGOSCOPY N/A 11/22/2020   Procedure: DIRECT LARYNGOSCOPY WITH BIOPSY;  Surgeon: Leta Baptist, MD;  Location: [facility];  Service: ENT;  Laterality: N/A;   FOOT SURGERY  2000   extra nivicular removal   IR GASTROSTOMY TUBE MOD SED  12/24/2020   IR GASTROSTOMY TUBE REMOVAL  04/11/2021   IR IMAGING GUIDED PORT INSERTION  12/24/2020   SPINE SURGERY     TONSILLECTOMY Left 11/22/2020   Procedure: LEFT TONSILLECTOMY;  Surgeon: Leta Baptist, MD;  Location: [facility];  Service: ENT;  Laterality: Left;    SOCIAL HISTORY: Social History   Socioeconomic History   Marital status: Married    Spouse name: Not on file   Number of children: Not on file   Years of education: Not on file   Highest education level: Not on file  Occupational History   Not on file  Tobacco Use   Smoking status: Never   Smokeless tobacco: Never  Vaping  Use   Vaping Use: Never used  Substance and Sexual Activity   Alcohol use: Not Currently    Comment: rare   Drug use: Never   Sexual activity: Not Currently  Other Topics Concern   Not on file  Social History Narrative   Not on file   Social Determinants of Health   Financial Resource Strain: Low Risk    Difficulty of Paying Living Expenses: Not hard at all  Food Insecurity: No Food Insecurity   Worried About Charity fundraiser in the Last Year: Never true   [place] in the Last Year: Never true  Transportation Needs: No Transportation Needs   Lack of Transportation (Medical): No   Lack of Transportation (Non-Medical): No  Physical Activity: Not on file  Stress: No Stress Concern Present   Feeling of Stress : Only a little  Social Connections: Engineer, building services of Communication with Friends and Family: More than three times a week   Frequency of Social Gatherings with Friends and Family: Three times a  week   Attends Religious Services: 1 to 4 times per year   Active Member of Clubs or Organizations: Yes   Attends Archivist Meetings: 1 to 4 times per year   Marital Status: Married  Human resources officer Violence: Not At Risk   Fear of Current or Ex-Partner: No   Emotionally Abused: No   Physically Abused: No   Sexually Abused: No    FAMILY HISTORY: Non contributory  Review of Systems  Constitutional:  Positive for fatigue. Negative for appetite change, chills, fever and unexpected weight change.  HENT:   Positive for hearing loss, sore throat and tinnitus. Negative for lump/mass and trouble swallowing.   Eyes:  Negative for eye problems and icterus.  Respiratory:  Negative for chest tightness, cough and shortness of breath.   Cardiovascular:  Negative for chest pain, leg swelling and palpitations.  Gastrointestinal:  Negative for abdominal distention, abdominal pain, constipation, diarrhea, nausea and vomiting.  Endocrine: Negative for hot flashes.  Genitourinary:  Negative for difficulty urinating.   Musculoskeletal:  Negative for arthralgias.  Skin:  Negative for itching and rash.  Neurological:  Negative for dizziness, extremity weakness, headaches and numbness.  Hematological:  Negative for adenopathy. Does not bruise/bleed easily.  Psychiatric/Behavioral:  Negative for depression. The patient is not nervous/anxious.      PHYSICAL EXAMINATION  ECOG PERFORMANCE STATUS: 1 - Symptomatic but completely ambulatory  Vitals:   08/09/21 0943  BP: 113/72  Pulse: 70  Resp: 18  Temp: 97.7 F (36.5 C)  SpO2: 100%    Physical Exam Constitutional:      General: He is not in acute distress.    Appearance: Normal appearance. He is not toxic-appearing.  HENT:     Head: Normocephalic and atraumatic.  Eyes:     General: No scleral icterus. Cardiovascular:     Rate and Rhythm: Normal rate and regular rhythm.     Pulses: Normal pulses.     Heart sounds: Normal heart  sounds.  Pulmonary:     Effort: Pulmonary effort is normal.     Breath sounds: Normal breath sounds.  Abdominal:     General: Abdomen is flat. Bowel sounds are normal. There is no distension.     Palpations: Abdomen is soft.     Tenderness: There is no abdominal tenderness.  Musculoskeletal:        General: No swelling.     Cervical back: Neck  supple.  Lymphadenopathy:     Cervical: No cervical adenopathy.  Skin:    General: Skin is warm and dry.     Findings: No rash.  Neurological:     General: No focal deficit present.     Mental Status: He is alert.  Psychiatric:        Mood and Affect: Mood normal.        Behavior: Behavior normal.    LABORATORY DATA:  CBC    Component Value Date/Time   WBC 4.7 08/09/2021 1035   RBC 3.42 (L) 08/09/2021 1035   HGB 12.2 (L) 08/09/2021 1035   HGB 10.6 (L) 03/22/2021 0815   HCT 34.2 (L) 08/09/2021 1035   HCT 33.9 (L) 04/05/2021 1031   PLT 127 (L) 08/09/2021 1035   PLT 146 (L) 03/22/2021 0815   MCV 100.0 08/09/2021 1035   MCH 35.7 (H) 08/09/2021 1035   MCHC 35.7 08/09/2021 1035   RDW 11.9 08/09/2021 1035   LYMPHSABS 0.8 08/09/2021 1035   MONOABS 0.3 08/09/2021 1035   EOSABS 0.2 08/09/2021 1035   BASOSABS 0.0 08/09/2021 1035    CMP     Component Value Date/Time   NA 141 06/08/2021 1100   K 3.9 06/08/2021 1100   CL 109 06/08/2021 1100   CO2 24 06/08/2021 1100   GLUCOSE 102 (H) 06/08/2021 1100   BUN 9 06/08/2021 1100   CREATININE 1.08 06/08/2021 1100   CREATININE 0.88 03/22/2021 0815   CALCIUM 8.6 (L) 06/08/2021 1100   PROT 6.1 (L) 06/08/2021 1100   ALBUMIN 4.0 06/08/2021 1100   AST 17 06/08/2021 1100   ALT 14 06/08/2021 1100   ALKPHOS 56 06/08/2021 1100   BILITOT 1.0 06/08/2021 1215   BILITOT 0.8 06/08/2021 1100   GFRNONAA >60 06/08/2021 1100   GFRNONAA >60 03/22/2021 0815       ASSESSMENT and THERAPY PLAN:   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HCC) Gerald [name] is a 51 year old [place] man with 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  He has undergone tonsillectomy followed by adjuvant radiation with concurrent cisplatin that completed July 2022.  1.  Squamous cell of the tonsillar fossa.  No concern for recurrence.  We have discussed the importance of surveillance with ENT, he will stop by their office today and make a follow-up.  2.  Neck lymphedema this is improving.  This has improved remarkably since his last visit.  3.  Macrocytic anemia.  Labs from today show significant improvement in hemoglobin, hemoglobin of 12.2.   4.  Family history of hemochromatosis he also reports family history of hemochromatosis and elevated ferritin likely because of that but he does not want to pursue evaluation for hemochromatosis.  He was recommended to consider blood donation periodically since he has very strong family history of hemochromatosis and he expressed understanding.  5.  Mild thrombocytopenia, will continue to monitor.  6.  Tinnitus and some hearing loss likely from use of cisplatin.   All questions were answered. The patient knows to call the clinic with any problems, questions or concerns. We can certainly see the patient much sooner if necessary.  Total time spent: 35 minutes  *Total Encounter Time as defined by the Centers for Medicare and Medicaid Services includes, in addition to the face-to-face time of a patient visit (documented in the note above) non-face-to-face time: obtaining and reviewing outside history, ordering and reviewing medications, tests or procedures, care coordination (communications with other health care professionals or caregivers)  and documentation in the medical record.

## 2021-08-09 NOTE — Assessment & Plan Note (Signed)
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  He has undergone tonsillectomy followed by adjuvant radiation with concurrent cisplatin that completed July 2022.  1.  Squamous cell of the tonsillar fossa.  No concern for recurrence.  We have discussed the importance of surveillance with ENT, he will stop by their office today and make a follow-up.  2.  Neck lymphedema this is improving.  This has improved remarkably since his last visit.  3.  Macrocytic anemia.  Labs from today show significant improvement in hemoglobin, hemoglobin of 12.2.   4.  Family history of hemochromatosis he also reports family history of hemochromatosis and elevated ferritin likely because of that but he does not want to pursue evaluation for hemochromatosis.  He was recommended to consider blood donation periodically since he has very strong family history of hemochromatosis and he expressed understanding.  5.  Mild thrombocytopenia, will continue to monitor.  6.  Tinnitus and some hearing loss likely from use of cisplatin.

## 2021-08-25 ENCOUNTER — Other Ambulatory Visit: Payer: Self-pay

## 2021-08-25 ENCOUNTER — Ambulatory Visit (HOSPITAL_COMMUNITY)
Admission: RE | Admit: 2021-08-25 | Discharge: 2021-08-25 | Disposition: A | Discharge status: Home / Self Care | Payer: 59 | Source: Ambulatory Visit | Attending: Hematology and Oncology | Admitting: Hematology and Oncology

## 2021-08-25 DIAGNOSIS — C109 Malignant neoplasm of oropharynx, unspecified: Secondary | ICD-10-CM | POA: Insufficient documentation

## 2021-08-25 DIAGNOSIS — Z452 Encounter for adjustment and management of vascular access device: Secondary | ICD-10-CM | POA: Insufficient documentation

## 2021-08-25 HISTORY — PX: IR REMOVAL TUN ACCESS W/ PORT W/O FL MOD SED: IMG2290

## 2021-08-25 MED ORDER — LIDOCAINE-EPINEPHRINE 1 %-1:100000 IJ SOLN
INTRAMUSCULAR | Status: AC
  Filled 2021-08-25: qty 1

## 2021-08-25 MED ORDER — LIDOCAINE-EPINEPHRINE 1 %-1:100000 IJ SOLN
INTRAMUSCULAR | Status: DC | PRN
  Administered 2021-08-25: 20 mL

## 2021-09-29 DIAGNOSIS — C109 Malignant neoplasm of oropharynx, unspecified: Secondary | ICD-10-CM | POA: Insufficient documentation

## 2021-09-29 DIAGNOSIS — J300 Vasomotor rhinitis: Secondary | ICD-10-CM | POA: Insufficient documentation

## 2021-09-29 HISTORY — DX: Malignant neoplasm of oropharynx, unspecified: C10.9

## 2021-10-03 ENCOUNTER — Ambulatory Visit: Payer: Managed Care, Other (non HMO) | Admitting: Hematology and Oncology

## 2021-10-21 LAB — LAB REPORT - SCANNED
A1c: 4.1
EGFR: 64

## 2021-11-07 ENCOUNTER — Encounter (HOSPITAL_BASED_OUTPATIENT_CLINIC_OR_DEPARTMENT_OTHER): Payer: Self-pay | Admitting: Nurse Practitioner

## 2021-11-07 ENCOUNTER — Ambulatory Visit (HOSPITAL_BASED_OUTPATIENT_CLINIC_OR_DEPARTMENT_OTHER): Payer: 59 | Admitting: Nurse Practitioner

## 2021-11-07 VITALS — BP 117/72 | HR 59 | Ht 69.0 in | Wt 220.0 lb

## 2021-11-07 DIAGNOSIS — Z8589 Personal history of malignant neoplasm of other organs and systems: Secondary | ICD-10-CM

## 2021-11-07 DIAGNOSIS — R21 Rash and other nonspecific skin eruption: Secondary | ICD-10-CM | POA: Diagnosis not present

## 2021-11-07 DIAGNOSIS — J029 Acute pharyngitis, unspecified: Secondary | ICD-10-CM

## 2021-11-07 DIAGNOSIS — D61818 Other pancytopenia: Secondary | ICD-10-CM | POA: Diagnosis not present

## 2021-11-07 DIAGNOSIS — L309 Dermatitis, unspecified: Secondary | ICD-10-CM

## 2021-11-07 DIAGNOSIS — H9313 Tinnitus, bilateral: Secondary | ICD-10-CM

## 2021-11-07 DIAGNOSIS — E039 Hypothyroidism, unspecified: Secondary | ICD-10-CM

## 2021-11-07 DIAGNOSIS — Z923 Personal history of irradiation: Secondary | ICD-10-CM

## 2021-11-07 DIAGNOSIS — C109 Malignant neoplasm of oropharynx, unspecified: Secondary | ICD-10-CM

## 2021-11-07 LAB — POCT RAPID STREP A (OFFICE): Rapid Strep A Screen: NEGATIVE

## 2021-11-07 MED ORDER — AMOXICILLIN-POT CLAVULANATE 875-125 MG PO TABS: 1.0000 | ORAL_TABLET | Freq: Two times a day (BID) | ORAL | 0 refills | Status: DC

## 2021-11-07 NOTE — Patient Instructions (Addendum)
Thank you for choosing [place] at [facility] for your Primary Care needs. I am excited for the opportunity to partner with you to meet your health care goals. It was a pleasure meeting you today! ? ?Recommendations from today's visit: ?We will check an ANA today and see if there is any evidence of autoimmune dysfunction ?We can start antibiotics to help with your symptoms. If these do not get any better, please let us know.  ? ?Information on diet, exercise, and health maintenance recommendations are listed below. This is information to help you be sure you are on track for optimal health and monitoring.  ? ?Please look over this and let us know if you have any questions or if you have completed any of the health maintenance outside of [facility] so that we can be sure your records are up to date.  ?___________________________________________________________ ?About Me: ?I am an Adult-Geriatric Nurse Practitioner with a background in caring for patients for more than 20 years with a strong intensive care background. I provide primary care and sports medicine services to patients age 48 and older within this office. My education had a strong focus on caring for the older adult population, which I am passionate about. I am also the director of the APP Fellowship with [facility].  ? ?My desire is to provide you with the best service through preventive medicine and supportive care. I consider you a part of the medical team and value your input. I work diligently to ensure that you are heard and your needs are met in a safe and effective manner. I want you to feel comfortable with me as your provider and want you to know that your health concerns are important to me. ? ?For your information, our office hours are: ?Monday, Tuesday, and Thursday 8:00 AM - 5:00 PM ?Wednesday and Friday 8:00 AM - 12:00 PM.  ? ?In my time away from the office I am teaching new APP's within the system and am  unavailable, but my partner, Dr. Burnard Bunting is in the office for emergent needs.  ? ?If you have questions or concerns, please call our office at [phone] or send [place] a MyChart message and we will respond as quickly as possible.  ?____________________________________________________________ ?MyChart:  ?For all urgent or time sensitive needs we ask that you please call the office to avoid delays. Our number is [phone]. ?MyChart is not constantly monitored and due to the large volume of messages a day, replies may take up to 72 business hours. ? ?MyChart Policy: ?MyChart allows for you to see your visit notes, after visit summary, provider recommendations, lab and tests results, make an appointment, request refills, and contact your provider or the office for non-urgent questions or concerns. Providers are seeing patients during normal business hours and do not have built in time to review MyChart messages.  ?We ask that you allow a minimum of 3 business days for responses to [employer]. For this reason, please do not send urgent requests through [place]. Please call the office at [phone]. ?New and ongoing conditions may require a visit. We have virtual and in person visit available for your convenience.  ?Complex MyChart concerns may require a visit. Your provider may request you schedule a virtual or in person visit to ensure we are providing the best care possible. ?MyChart messages sent after 11:00 AM on Friday will not be received by the provider until Monday morning.  ?  ?Lab and Test Results: ?  You will receive your lab and test results on MyChart as soon as they are completed and results have been sent by the lab or testing facility. Due to this service, you will receive your results BEFORE your provider.  ?I review lab and tests results each morning prior to seeing patients. Some results require collaboration with other providers to ensure you are receiving the most appropriate care. For this  reason, we ask that you please allow a minimum of 3-5 business days from the time the ALL results have been received for your provider to receive and review lab and test results and contact you about these.  ?Most lab and test result comments from the provider will be sent through [place]. Your provider may recommend changes to the plan of care, follow-up visits, repeat testing, ask questions, or request an office visit to discuss these results. You may reply directly to this message or call the office at [phone] to provide information for the provider or set up an appointment. ?In some instances, you will be called with test results and recommendations. Please let us know if this is preferred and we will make note of this in your chart to provide this for you.    ?If you have not heard a response to your lab or test results in 5 business days from all results returning to [place], please call the office to let us know. We ask that you please avoid calling prior to this time unless there is an emergent concern. Due to high call volumes, this can delay the resulting process. ? ?After Hours: ?For all non-emergency after hours needs, please call the office at [phone] and select the option to reach the on-call provider service. On-call services are shared between multiple [facility] offices and therefore it will not be possible to speak directly with your provider. On-call providers may provide medical advice and recommendations, but are unable to provide refills for maintenance medications.  ?For all emergency or urgent medical needs after normal business hours, we recommend that you seek care at the closest Urgent Care or Emergency Department to ensure appropriate treatment in a timely manner.  ?[facility] at [facility] has a 24 hour emergency room located on the ground floor for your convenience.  ? ?Urgent Concerns During the Business Day ?Providers are seeing patients from 8AM to [place] with a  busy schedule and are most often not able to respond to non-urgent calls until the end of the day or the next business day. ?If you should have URGENT concerns during the day, please call and speak to the nurse or schedule a same day appointment so that we can address your concern without delay.  ? ?Thank you, again, for choosing me as your health care partner. I appreciate your trust and look forward to learning more about you.  ? ?Worthy Keeler, DNP, AGNP-c ?___________________________________________________________ ? ?Health Maintenance Recommendations ?Screening Testing ?Mammogram ?Every 1 -2 years based on history and risk factors ?Starting at age 61 ?Pap Smear ?Ages 21-39 every 3 years ?Ages 74-65 every 5 years with HPV testing ?More frequent testing may be required based on results and history ?Colon Cancer Screening ?Every 1-10 years based on test performed, risk factors, and history ?Starting at age 83 ?Bone Density Screening ?Every 2-10 years based on history ?Starting at age 1 for women ?Recommendations for men differ based on medication usage, history, and risk factors ?AAA Screening ?One time ultrasound ?Men 71-34 years old who have every smoked ?Lung Cancer  Screening ?Low Dose Lung CT every 12 months ?Age 20-80 years with a 30 pack-year smoking history who still smoke or who have quit within the last 15 years ? ?Screening Labs ?Routine  Labs: Complete Blood Count (CBC), Complete Metabolic Panel (CMP), Cholesterol (Lipid Panel) ?Every 6-12 months based on history and medications ?May be recommended more frequently based on current conditions or previous results ?Hemoglobin A1c Lab ?Every 3-12 months based on history and previous results ?Starting at age 36 or earlier with diagnosis of diabetes, high cholesterol, BMI >26, and/or risk factors ?Frequent monitoring for patients with diabetes to ensure blood sugar control ?Thyroid Panel (TSH w/ T3 & T4) ?Every 6 months based on history, symptoms, and risk  factors ?May be repeated more often if on medication ?HIV ?One time testing for all patients 17 and older ?May be repeated more frequently for patients with increased risk factors or exposure ?Hepatitis C ?One t

## 2021-11-07 NOTE — Progress Notes (Signed)
?Orma Render, DNP, AGNP-c ?Primary Care & Sports Medicine ?[place], [place] [address] ?[phone] ? ?New patient visit ? ? ?Patient: Gerald [name]   DOB: 1971/04/03   51 y.o. Male  MRN: [id number] ?Visit Date: 11/07/2021 ? ?Patient Care Team: ?Emiliana Blaize, Coralee Pesa, NP as PCP - General (Nurse Practitioner) ?Malmfelt, Stephani Police, RN as Oncology Nurse Navigator ?Eppie Gibson, MD as Consulting Physician (Radiation Oncology) ?Benay Pike, MD as Consulting Physician (Hematology and Oncology) ?Leta Baptist, MD as Consulting Physician (Otolaryngology) ? ?Today's Vitals  ? 11/07/21 1039  ?BP: 117/72  ?Pulse: (!) 59  ?SpO2: 96%  ?Weight: 220 lb (99.8 kg)  ?Height: '5\' 9"'$  (1.753 m)  ? ?Body mass index is 32.49 kg/m?.  ? ?Today's healthcare provider: Orma Render, NP  ? ?Chief Complaint  ?Patient presents with  ? Establish Care  ? Cough  ? Sore Throat  ? ?Subjective  ?  ?Gerald [name] is a 51 y.o. male who presents today as a new patient to establish care.  ?  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 ?Cough/Sore Throat ?- chronic for last 10 years ?- productive ?- no fever, chills, sinus pain/pressure ?- chronic HA and tinnitus ? ?Post chemo reactions ?- anemia ?- dermatitis to hands ?- rashes on body ?- thyroid changes ?- He endorses concerns about hemolytic anemia or autoimmune issues d/t symptoms ? ?He tells me he previously worked at a  [employer], [employer].  ?He also reports he is very in tune with his body and is able to feel when something is not right. He reports that he feels right now that he has concerns for autoimmune conditions . ? ?History reviewed and reveals the following: ?Past Medical History:  ?Diagnosis Date  ? Cancer [facility])   ? 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 Dehydration 01/29/2021  ? Mucositis due to antineoplastic therapy 03/31/2021  ? Sleep apnea   ? Tonsillar mass 11/16/2020  ? Weight loss, unintentional 01/04/2021  ? ?Past Surgical History:  ?Procedure Laterality Date  ? DIRECT LARYNGOSCOPY N/A 11/22/2020  ? Procedure: DIRECT LARYNGOSCOPY WITH BIOPSY;  Surgeon: Leta Baptist, MD;  Location: [facility];  Service: ENT;  Laterality: N/A;  ? FOOT SURGERY  2000  ? extra nivicular removal  ? IR GASTROSTOMY TUBE MOD SED  12/24/2020  ? IR GASTROSTOMY TUBE REMOVAL  04/11/2021  ? IR IMAGING GUIDED PORT INSERTION  12/24/2020  ? IR REMOVAL TUN ACCESS W/ PORT W/O FL MOD SED  08/25/2021  ? SPINE SURGERY    ? TONSILLECTOMY Left 11/22/2020  ? Procedure: LEFT TONSILLECTOMY;  Surgeon: Leta Baptist, MD;  Location: [facility];  Service: ENT;  Laterality: Left;  ? ?Family Status  ?Relation Name Status  ? Mother  Alive  ? Father  Deceased  ? Brother  Alive  ? MGM  Deceased  ? MGF  Deceased  ? PGM  Deceased  ? PGF  Deceased  ? ?  History reviewed. No pertinent family history. ?Social History  ? ?Socioeconomic History  ? Marital status: Married  ?  Spouse name: Not on file  ? Number of children: Not on file  ? Years of education: Not on file  ? Highest education level: Not on file  ?Occupational History  ? Not on file  ?Tobacco Use  ? Smoking status: Never  ? Smokeless tobacco: Never  ?Vaping Use  ? Vaping Use: Never used  ?Substance and Sexual Activity  ? Alcohol use: Not Currently  ?  Comment: rare  ? Drug use: Never  ? Sexual activity: Not Currently  ?Other Topics Concern  ? Not on file  ?Social History Narrative  ? Not on file   ? ?Social Determinants of Health  ? ?Financial Resource Strain: Low Risk   ? Difficulty of Paying Living Expenses: Not hard at all  ?Food Insecurity: No Food Insecurity  ? Worried About Charity fundraiser in the Last Year: Never true  ? Ran Out of Food in the Last Year: Never true  ?Transportation Needs: No Transportation Needs  ? Lack of Transportation (Medical): No  ? Lack of Transportation (Non-Medical): No  ?Physical Activity: Not on file  ?Stress: No Stress Concern Present  ? Feeling of Stress : Only a little  ?Social Connections: Socially Integrated  ? Frequency of Communication with Friends and Family: More than three times a week  ? Frequency of Social Gatherings with Friends and Family: Three times a week  ? Attends Religious Services: 1 to 4 times per year  ? Active Member of Clubs or Organizations: Yes  ? Attends Archivist Meetings: 1 to 4 times per year  ? Marital Status: Married  ? ?Outpatient Medications Prior to Visit  ?Medication Sig  ? ipratropium (ATROVENT) 0.03 % nasal spray Place into the nose.  ? lidocaine (LIDODERM) 5 % APPLY 1 PATCH TO SKIN ONCE DAILY AS NEEDED (APPLY FOR 12 HOURS, THEN REMOVE FOR 12 HOURS)  ? meloxicam (MOBIC) 15 MG tablet TAKE ONE TABLET BY MOUTH DAILY AS NEEDED PAIN  ? [DISCONTINUED] meloxicam (MOBIC) 7.5 MG tablet Take 7.5 mg by mouth daily.  ? betamethasone dipropionate (DIPROLENE) 0.05 % ointment Apply topically 2 (two) times daily.  ? [DISCONTINUED] azelastine (ASTELIN) 0.1 % nasal spray Place 2 sprays into both nostrils 2 (two) times daily.  ? [DISCONTINUED] sodium fluoride (SODIUM FLUORIDE 5000 PPM) 1.1 % GEL dental gel Place 1 application onto teeth at bedtime. Place 1 pea-size drop into each tooth space of fluoride trays once a day at bedtime.  Leave trays in for 5 minutes and then remove.  Spit out excess fluoride, but DO NOT rinse with water, eat or drink for at least 30 minutes after use. (Patient not taking: Reported on 11/07/2021)  ? [DISCONTINUED]  tamsulosin (FLOMAX) 0.4 MG CAPS capsule Take 0.4 mg by mouth daily. (Patient not taking: Reported on 11/07/2021)  ? ?No facility-administered medications prior to visit.  ? ?No Known Allergies ?Immunization History  ?Administered Date(s) Administered  ? Janssen (J&J) SARS-COV-2 Vaccination 01/04/2020  ? ? ?Review of Systems ?All review of systems negative except what is listed in the HPI ? ? Objective  ?  ?BP 117/72   Pulse (!) 59   Ht '5\' 9"'$  (1.753 m)   Wt 220 lb (99.8 kg)   SpO2 96% Comment: initially reported at 86%- recheck showed 96-97%  BMI 32.49 kg/m?  ?Physical Exam ?Vitals and nursing note reviewed.  ?Constitutional:   ?  General: He is not in acute distress. ?   Appearance: He is well-developed.  ?HENT:  ?   Head: Normocephalic.  ?   Right Ear: Tympanic membrane normal.  ?   Left Ear: Tympanic membrane normal.  ?   Nose: Congestion present. No rhinorrhea.  ?   Mouth/Throat:  ?   Mouth: Mucous membranes are moist.  ?   Pharynx: Posterior oropharyngeal erythema present.  ?   Comments: Surgical removal of oropharyngeal structures with no alarm symptoms present at this time. Erythema is present to the entire posterior oropharynx with no exudate noted. Cobblestoning is present with PN drainage evident.  ?Eyes:  ?   Conjunctiva/sclera: Conjunctivae normal.  ?Neck:  ?   Thyroid: No thyromegaly.  ?Cardiovascular:  ?   Rate and Rhythm: Normal rate and regular rhythm.  ?   Heart sounds: Normal heart sounds.  ?Pulmonary:  ?   Effort: Pulmonary effort is normal.  ?   Breath sounds: Rhonchi present.  ?Abdominal:  ?   Palpations: Abdomen is soft.  ?Lymphadenopathy:  ?   Cervical: Cervical adenopathy present.  ?Skin: ?   General: Skin is warm and dry.  ?   Capillary Refill: Capillary refill takes less than 2 seconds.  ?Neurological:  ?   General: No focal deficit present.  ?   Mental Status: He is alert and oriented to person, place, and time.  ?Psychiatric:     ?   Mood and Affect: Mood normal.     ?   Behavior:  Behavior normal.  ? ? ?Results for orders placed or performed in visit on 11/07/21  ?ANA Direct w/Reflex if Positive  ?Result Value Ref Range  ? Anti Nuclear Antibody (ANA) Negative Negative  ?POCT rapid strep A  ?Result

## 2021-11-08 LAB — ANA W/REFLEX IF POSITIVE: Anti Nuclear Antibody (ANA): NEGATIVE

## 2021-11-13 ENCOUNTER — Encounter (HOSPITAL_BASED_OUTPATIENT_CLINIC_OR_DEPARTMENT_OTHER): Payer: Self-pay | Admitting: Nurse Practitioner

## 2021-11-13 DIAGNOSIS — D229 Melanocytic nevi, unspecified: Secondary | ICD-10-CM | POA: Insufficient documentation

## 2021-11-13 DIAGNOSIS — J029 Acute pharyngitis, unspecified: Secondary | ICD-10-CM | POA: Insufficient documentation

## 2021-11-13 DIAGNOSIS — L309 Dermatitis, unspecified: Secondary | ICD-10-CM | POA: Insufficient documentation

## 2021-11-13 DIAGNOSIS — L409 Psoriasis, unspecified: Secondary | ICD-10-CM | POA: Insufficient documentation

## 2021-11-13 DIAGNOSIS — G8929 Other chronic pain: Secondary | ICD-10-CM | POA: Insufficient documentation

## 2021-11-13 DIAGNOSIS — E039 Hypothyroidism, unspecified: Secondary | ICD-10-CM | POA: Insufficient documentation

## 2021-11-13 DIAGNOSIS — Z8589 Personal history of malignant neoplasm of other organs and systems: Secondary | ICD-10-CM | POA: Insufficient documentation

## 2021-11-13 DIAGNOSIS — L821 Other seborrheic keratosis: Secondary | ICD-10-CM | POA: Insufficient documentation

## 2021-11-13 NOTE — Assessment & Plan Note (Signed)
Dermatitis of unknown etiology. Possibly autoimmune related vs idiopathic or allergic Will obtain labs today for ANA evaluation given patients concerns for autoimmune condition present and associated symptoms. Will make changes to plan of care based on findings.  ?

## 2021-11-13 NOTE — Assessment & Plan Note (Signed)
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 At this time will continue to monitor. No alarm symptoms at this time.  ?

## 2021-11-13 NOTE — Assessment & Plan Note (Signed)
Sore throat with associated cough for over 1 week at this time. Strep test today negative, although symptoms are consistent. Will go ahead and send treatment with augmentin for suspected strep pharyngitis vs secondary bacterial infection given the length of time symptoms have been present and associated symptoms.  ?Patient will follow-up if symptoms continue following treatment.  ?

## 2021-11-13 NOTE — Assessment & Plan Note (Signed)
Chronic since chemotherapy treatment with cisplatin. Likely will not improve with time given the etiology. Hearing appears to be intact at this time, but unable to measure this in current setting. Recommend evaluation with ENT or audiology to discuss options for monitoring and treatment, if available. Patient understands that this is likely permanent in nature. He does have an ENT - recommend close follow-up.  ?

## 2021-11-13 NOTE — Assessment & Plan Note (Signed)
Following with oncology- most recent PET scan showed no concerns for recurrence.  ?Has completed surgical removal of tonsils and part of the tongue, cisplatin chemotherapy, and radiation to the head/neck.  ?Concerns with chronic cough, lymphedema of neck, and tinnitus, likely related to the treatments.  ?Recent elevation in TSH likely related to injury to thyroid gland related to radiation to the neck. T4 is currently stable per chart review.  ?Recommend continued surveillence with hem/onc and ENT for maintenance and monitoring.  ?No alarm sx present today. Will continue to collaborate care with specialists and support patient needs.  ?

## 2021-11-13 NOTE — Assessment & Plan Note (Signed)
Historical. Labs reviewed today show improvement with most recent evaluation. Will plan to monitor labs in the future for serial results to determine if further evaluation is needed. May be related to previous cancer therapies, in this case we would expect this to improve with time. Will monitor.  ?

## 2021-11-13 NOTE — Assessment & Plan Note (Signed)
Subclinical with steady T4. Likely related to damage to thyroid gland during radiation treatment for throat cancer. Patient is exhibiting some positive symptoms today for hypothyroidism. Discussed reported labs with patient- will review previous results and plan to follow-up with repeat labs in the next few months and determine if treatment is needed. Will likely eventually need to begin treatment for thyroid insufficiency, at this time joint decision to monitor at this time.  ?

## 2021-11-18 ENCOUNTER — Ambulatory Visit (HOSPITAL_BASED_OUTPATIENT_CLINIC_OR_DEPARTMENT_OTHER): Payer: 59 | Admitting: Nurse Practitioner

## 2021-12-06 ENCOUNTER — Telehealth: Payer: Self-pay | Admitting: Hematology and Oncology

## 2021-12-06 NOTE — Telephone Encounter (Signed)
Rescheduled appointment per providers template. Left message.  ? ?

## 2021-12-29 NOTE — Progress Notes (Incomplete)
Mr. Courtwright presents today for follow-up after completing radiation prematurely to his left tonsil and bilateral neck on 02/11/2021  Pain issues, if any: *** Using a feeding tube?: N/A Weight changes, if any: ***  Swallowing issues, if any: *** Smoking or chewing tobacco? *** Using fluoride trays daily? *** Last ENT visit was on: 09/29/2021 Saw Dr. Izora Gala:  "--Impression & Plans:  Stable, no evidence of recurrent disease. Recommend follow-up every 3 months for the first 2 years and then we will adjust the schedule following that. Instructed him to call immediately if he has a sore throat that does not go away after 1 week, especially if it is unilateral. Also any neck masses new lumps or ear pain. Recommend frequent use of nasal saline spray to prevent nosebleeds. Recommend Atrovent nasal spray for the vasomotor rhinitis."  Other notable issues, if any: ***Last saw his medical oncologist Dr. Chryl Heck on 08/09/21

## 2021-12-30 ENCOUNTER — Ambulatory Visit: Payer: 59 | Attending: Radiation Oncology | Admitting: Radiation Oncology

## 2021-12-30 ENCOUNTER — Telehealth: Payer: Self-pay | Admitting: *Deleted

## 2021-12-30 NOTE — Telephone Encounter (Signed)
Called patient's spouse to inquire if he is coming for fu today, lvm for a return call

## 2022-02-06 ENCOUNTER — Encounter (HOSPITAL_BASED_OUTPATIENT_CLINIC_OR_DEPARTMENT_OTHER): Payer: Self-pay | Admitting: Nurse Practitioner

## 2022-02-06 ENCOUNTER — Ambulatory Visit (HOSPITAL_BASED_OUTPATIENT_CLINIC_OR_DEPARTMENT_OTHER): Payer: 59 | Admitting: Nurse Practitioner

## 2022-02-06 ENCOUNTER — Ambulatory Visit: Payer: 59 | Admitting: Hematology and Oncology

## 2022-02-06 VITALS — BP 112/72 | HR 92 | Resp 14 | Ht 69.0 in | Wt 230.0 lb

## 2022-02-06 DIAGNOSIS — L309 Dermatitis, unspecified: Secondary | ICD-10-CM | POA: Diagnosis not present

## 2022-02-06 DIAGNOSIS — N289 Disorder of kidney and ureter, unspecified: Secondary | ICD-10-CM

## 2022-02-06 DIAGNOSIS — E039 Hypothyroidism, unspecified: Secondary | ICD-10-CM | POA: Diagnosis not present

## 2022-02-06 DIAGNOSIS — D61818 Other pancytopenia: Secondary | ICD-10-CM | POA: Diagnosis not present

## 2022-02-06 DIAGNOSIS — R5382 Chronic fatigue, unspecified: Secondary | ICD-10-CM

## 2022-02-06 DIAGNOSIS — N401 Enlarged prostate with lower urinary tract symptoms: Secondary | ICD-10-CM

## 2022-02-06 DIAGNOSIS — N529 Male erectile dysfunction, unspecified: Secondary | ICD-10-CM

## 2022-02-06 DIAGNOSIS — R3914 Feeling of incomplete bladder emptying: Secondary | ICD-10-CM

## 2022-02-06 MED ORDER — TADALAFIL 5 MG PO TABS: 5.0000 mg | ORAL_TABLET | Freq: Every day | ORAL | 11 refills | Status: DC

## 2022-02-06 NOTE — Patient Instructions (Addendum)
It was a pleasure seeing you today. I hope your time spent with [place] was pleasant and helpful. Please let us know if there is anything we can do to improve the service you receive.   If you hands don't seem to improve, please let me know and we can try some different options.   I am going to check some additional labs for your fatigue. I want to make sure that this is not something more going on. We will also check your testosterone levels to make sure these aren't contributing.   I will work on the [place] prior authorization.    Important Office Information Lab Results If labs were ordered, please note that you will see results through [facility] as soon as they come available from [facility].  It takes up to 5 business days for the results to be routed to me and for me to review them once all of the lab results have come through from [facility]. I will make recommendations based on your results and send these through [facility] or someone from the office will call you to discuss. If your labs are abnormal, we may contact you to schedule a visit to discuss the results and make recommendations.  If you have not heard from [place] within 5 business days or you have waited longer than a week and your lab results have not come through on [facility], please feel free to call the office or send a message through [facility] to follow-up on these labs.   Referrals If referrals were placed today, the office where the referral was sent will contact you either by phone or through [facility] to set up scheduling. Please note that it can take up to a week for the referral office to contact you. If you do not hear from them in a week, please contact the referral office directly to inquire about scheduling.   Condition Treated If your condition worsens or you begin to have new symptoms, please schedule a follow-up appointment for further evaluation. If you are not sure if an appointment is needed, you may call the office to leave a  message for the nurse and someone will contact you with recommendations.  If you have an urgent or life threatening emergency, please do not call the office, but seek emergency evaluation by calling 911 or going to the nearest emergency room for evaluation.   MyChart and Phone Calls Please do not use MyChart for urgent messages. It may take up to 3 business days for MyChart messages to be read by staff and if they are unable to handle the request, an additional 3 business days for them to be routed to me and for my response.  Messages sent to the provider through [facility] do not come directly to the provider, please allow time for these messages to be routed and for me to respond.  We get a large volume of MyChart messages daily and these are responded to in the order received.   For urgent messages, please call the office at [phone] and speak with the front office staff or leave a message on the line of my assistant for guidance.  We are seeing patients from the hours of 8:00 am through 5:00 pm and calls directly to the nurse may not be answered immediately due to seeing patients, but your call will be returned as soon as possible.  Phone  messages received after 4:00 PM Monday through Thursday may not be returned until the following business day. Phone  messages received after 11:00 AM on Friday may not be returned until Monday.   After Hours We share on call hours with providers from other offices. If you have an urgent need after hours that cannot wait until the next business day, please contact the on call provider by calling the office number. A nurse will speak with you and contact the provider if needed for recommendations.  If you have an urgent or life threatening emergency after hours, please do not call the on call provider, but seek emergency evaluation by calling 911 or going to the nearest emergency room for evaluation.   Paperwork All paperwork requires a minimum of 5 days to  complete and return to you or the designated personnel. Please keep this in mind when bringing in forms or sending requests for paperwork completion to the office.

## 2022-02-06 NOTE — Assessment & Plan Note (Signed)
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  Patient is followed by dermatology.  Encourage patient to utilize moisturizer and monitor for cracking and fissures.  If these present encourage patient to follow-up for infection prevention treatment.

## 2022-02-06 NOTE — Assessment & Plan Note (Signed)
Historical.  Will obtain labs today for evaluation to ensure that this has resolved.  Patient is experiencing increased fatigue symptoms which could be related.  We will make changes to plan of care based on lab findings as appropriate.

## 2022-02-06 NOTE — Progress Notes (Signed)
Worthy Keeler, DNP, AGNP-c [place], [place] [address] [phone] Office [phone] Fax  ESTABLISHED PATIENT- Chronic Health and/or Follow-Up Visit  There were no vitals taken for this visit.  Hypothyroidism, Benign Prostatic Hypertrophy, and Erectile Dysfunction   HPI  Gerald [name]  is a 51 y.o. year old male presenting today for evaluation and management of the following: BPH and Erectile Dysfunction Symptoms of urinary urgency and sensation of complete emptying for quite some time.  Has seen urology in the past after PET scan showed "glowy" prostate Has been on flomax and cialis in the past. Flomax minimally helpful.  Recently recommended cialis on daily basis but PA needed and this did not get completed. Daily dosing of this in the past has been helpful for symptoms. Also endorses concerns with ED sx Unable to obtain erection without the help of medication Started prior to chemotherapy and other cancer treatments. He has used Cialis and Viagra for treatment of this in the past with success In the past his testosterone was on the lower end of normal but he was not started on treatment He did recently have testosterone pellets implanted from a weight management provider, he has not noticed any significant difference since this was completed He tells me that the difficulty obtaining erections spontaneously has negatively impacted his marriage  Hypothyroid Has been feeling tired lately Not sure if this is the cancer, chemo, anemia, age, or thyroid related.  No other significant symptoms  ROS All ROS negative with exception of what is listed in HPI  PHYSICAL EXAM Physical Exam Vitals and nursing note reviewed.  Constitutional:      General: He is not in acute distress.    Appearance: Normal appearance.  HENT:     Head: Normocephalic and atraumatic.     Jaw: There is normal jaw occlusion.      Salivary Glands: Right salivary gland is not diffusely enlarged or tender. Left salivary gland is not diffusely enlarged or tender.     Right Ear: Hearing, tympanic membrane, ear canal and external ear normal.     Left Ear: Hearing, tympanic membrane, ear canal and external ear normal.     Nose: Nose normal.     Right Sinus: No maxillary sinus tenderness or frontal sinus tenderness.     Left Sinus: No maxillary sinus tenderness or frontal sinus tenderness.     Mouth/Throat:     Lips: Pink.     Mouth: Mucous membranes are moist.     Tongue: Tongue does not deviate from midline.     Pharynx: Oropharynx is clear.  Eyes:     General: Lids are normal. Vision grossly intact. No scleral icterus.    Extraocular Movements: Extraocular movements intact.     Conjunctiva/sclera: Conjunctivae normal.     Pupils: Pupils are equal, round, and reactive to light.     Funduscopic exam:    Right eye: No hemorrhage. Red reflex present.        Left eye: No hemorrhage. Red reflex present. Neck:     Thyroid: No thyromegaly or thyroid tenderness.     Vascular: No carotid bruit or JVD.  Cardiovascular:     Rate and Rhythm: Normal rate and regular rhythm.     Pulses: Normal pulses.     Heart sounds: Normal heart sounds. No murmur heard. Pulmonary:     Effort: Pulmonary effort is normal. No respiratory distress.     Breath sounds: Normal  breath sounds.  Chest:  Breasts:    Breasts are symmetrical.  Abdominal:     General: Bowel sounds are normal. There is no distension.     Palpations: Abdomen is soft. There is no hepatomegaly, splenomegaly or mass.     Tenderness: There is no abdominal tenderness. There is no right CVA tenderness, left CVA tenderness, guarding or rebound.     Hernia: No hernia is present.  Musculoskeletal:        General: Normal range of motion.     Cervical back: Normal range of motion and neck supple. No tenderness.     Right lower leg: No edema.     Left lower leg: No edema.   Feet:     Right foot:     Skin integrity: Skin integrity normal.     Left foot:     Skin integrity: Skin integrity normal.  Lymphadenopathy:     Cervical: No cervical adenopathy.  Skin:    General: Skin is warm and dry.     Capillary Refill: Capillary refill takes less than 2 seconds.     Findings: No rash.  Neurological:     General: No focal deficit present.     Mental Status: He is alert and oriented to person, place, and time.     Cranial Nerves: No cranial nerve deficit.     Sensory: No sensory deficit.     Motor: No weakness.     Coordination: Coordination normal.     Gait: Gait normal.  Psychiatric:        Attention and Perception: Attention and perception normal.        Mood and Affect: Mood and affect normal.        Speech: Speech normal.        Behavior: Behavior normal. Behavior is cooperative.        Thought Content: Thought content normal.        Cognition and Memory: Cognition and memory normal.        Judgment: Judgment normal.     ASSESSMENT & PLAN Problem List Items Addressed This Visit     Erectile disorder    Patient experiencing difficulty obtaining erection without the use of medication.  This is a chronic issue that has been ongoing.  Has previously been treated with Viagra and Cialis with positive results.  Patient is at this time also experiencing LUTS symptoms related to BPH and it has been recommended that he start Cialis daily. Discussed with patient that erectile dysfunction could be related to testosterone deficiency.  We will obtain labs today to evaluate for possibility of this.  Recommend stop Viagra and will send in Cialis for daily treatment for LUTS/BPH and erectile dysfunction.  We will make changes to plan of care based on lab findings as appropriate.      Pancytopenia ([place])    Historical.  Will obtain labs today for evaluation to ensure that this has resolved.  Patient is experiencing increased fatigue symptoms which could be related.  We  will make changes to plan of care based on lab findings as appropriate.      Relevant Orders   CBC with Differential/Platelet   Comprehensive metabolic panel   Lipid panel   TSH   T4, free   VITAMIN D 25 Hydroxy (Vit-D Deficiency, Fractures)   B12 and Folate Panel   Hemoglobin A1c   Testosterone   Iron, TIBC and Ferritin Panel   Hand dermatitis    Dermatitis of unknown  etiology to left middle finger with lichenification present.  Patient is followed by dermatology.  Encourage patient to utilize moisturizer and monitor for cracking and fissures.  If these present encourage patient to follow-up for infection prevention treatment.      Hypothyroidism - Primary    Chronic. Currently managed on levothyroxine 75 mcg daily.  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 We will plan to repeat labs today for further evaluation.  Patient has some increased fatigue recently which could be related to thyroid levels but with other symptoms also consider testosterone. We will obtain labs today for evaluation and make changes to plan of care based on findings as appropriate.       Relevant Medications   levothyroxine (SYNTHROID) 75 MCG tablet   Other Relevant Orders   CBC with Differential/Platelet   Comprehensive metabolic panel   Lipid panel   TSH   T4, free   VITAMIN D 25 Hydroxy (Vit-D Deficiency, Fractures)   B12 and Folate Panel   Hemoglobin A1c   Testosterone   Iron, TIBC and Ferritin Panel   Benign localized prostatic hyperplasia with lower urinary tract symptoms (LUTS)   Relevant Medications   tadalafil (CIALIS) 5 MG tablet   Other Visit Diagnoses     Chronic fatigue       Relevant Orders   CBC with Differential/Platelet   Comprehensive metabolic panel   Lipid panel   TSH   T4, free   VITAMIN D 25 Hydroxy (Vit-D Deficiency, Fractures)   B12 and Folate Panel   Hemoglobin A1c   Testosterone   Iron, TIBC and Ferritin Panel         FOLLOW-UP Return in about 6 months (around 08/09/2022) for Thyroid, Prostate.  Worthy Keeler, DNP, AGNP-c

## 2022-02-06 NOTE — Assessment & Plan Note (Signed)
Chronic. Currently managed on levothyroxine 75 mcg daily.  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 We will plan to repeat labs today for further evaluation.  Patient has some increased fatigue recently which could be related to thyroid levels but with other symptoms also consider testosterone. We will obtain labs today for evaluation and make changes to plan of care based on findings as appropriate.

## 2022-02-06 NOTE — Assessment & Plan Note (Signed)
Patient experiencing difficulty obtaining erection without the use of medication.  This is a chronic issue that has been ongoing.  Has previously been treated with Viagra and Cialis with positive results.  Patient is at this time also experiencing LUTS symptoms related to BPH and it has been recommended that he start Cialis daily. Discussed with patient that erectile dysfunction could be related to testosterone deficiency.  We will obtain labs today to evaluate for possibility of this.  Recommend stop Viagra and will send in Cialis for daily treatment for LUTS/BPH and erectile dysfunction.  We will make changes to plan of care based on lab findings as appropriate.

## 2022-02-07 ENCOUNTER — Ambulatory Visit: Payer: 59 | Admitting: Hematology and Oncology

## 2022-02-07 LAB — COMPREHENSIVE METABOLIC PANEL
ALT: 15 IU/L (ref 0–44)
AST: 21 IU/L (ref 0–40)
Albumin/Globulin Ratio: 2.4 — ABNORMAL HIGH (ref 1.2–2.2)
Albumin: 4.6 g/dL (ref 4.1–5.1)
Alkaline Phosphatase: 70 IU/L (ref 44–121)
BUN/Creatinine Ratio: 12 (ref 9–20)
BUN: 15 mg/dL (ref 6–24)
Bilirubin Total: 1.1 mg/dL (ref 0.0–1.2)
CO2: 24 mmol/L (ref 20–29)
Calcium: 9.8 mg/dL (ref 8.7–10.2)
Chloride: 102 mmol/L (ref 96–106)
Creatinine, Ser: 1.28 mg/dL — ABNORMAL HIGH (ref 0.76–1.27)
Globulin, Total: 1.9 g/dL (ref 1.5–4.5)
Glucose: 90 mg/dL (ref 70–99)
Potassium: 3.9 mmol/L (ref 3.5–5.2)
Sodium: 139 mmol/L (ref 134–144)
Total Protein: 6.5 g/dL (ref 6.0–8.5)
eGFR: 69 mL/min/{1.73_m2} (ref >59)

## 2022-02-07 LAB — CBC WITH DIFFERENTIAL/PLATELET
Basophils Absolute: 0.1 10*3/uL (ref 0.0–0.2)
Basos: 1 %
EOS (ABSOLUTE): 0.2 10*3/uL (ref 0.0–0.4)
Eos: 3 %
Hematocrit: 42.8 % (ref 37.5–51.0)
Hemoglobin: 14.6 g/dL (ref 13.0–17.7)
Immature Grans (Abs): 0 10*3/uL (ref 0.0–0.1)
Immature Granulocytes: 0 %
Lymphocytes Absolute: 1.1 10*3/uL (ref 0.7–3.1)
Lymphs: 21 %
MCH: 34.9 pg — ABNORMAL HIGH (ref 26.6–33.0)
MCHC: 34.1 g/dL (ref 31.5–35.7)
MCV: 102 fL — ABNORMAL HIGH (ref 79–97)
Monocytes Absolute: 0.4 10*3/uL (ref 0.1–0.9)
Monocytes: 7 %
Neutrophils Absolute: 3.7 10*3/uL (ref 1.4–7.0)
Neutrophils: 68 %
Platelets: 180 10*3/uL (ref 150–450)
RBC: 4.18 x10E6/uL (ref 4.14–5.80)
RDW: 11.4 % — ABNORMAL LOW (ref 11.6–15.4)
WBC: 5.5 10*3/uL (ref 3.4–10.8)

## 2022-02-07 LAB — T4, FREE: Free T4: 1.03 ng/dL (ref 0.82–1.77)

## 2022-02-07 LAB — IRON,TIBC AND FERRITIN PANEL
Ferritin: 859 ng/mL — ABNORMAL HIGH (ref 30–400)
Iron Saturation: 56 % — ABNORMAL HIGH (ref 15–55)
Iron: 156 ug/dL (ref 38–169)
Total Iron Binding Capacity: 278 ug/dL (ref 250–450)
UIBC: 122 ug/dL (ref 111–343)

## 2022-02-07 LAB — B12 AND FOLATE PANEL
Folate: 8.6 ng/mL (ref >3.0)
Vitamin B-12: 444 pg/mL (ref 232–1245)

## 2022-02-07 LAB — HEMOGLOBIN A1C
Est. average glucose Bld gHb Est-mCnc: 82 mg/dL
Hgb A1c MFr Bld: 4.5 % — ABNORMAL LOW (ref 4.8–5.6)

## 2022-02-07 LAB — LIPID PANEL
Chol/HDL Ratio: 6 ratio — ABNORMAL HIGH (ref 0.0–5.0)
Cholesterol, Total: 162 mg/dL (ref 100–199)
HDL: 27 mg/dL — ABNORMAL LOW (ref >39)
LDL Chol Calc (NIH): 98 mg/dL (ref 0–99)
Triglycerides: 214 mg/dL — ABNORMAL HIGH (ref 0–149)
VLDL Cholesterol Cal: 37 mg/dL (ref 5–40)

## 2022-02-07 LAB — VITAMIN D 25 HYDROXY (VIT D DEFICIENCY, FRACTURES): Vit D, 25-Hydroxy: 63.6 ng/mL (ref 30.0–100.0)

## 2022-02-07 LAB — TSH: TSH: 7.24 u[IU]/mL — ABNORMAL HIGH (ref 0.450–4.500)

## 2022-02-10 MED ORDER — LEVOTHYROXINE SODIUM 112 MCG PO TABS: 112.0000 ug | ORAL_TABLET | Freq: Every day | ORAL | 0 refills | Status: DC

## 2022-02-10 NOTE — Addendum Note (Signed)
Addended by: Natalee Tomkiewicz, Clarise Cruz E on: 02/10/2022 03:51 PM   Modules accepted: Orders

## 2022-02-11 ENCOUNTER — Other Ambulatory Visit: Payer: Self-pay | Admitting: Neurology

## 2022-02-11 MED ORDER — HYDROCODONE-ACETAMINOPHEN 5-325 MG PO TABS: 1.0000 | ORAL_TABLET | Freq: Four times a day (QID) | ORAL | 0 refills | Status: DC | PRN

## 2022-02-14 ENCOUNTER — Inpatient Hospital Stay: Payer: 59 | Admitting: Hematology and Oncology

## 2022-03-28 ENCOUNTER — Ambulatory Visit (HOSPITAL_BASED_OUTPATIENT_CLINIC_OR_DEPARTMENT_OTHER): Payer: 59

## 2022-03-28 DIAGNOSIS — N289 Disorder of kidney and ureter, unspecified: Secondary | ICD-10-CM

## 2022-03-28 DIAGNOSIS — D61818 Other pancytopenia: Secondary | ICD-10-CM

## 2022-03-28 DIAGNOSIS — E039 Hypothyroidism, unspecified: Secondary | ICD-10-CM

## 2022-03-28 DIAGNOSIS — R5382 Chronic fatigue, unspecified: Secondary | ICD-10-CM

## 2022-03-28 DIAGNOSIS — N401 Enlarged prostate with lower urinary tract symptoms: Secondary | ICD-10-CM

## 2022-03-29 LAB — COMPREHENSIVE METABOLIC PANEL
ALT: 15 IU/L (ref 0–44)
AST: 16 IU/L (ref 0–40)
Albumin/Globulin Ratio: 2.2 (ref 1.2–2.2)
Albumin: 4.1 g/dL (ref 4.1–5.1)
Alkaline Phosphatase: 61 IU/L (ref 44–121)
BUN/Creatinine Ratio: 14 (ref 9–20)
BUN: 15 mg/dL (ref 6–24)
Bilirubin Total: 0.8 mg/dL (ref 0.0–1.2)
CO2: 23 mmol/L (ref 20–29)
Calcium: 8.6 mg/dL — ABNORMAL LOW (ref 8.7–10.2)
Chloride: 106 mmol/L (ref 96–106)
Creatinine, Ser: 1.11 mg/dL (ref 0.76–1.27)
Globulin, Total: 1.9 g/dL (ref 1.5–4.5)
Glucose: 95 mg/dL (ref 70–99)
Potassium: 4.2 mmol/L (ref 3.5–5.2)
Sodium: 140 mmol/L (ref 134–144)
Total Protein: 6 g/dL (ref 6.0–8.5)
eGFR: 81 mL/min/{1.73_m2} (ref >59)

## 2022-03-29 LAB — TSH: TSH: 2.12 u[IU]/mL (ref 0.450–4.500)

## 2022-03-29 LAB — T4, FREE: Free T4: 1.46 ng/dL (ref 0.82–1.77)

## 2022-03-29 LAB — TESTOSTERONE: Testosterone: 418 ng/dL (ref 264–916)

## 2022-05-17 ENCOUNTER — Encounter (HOSPITAL_COMMUNITY): Payer: Self-pay | Admitting: Emergency Medicine

## 2022-05-17 ENCOUNTER — Ambulatory Visit (HOSPITAL_COMMUNITY)
Admission: EM | Admit: 2022-05-17 | Discharge: 2022-05-17 | Disposition: A | Discharge status: Home / Self Care | Payer: 59

## 2022-05-17 DIAGNOSIS — J3489 Other specified disorders of nose and nasal sinuses: Secondary | ICD-10-CM

## 2022-05-17 MED ORDER — CEPHALEXIN 500 MG PO CAPS: 500.0000 mg | ORAL_CAPSULE | Freq: Three times a day (TID) | ORAL | 0 refills | Status: DC

## 2022-05-17 MED ORDER — MUPIROCIN 2 % EX OINT: 1.0000 | TOPICAL_OINTMENT | Freq: Two times a day (BID) | CUTANEOUS | 0 refills | Status: AC

## 2022-05-17 MED ORDER — SULFAMETHOXAZOLE-TRIMETHOPRIM 800-160 MG PO TABS: 1.0000 | ORAL_TABLET | Freq: Two times a day (BID) | ORAL | 0 refills | Status: AC

## 2022-05-17 NOTE — ED Triage Notes (Signed)
Pt reports had bump on inside of right nostril that has been intermittent but over past week or so has been constant. Reports pus drainage and pain.  Reports had throat cancer last year.

## 2022-05-17 NOTE — ED Provider Notes (Signed)
[facility]    CSN: [id number] Arrival date & time: 05/17/22  4481      History   Chief Complaint Chief Complaint  Patient presents with   Wound Check    Entered by patient    HPI Neamiah Sciarra is a 51 y.o. male.   Patient presents today with a prolonged history of lesion in his right nare that has become swollen, painful, draining in the past week.  Reports that intermittently over the past several years this has become swollen and painful but usually he is able to drain it and apply some topical antibiotic ointment with resolution of symptoms.  He has continued this process at this time continues to have significant pain and drainage.  He has been using over-the-counter triple antibiotic ointment.  Has not tried any additional medications for symptom management.  He does have a history of oropharyngeal cancer but has been in remission for 1 year and is not currently undergoing any treatment.  He is followed by ENT and they have evaluated this in the past but not when it was actively infected/inflamed.  Reports that a few months ago he was treated with Augmentin.  Denies additional antibiotic use since that time.    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   Dehydration 01/29/2021   ED (erectile dysfunction)    Mucositis due to antineoplastic therapy 03/31/2021   Sleep apnea    Tonsillar mass 11/16/2020   Weight loss, unintentional 01/04/2021    Patient Active Problem List   Diagnosis Date Noted   Benign localized prostatic hyperplasia with lower urinary tract symptoms (LUTS) 02/06/2022   Chronic low back pain 11/13/2021   Hand dermatitis 11/13/2021   Hypothyroidism 11/13/2021   Melanocytic nevi, unspecified 11/13/2021   Psoriasis 11/13/2021   Seborrheic keratosis 11/13/2021   Sore throat 11/13/2021   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 11/13/2021   Oropharyngeal cancer ([place])  09/29/2021   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   History of head and neck radiation 06/24/2021   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   Pancytopenia ([place]) 01/29/2021   GERD (gastroesophageal reflux disease) 01/29/2021   Tinnitus 01/04/2021   Cancer of tonsillar fossa ([place]) 12/07/2020   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   Neoplasm related pain 12/06/2020   Impotence due to erectile dysfunction 08/30/2012   Erectile disorder 12/10/2009    Past Surgical History:  Procedure Laterality Date   DIRECT LARYNGOSCOPY N/A 11/22/2020   Procedure: DIRECT LARYNGOSCOPY WITH BIOPSY;  Surgeon: Leta Baptist, MD;  Location: [facility];  Service: ENT;  Laterality: N/A;   FOOT SURGERY  2000   extra nivicular removal   IR GASTROSTOMY TUBE MOD SED  12/24/2020   IR GASTROSTOMY TUBE REMOVAL  04/11/2021   IR IMAGING GUIDED PORT INSERTION  12/24/2020   IR REMOVAL TUN ACCESS W/ PORT W/O FL MOD SED  08/25/2021   SPINE SURGERY     TONSILLECTOMY Left 11/22/2020   Procedure: LEFT TONSILLECTOMY;  Surgeon: Leta Baptist, MD;  Location: [facility];  Service: ENT;  Laterality: Left;       Home Medications    Prior to Admission medications   Medication Sig Start Date End Date Taking? Authorizing Provider  cephALEXin (KEFLEX) 500 MG capsule Take 1 capsule (500 mg total) by mouth 3 (three) times daily. 05/17/22  Yes Luisa Louk K, PA-C  gabapentin (NEURONTIN) 300 MG capsule Take 300  mg by mouth in the morning, at noon, and at bedtime. 01/27/22  Yes [provider]  mupirocin ointment (BACTROBAN) 2 % Apply 1 Application topically 2 (two) times daily. 05/17/22  Yes Annalyn Blecher K, PA-C  sulfamethoxazole-trimethoprim (BACTRIM DS) 800-160 MG tablet Take 1 tablet by mouth 2 (two) times daily for 7 days. 05/17/22 05/24/22 Yes Tashana Haberl, Derry Skill, PA-C  Cholecalciferol (VITAMIN D3) 1.25 MG (50000 UT) CAPS Take 1 capsule by mouth once a week. 11/25/21   [provider]   levothyroxine (SYNTHROID) 112 MCG tablet Take 1 tablet (112 mcg total) by mouth daily. 02/10/22   Orma Render, NP  meloxicam (MOBIC) 15 MG tablet TAKE ONE TABLET BY MOUTH DAILY AS NEEDED PAIN 10/21/21   [provider]  tadalafil (CIALIS) 5 MG tablet Take 1 tablet (5 mg total) by mouth daily. 02/06/22   Orma Render, NP  fluticasone (FLONASE) 50 MCG/ACT nasal spray Place 2 sprays into both nostrils daily. 06/06/13 11/11/20  Roselee Culver, MD  sildenafil (VIAGRA) 100 MG tablet Take 1 tablet (100 mg total) by mouth daily as needed. 08/30/12 11/11/20  Mancel Bale, PA-C    Family History History reviewed. No pertinent family history.  Social History Social History   Tobacco Use   Smoking status: Never   Smokeless tobacco: Never  Vaping Use   Vaping Use: Never used  Substance Use Topics   Alcohol use: Not Currently    Comment: rare   Drug use: Never     Allergies   Patient has no known allergies.   Review of Systems Review of Systems  Constitutional:  Negative for activity change, appetite change, fatigue and fever.  HENT:  Negative for congestion, rhinorrhea, sinus pressure, sneezing and sore throat.   Respiratory:  Negative for cough and shortness of breath.   Cardiovascular:  Negative for chest pain.  Gastrointestinal:  Negative for abdominal pain, diarrhea, nausea and vomiting.  Neurological:  Negative for dizziness, light-headedness and headaches.     Physical Exam Triage Vital Signs ED Triage Vitals  Enc Vitals Group     BP 05/17/22 1055 115/85     Pulse Rate 05/17/22 1055 72     Resp 05/17/22 1055 17     Temp 05/17/22 1055 98.9 F (37.2 C)     Temp Source 05/17/22 1055 Oral     SpO2 05/17/22 1055 100 %     Weight --      Height --      Head Circumference --      Peak Flow --      Pain Score 05/17/22 1053 7     Pain Loc --      Pain Edu? --      Excl. in [place]? --    No data found.  Updated Vital Signs BP 115/85 (BP Location: Left Arm)    Pulse 72   Temp 98.9 F (37.2 C) (Oral)   Resp 17   SpO2 100%   Visual Acuity Right Eye Distance:   Left Eye Distance:   Bilateral Distance:    Right Eye Near:   Left Eye Near:    Bilateral Near:     Physical Exam Vitals reviewed.  Constitutional:      General: He is awake.     Appearance: Normal appearance. He is well-developed. He is not ill-appearing.     Comments: Very pleasant male appears stated age in no acute distress sitting comfortably in exam room  HENT:  Head: Normocephalic and atraumatic.     Right Ear: Tympanic membrane, ear canal and external ear normal. Tympanic membrane is not erythematous or bulging.     Left Ear: Tympanic membrane, ear canal and external ear normal. Tympanic membrane is not erythematous or bulging.     Nose: Nose normal.     Right Nostril: Occlusion present. No foreign body, epistaxis or septal hematoma.     Left Nostril: No foreign body, epistaxis, septal hematoma or occlusion.     Comments: A large flesh-colored papule noted at septal portion of vestibule.  Palpation of this by patient results and expression of purulent drainage.  No septal hematoma noted.    Mouth/Throat:     Pharynx: No oropharyngeal exudate or posterior oropharyngeal erythema.     Comments: Uvula absent Cardiovascular:     Rate and Rhythm: Normal rate and regular rhythm.     Heart sounds: Normal heart sounds, S1 normal and S2 normal. No murmur heard. Pulmonary:     Effort: Pulmonary effort is normal. No accessory muscle usage or respiratory distress.     Breath sounds: Normal breath sounds. No stridor. No wheezing, rhonchi or rales.  Neurological:     Mental Status: He is alert.  Psychiatric:        Behavior: Behavior is cooperative.      UC Treatments / Results  Labs (all labs ordered are listed, but only abnormal results are displayed) Labs Reviewed - No data to display  EKG   Radiology No results found.  Procedures Procedures (including critical  care time)  Medications Ordered in UC Medications - No data to display  Initial Impression / Assessment and Plan / UC Course  I have reviewed the triage vital signs and the nursing notes.  Pertinent labs & imaging results that were available during my care of the patient were reviewed by me and considered in my medical decision making (see chart for details).     Patient is well-appearing, afebrile, nontoxic, nontachycardic.  We will start cephalexin and Bactrim DS to cover for infection given recent antibiotics and concern for MRSA.  He was also started on Bactroban with instruction to use this twice daily for minimum of 2 weeks.  He is established with ENT and was strongly encouraged to schedule an appointment as soon as possible for further evaluation and management.  Discussed that if his symptoms are improving quickly with antibiotics he should go to the emergency room.  If he has any worsening symptoms he needs to be seen immediately including fever, nausea, vomiting, enlarging lesion, increased pain.  Offered work excuse note but patient declined.  Final Clinical Impressions(s) / UC Diagnoses   Final diagnoses:  Infection of nose     Discharge Instructions      We are treating you for an infection.  Please take cephalexin and sulfamethoxazole-trimethoprim as prescribed.  Continue with nasal saline as recommended by your ENT.  Use Bactroban ointment in the nares twice daily.  Please follow-up as soon as possible with your ENT.  If you develop any worsening symptoms including enlarging lesion, fever, nausea, vomiting you should be seen immediately.     ED Prescriptions     Medication Sig Dispense Auth. Provider   cephALEXin (KEFLEX) 500 MG capsule Take 1 capsule (500 mg total) by mouth 3 (three) times daily. 21 capsule Leveta Wahab K, PA-C   sulfamethoxazole-trimethoprim (BACTRIM DS) 800-160 MG tablet Take 1 tablet by mouth 2 (two) times daily for 7 days. 14 tablet  Naif Alabi  K, PA-C   mupirocin ointment (BACTROBAN) 2 % Apply 1 Application topically 2 (two) times daily. 22 g Erendira Crabtree K, PA-C      PDMP not reviewed this encounter.   Terrilee Croak, PA-C 05/17/22 1151

## 2022-05-17 NOTE — Discharge Instructions (Addendum)
We are treating you for an infection.  Please take cephalexin and sulfamethoxazole-trimethoprim as prescribed.  Continue with nasal saline as recommended by your ENT.  Use Bactroban ointment in the nares twice daily.  Please follow-up as soon as possible with your ENT.  If you develop any worsening symptoms including enlarging lesion, fever, nausea, vomiting you should be seen immediately.

## 2022-06-23 ENCOUNTER — Encounter: Payer: Self-pay | Admitting: Nurse Practitioner

## 2022-06-26 ENCOUNTER — Encounter: Payer: Self-pay | Admitting: Nurse Practitioner

## 2022-06-26 ENCOUNTER — Ambulatory Visit: Payer: 59 | Admitting: Nurse Practitioner

## 2022-06-26 VITALS — BP 122/80 | HR 70 | Temp 98.3°F | Wt 212.2 lb

## 2022-06-26 DIAGNOSIS — M25522 Pain in left elbow: Secondary | ICD-10-CM | POA: Diagnosis not present

## 2022-06-26 DIAGNOSIS — E039 Hypothyroidism, unspecified: Secondary | ICD-10-CM | POA: Diagnosis not present

## 2022-06-26 DIAGNOSIS — E291 Testicular hypofunction: Secondary | ICD-10-CM

## 2022-06-26 MED ORDER — LEVOTHYROXINE SODIUM 112 MCG PO TABS: 112.0000 ug | ORAL_TABLET | Freq: Every day | ORAL | 3 refills | Status: DC

## 2022-06-26 NOTE — Patient Instructions (Addendum)
I have sent your thyroid medication to the pharmacy. I will let you know what your labs show.   I will let you know what your testosterone levels show.

## 2022-06-28 NOTE — Progress Notes (Signed)
Gerald Keeler, DNP, AGNP-c [facility]  [address] [place],  [address] [place] and/or Follow-Up Visit  Blood pressure 122/80, pulse 70, temperature 98.3 F (36.8 C), weight 212 lb 3.2 oz (96.3 kg).    Gerald [name] is a 51 y.o. year old male presenting today for evaluation and management of the following: Hypothyroid He endorses taking his medication as directed without any missed doses.  He reports that overall he feels pretty good but he has had some increased fatigue recently.  He is not sure if this is thyroid related or possibly related to hormone deficiency.  He does have a friend who discussed a decrease in hormone levels because to make the symptoms he is experiencing.  He would like to know if we could check for that today.  Elbow pain He endorses left elbow pain that comes and goes intermittently for the past couple of years.  Pain is primarily at the noted at the lateral epicondyle and radiates down into the forearm.  He does do frequent work with his hands and is not sure if this is related.  All ROS negative with exception of what is listed above.   PHYSICAL EXAM Physical Exam Vitals and nursing note reviewed.  Constitutional:      Appearance: Normal appearance.  HENT:     Head: Normocephalic.  Eyes:     Extraocular Movements: Extraocular movements intact.     Pupils: Pupils are equal, round, and reactive to light.  Neck:     Vascular: No carotid bruit.  Cardiovascular:     Rate and Rhythm: Normal rate and regular rhythm.     Pulses: Normal pulses.     Heart sounds: Normal heart sounds.  Pulmonary:     Effort: Pulmonary effort is normal.     Breath sounds: Normal breath sounds.  Abdominal:     General: Bowel sounds are normal. There is no distension.     Palpations: Abdomen is soft.     Tenderness: There is no abdominal tenderness. There is no guarding.  Musculoskeletal:     Right elbow:  Normal.     Left elbow: Decreased range of motion. Tenderness present in lateral epicondyle.     Cervical back: Normal range of motion.     Right lower leg: No edema.     Left lower leg: No edema.  Lymphadenopathy:     Cervical: No cervical adenopathy.  Skin:    General: Skin is warm and dry.     Capillary Refill: Capillary refill takes less than 2 seconds.  Neurological:     General: No focal deficit present.     Mental Status: He is alert and oriented to person, place, and time.  Psychiatric:        Mood and Affect: Mood normal.        Behavior: Behavior normal.        Thought Content: Thought content normal.        Judgment: Judgment normal.     PLAN Problem List Items Addressed This Visit     Hypothyroidism - Primary    Chronic. Last thyroid check normal, but given increased fatigue we will plan to recheck labs in the near future. We will also plan to check testosterone levels. We will plan to have labs drawn Emari Demmer in the day for testosterone accuracy. He will come back next week to have this done. Refills provided today.       Relevant Medications  levothyroxine (SYNTHROID) 112 MCG tablet   Other Relevant Orders   T4, free   TSH   Testosterone deficiency in male    History of deficiency with implantation of testosterone pellets at other provider. Last check showed levels were on the low end of acceptable. We will plan to recheck these in the morning sometime in the near future so we can see if there is a change since having the pellets for a while. We may need to consider testosterone injections if his levels remain low despite pellets. Will follow .      Relevant Orders   Testosterone, Free, Total, SHBG   Left elbow pain    Symptoms consistent with tendonitis of the lateral epicondyle. Likely repeated use has triggered the inflammation and pain. Recommend NSAIDs, ice, heat, and rest. We can consider referral for steroid injection, if he would like. He may also consider  trying a brace to help stabilize the insertion site of the tendon as this can be helpful. He will let me know if he needs a referral.       Return for 3 weeks labs.   Gerald Keeler, DNP, AGNP-c 06/26/2022 10:44 AM

## 2022-06-28 NOTE — Assessment & Plan Note (Addendum)
Chronic. Last thyroid check normal, but given increased fatigue we will plan to recheck labs in the near future. We will also plan to check testosterone levels. We will plan to have labs drawn Gerald [name] in the day for testosterone accuracy. He will come back next week to have this done. Refills provided today.

## 2022-07-12 DIAGNOSIS — M25522 Pain in left elbow: Secondary | ICD-10-CM | POA: Insufficient documentation

## 2022-07-12 DIAGNOSIS — E291 Testicular hypofunction: Secondary | ICD-10-CM | POA: Insufficient documentation

## 2022-07-12 HISTORY — DX: Pain in left elbow: M25.522

## 2022-07-12 NOTE — Assessment & Plan Note (Addendum)
Symptoms consistent with tendonitis of the lateral epicondyle. Likely repeated use has triggered the inflammation and pain. Recommend NSAIDs, ice, heat, and rest. We can consider referral for steroid injection, if he would like. He may also consider trying a brace to help stabilize the insertion site of the tendon as this can be helpful. He will let me know if he needs a referral.

## 2022-07-12 NOTE — Assessment & Plan Note (Signed)
History of deficiency with implantation of testosterone pellets at other provider. Last check showed levels were on the low end of acceptable. We will plan to recheck these in the morning sometime in the near future so we can see if there is a change since having the pellets for a while. We may need to consider testosterone injections if his levels remain low despite pellets. Will follow .

## 2022-07-19 ENCOUNTER — Other Ambulatory Visit: Payer: Self-pay | Admitting: Otolaryngology

## 2022-07-19 ENCOUNTER — Other Ambulatory Visit: Payer: 59

## 2022-07-19 DIAGNOSIS — Z923 Personal history of irradiation: Secondary | ICD-10-CM

## 2022-07-19 DIAGNOSIS — C109 Malignant neoplasm of oropharynx, unspecified: Secondary | ICD-10-CM

## 2022-07-22 ENCOUNTER — Encounter: Payer: Self-pay | Admitting: Nurse Practitioner

## 2022-07-28 LAB — TSH: TSH: 0.271 u[IU]/mL — ABNORMAL LOW (ref 0.450–4.500)

## 2022-07-28 LAB — T4, FREE: Free T4: 1.38 ng/dL (ref 0.82–1.77)

## 2022-07-28 LAB — TESTOSTERONE, FREE, TOTAL, SHBG
Sex Hormone Binding: 43.1 nmol/L (ref 16.5–55.9)
Testosterone, Free: 5.6 pg/mL — ABNORMAL LOW (ref 6.8–21.5)
Testosterone: 665 ng/dL (ref 264–916)

## 2022-08-01 ENCOUNTER — Ambulatory Visit
Admission: RE | Admit: 2022-08-01 | Discharge: 2022-08-01 | Disposition: A | Discharge status: Home / Self Care | Payer: 59 | Source: Ambulatory Visit | Attending: Otolaryngology | Admitting: Otolaryngology

## 2022-08-01 DIAGNOSIS — C109 Malignant neoplasm of oropharynx, unspecified: Secondary | ICD-10-CM

## 2022-08-01 DIAGNOSIS — Z923 Personal history of irradiation: Secondary | ICD-10-CM

## 2022-08-01 MED ORDER — IOPAMIDOL (ISOVUE-300) INJECTION 61%
75.0000 mL | Freq: Once | INTRAVENOUS | Status: AC | PRN
  Administered 2022-08-01: 75 mL via INTRAVENOUS

## 2022-08-02 ENCOUNTER — Telehealth: Payer: Self-pay | Admitting: Nurse Practitioner

## 2022-08-02 NOTE — Telephone Encounter (Signed)
Gerald [name] called and is wondering if he needs to come back in for testosterone therapy or is he supposed to be waiting on a call from you about it?

## 2022-08-07 ENCOUNTER — Ambulatory Visit (HOSPITAL_BASED_OUTPATIENT_CLINIC_OR_DEPARTMENT_OTHER): Payer: 59 | Admitting: Nurse Practitioner

## 2022-08-07 ENCOUNTER — Encounter: Payer: Self-pay | Admitting: Nurse Practitioner

## 2022-08-07 DIAGNOSIS — E291 Testicular hypofunction: Secondary | ICD-10-CM

## 2022-08-08 MED ORDER — TESTOSTERONE CYPIONATE 100 MG/ML IM SOLN: 100.0000 mg | INTRAMUSCULAR | 1 refills | Status: DC

## 2022-08-09 NOTE — Telephone Encounter (Signed)
Yes, sir. I messaged him through MyChart, but did not go over exact dosing. I appreciate you calling him. He will likely need a PA on the medication so it may take a couple of days for that to go through.

## 2022-08-12 ENCOUNTER — Telehealth: Payer: Self-pay | Admitting: Nurse Practitioner

## 2022-08-12 NOTE — Telephone Encounter (Signed)
P.A. TESTOSTERONE CYPIONATE

## 2022-08-15 NOTE — Telephone Encounter (Signed)
PA approved.

## 2022-08-18 ENCOUNTER — Other Ambulatory Visit: Payer: 59

## 2022-10-07 NOTE — Telephone Encounter (Signed)
done

## 2022-11-06 ENCOUNTER — Ambulatory Visit: Payer: 59 | Admitting: Nurse Practitioner

## 2022-11-06 ENCOUNTER — Encounter: Payer: Self-pay | Admitting: Nurse Practitioner

## 2022-11-06 VITALS — BP 116/72 | HR 80 | Wt 208.0 lb

## 2022-11-06 DIAGNOSIS — E291 Testicular hypofunction: Secondary | ICD-10-CM | POA: Diagnosis not present

## 2022-11-06 DIAGNOSIS — E559 Vitamin D deficiency, unspecified: Secondary | ICD-10-CM

## 2022-11-06 MED ORDER — TESTOSTERONE CYPIONATE 100 MG/ML IM SOLN: INTRAMUSCULAR | 1 refills | Status: DC

## 2022-11-06 MED ORDER — VITAMIN D3 1.25 MG (50000 UT) PO TABS: 1.0000 | ORAL_TABLET | ORAL | 1 refills | Status: DC

## 2022-11-06 NOTE — Progress Notes (Signed)
  Tollie Eth, DNP, AGNP-c [facility] [address] [place], [place] [address] [phone]  Subjective:   Gerald [name] is a 52 y.o. male presents to day for evaluation of:  Testosterone  PMH, Medications, and Allergies reviewed and updated in chart as appropriate.   ROS negative except for what is listed in HPI. Objective:  BP 116/72   Pulse 80   Wt 208 lb (94.3 kg)   BMI 30.72 kg/m  Physical Exam        Assessment & Plan:   Problem List Items Addressed This Visit   None     Tollie Eth, DNP, AGNP-c 11/06/2022  11:48 AM    History, Medications, Surgery, SDOH, and Family History reviewed and updated as appropriate.

## 2022-11-06 NOTE — Patient Instructions (Signed)
I have sent in Vitamin D for you to start. This is once a week dosing so you can take it on the same day as the semaglutide and testosterone if that is easy for you.   Increase the testosterone to 125mg  once a week and see how you feel on this. Let me know if you have any issues with this dose.

## 2022-11-08 NOTE — Assessment & Plan Note (Signed)
Discussion of testosterone replacement therapy today with significant concerns centered around erectile dysfunction. Review of recent labs shows that he is in the normal range, but does still have room for improvement without exceeding the limits. We discussed a slow titration to see if we can achieve improvement of symptoms without exceeding the blood levels. Review of his blood pressure shows excellent control today.  Plan: - increase testosterone dosing to  weekly.  - repeat labs in 4-8 weeks

## 2022-11-21 ENCOUNTER — Telehealth: Payer: Self-pay

## 2022-11-21 DIAGNOSIS — E291 Testicular hypofunction: Secondary | ICD-10-CM

## 2022-11-21 NOTE — Telephone Encounter (Signed)
Pt called to advise the pharmacy did not get the prescription for testosterone. It looks like the vit d sent on the 15th but the testosterone didn't go through.

## 2022-11-23 MED ORDER — TESTOSTERONE CYPIONATE 100 MG/ML IM SOLN: INTRAMUSCULAR | 3 refills | Status: DC

## 2022-11-23 NOTE — Telephone Encounter (Signed)
Medication resent

## 2022-12-04 ENCOUNTER — Telehealth: Payer: Self-pay

## 2022-12-04 DIAGNOSIS — E291 Testicular hypofunction: Secondary | ICD-10-CM

## 2022-12-04 NOTE — Telephone Encounter (Signed)
Pt called and left vm stating the pharmacy told him they weren't able to fill the testosterone prescription because of the way it was written. I called and spoke with the pharmacy and they advised they never received the rx at all. I'm not sure what is going on with the pharmacy. I called the pt and he asked if he could get a paper rx written the same as it was written on his last refill. If he is not able to receive a paper rx, he would like the current one to be sent in written the same as it was written previously as opposed to the current "200mg  weekly based on labs".

## 2022-12-06 MED ORDER — TESTOSTERONE CYPIONATE 100 MG/ML IM SOLN: 200.0000 mg | INTRAMUSCULAR | 3 refills | Status: DC

## 2022-12-06 NOTE — Telephone Encounter (Signed)
Prescription changed

## 2022-12-11 ENCOUNTER — Telehealth: Payer: Self-pay

## 2022-12-11 NOTE — Telephone Encounter (Signed)
Pharmacy called wanting to know if you change the pts. Testosterone to the 200mg /ml testosterone injection since he needs to take 200mg  weekly.

## 2022-12-14 ENCOUNTER — Other Ambulatory Visit: Payer: Self-pay

## 2022-12-14 DIAGNOSIS — E291 Testicular hypofunction: Secondary | ICD-10-CM

## 2022-12-14 MED ORDER — TESTOSTERONE CYPIONATE 200 MG/ML IM SOLN: 200.0000 mg | INTRAMUSCULAR | 0 refills | Status: DC

## 2022-12-16 ENCOUNTER — Other Ambulatory Visit: Payer: Self-pay | Admitting: Nurse Practitioner

## 2022-12-16 DIAGNOSIS — E291 Testicular hypofunction: Secondary | ICD-10-CM

## 2022-12-19 ENCOUNTER — Telehealth: Payer: Self-pay | Admitting: Nurse Practitioner

## 2022-12-19 NOTE — Telephone Encounter (Signed)
Pt came in the office and needs his testosterone 200 mg/ml sent to  [id number] - [place], [place] - [address] AT [facility] [address] [facility] [address]  He was upset because he says he has been trying to get in for about a month now and it was sent in incorrectly and then it was made as a printed prescription. I told him we would ix this for him today

## 2022-12-20 NOTE — Telephone Encounter (Signed)
Pharmacy contacted by Madelaine Bhat and orders were clarified.

## 2022-12-22 ENCOUNTER — Telehealth: Payer: Self-pay | Admitting: Nurse Practitioner

## 2022-12-22 NOTE — Telephone Encounter (Signed)
Called pharmacy went thru & called pt informed

## 2022-12-22 NOTE — Telephone Encounter (Signed)
PA Case ID #: [id number] Rx #: [id number] Need Help? Call us at [phone] Outcome Approved today CaseId:[id number];Status:Approved;Review Type:Prior Auth;Coverage Start Date:11/22/2022;Coverage End Date:12/22/2023; Authorization Expiration Date: 12/21/2023 Drug Testosterone Cypionate 200MG /ML intramuscular solution

## 2022-12-29 IMAGING — PT NM PET TUM IMG INITIAL (PI) SKULL BASE T - THIGH
5 series · 25 of 25 positions shown · non-contrast
Comparison: Neck CT 11/09/2020

CLINICAL DATA: Initial treatment strategy for head neck carcinoma.
LEFT tonsil carcinoma.

EXAM:
NUCLEAR MEDICINE PET SKULL BASE TO THIGH
TECHNIQUE: 12.3 mCi F-18 FDG was injected intravenously. Full-ring PET imaging
was performed from the skull base to thigh after the radiotracer. CT
data was obtained and used for attenuation correction and anatomic
localization.
Fasting blood glucose: 90 mg/dl

[Series 3: pet hn_sk_thigh ac · axial · 5.0mm · 4.07mm/px · z∈[-1403,-415]mm · 10 of 248 slices shown]
[im 1/248]
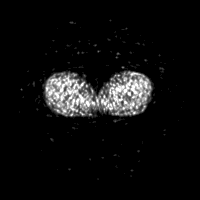
[im 28/248]
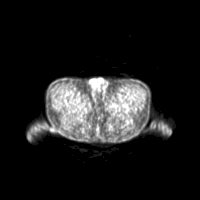
[im 55/248]
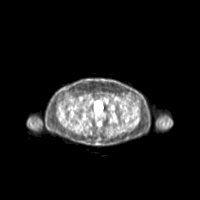
[im 83/248]
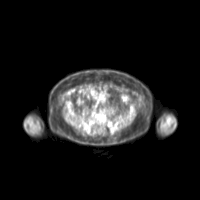
[im 110/248]
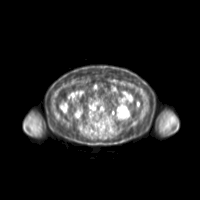
[im 138/248]
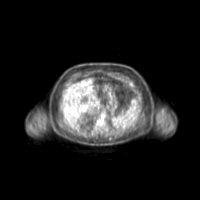
[im 165/248]
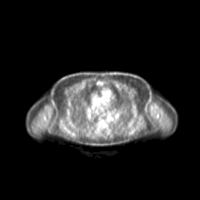
[im 193/248]
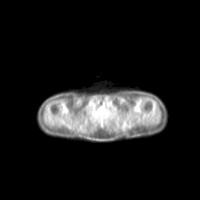
[im 220/248]
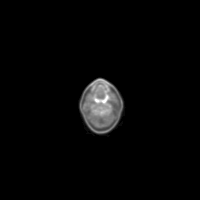
[im 248/248]
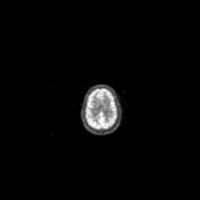

[Series 5: pet hn_sk_thigh nac · axial · 5.0mm · 4.07mm/px · z∈[-1403,-415]mm · 9 of 248 slices shown]
[im 1/248]
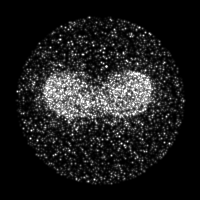
[im 31/248]
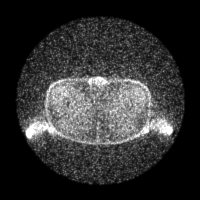
[im 62/248]
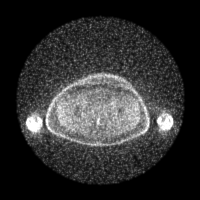
[im 93/248]
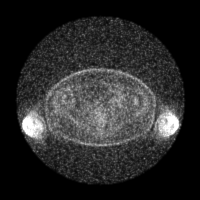
[im 124/248]
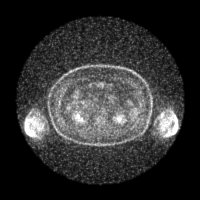
[im 155/248]
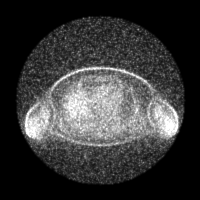
[im 186/248]
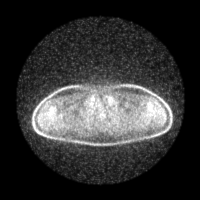
[im 217/248]
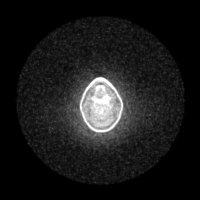
[im 248/248]
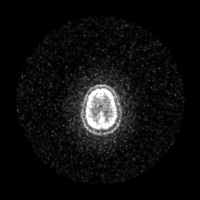

[Series 8: ct hn_sk_th 5.0 br59 (id)_bone · axial · 5.0mm · 0.74mm/px · z∈[-871,-607]mm · 3 of 67 slices shown]
[im 1/67]
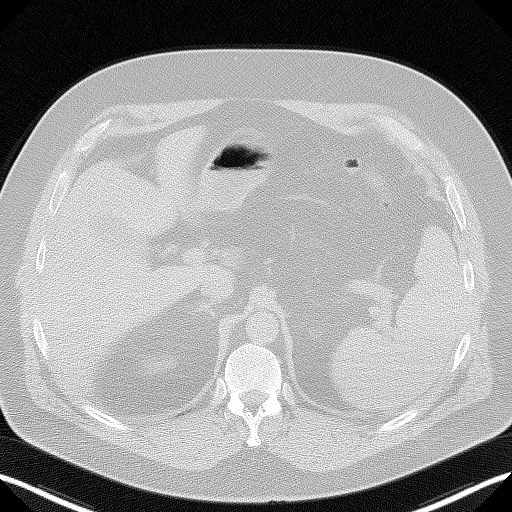
[im 34/67]
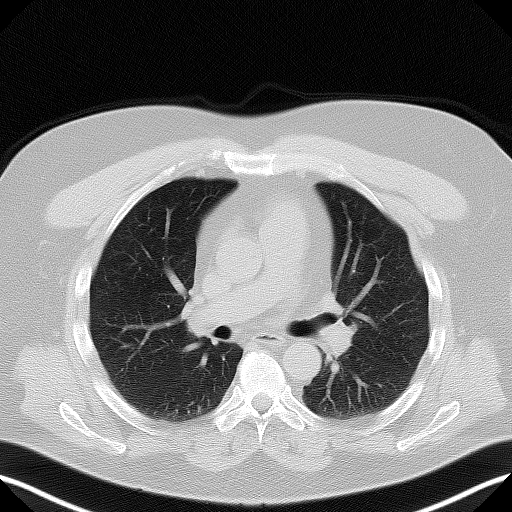
[im 67/67  brain]
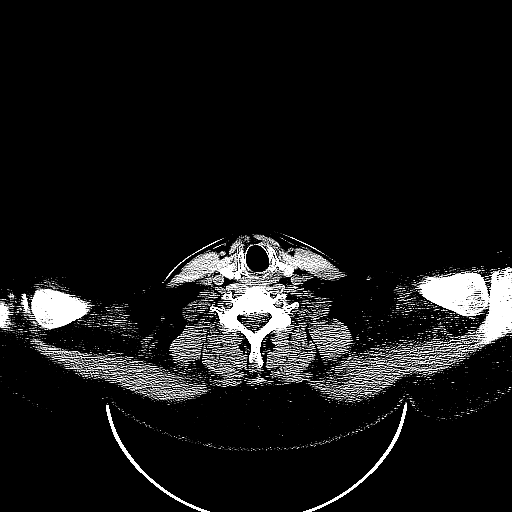

[Series 603: fused cor · 2 of 63 slices shown]
[im 1/63]
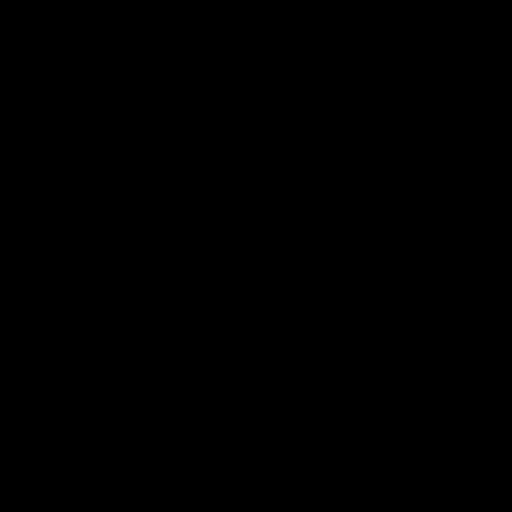
[im 63/63]
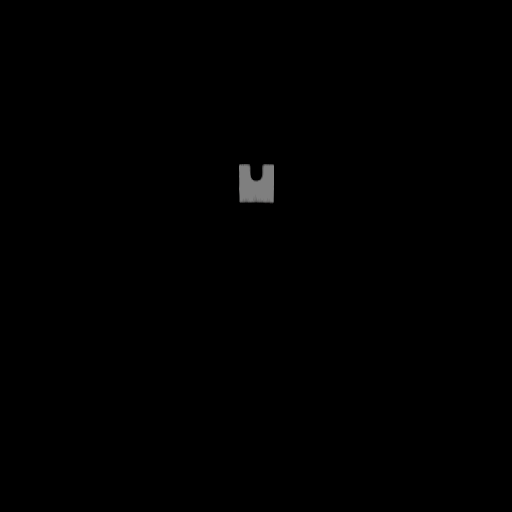

[Series 604: <mip collection> · coronal · 2.05mm/px · 1 of 32 slices shown]
[im 1/32]
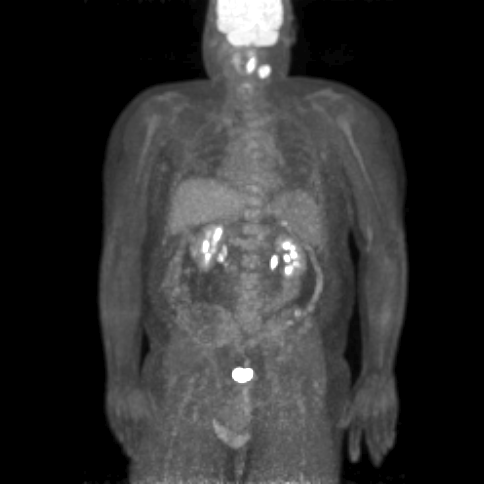

[25 of 25 positions shown; findings below may reference images not displayed]

FINDINGS: Mediastinal blood pool activity: SUV max

Liver activity: SUV max NA

NECK: LEFT tonsillar fossa on a there is a rim of intense
hypermetabolic activity with SUV max equal 16.5 (image 34) activity
appears confined to the mucosal/submucosal surface.

Enlarged intensely hypermetabolic ipsilateral level 2 lymph node
measures 15 mm short axis (image 37) with SUV max equal 13.9.

No additional hypermetabolic nodes are present along the cervical
chains.

Incidental CT findings: none

CHEST: No hypermetabolic mediastinal lymph nodes. No suspicious
pulmonary nodules.

Incidental CT findings: none

ABDOMEN/PELVIS: No abnormal hypermetabolic activity within the
liver, pancreas, adrenal glands, or spleen. No hypermetabolic lymph
nodes in the abdomen or pelvis.

Incidental CT findings: none

SKELETON: No focal hypermetabolic activity to suggest skeletal
metastasis.

Incidental CT findings: none
IMPRESSION: 1. Hypermetabolic lesion in the LEFT tonsillar fossa corresponds to
abnormal findings on comparison neck CT. Lesion appears confined to
the mucosal/submucosal tissue.
2. Hypermetabolic ipsilateral metastatic level II lymph node.
3. No evidence of contralateral adenopathy.
4. No evidence of distant metastatic disease.

## 2023-01-18 ENCOUNTER — Ambulatory Visit: Payer: 59 | Admitting: Nurse Practitioner

## 2023-01-18 ENCOUNTER — Encounter: Payer: Self-pay | Admitting: Nurse Practitioner

## 2023-01-18 VITALS — BP 124/80 | HR 86 | Wt 210.6 lb

## 2023-01-18 DIAGNOSIS — E782 Mixed hyperlipidemia: Secondary | ICD-10-CM

## 2023-01-18 DIAGNOSIS — E781 Pure hyperglyceridemia: Secondary | ICD-10-CM | POA: Diagnosis not present

## 2023-01-18 DIAGNOSIS — L2082 Flexural eczema: Secondary | ICD-10-CM

## 2023-01-18 DIAGNOSIS — Z6834 Body mass index (BMI) 34.0-34.9, adult: Secondary | ICD-10-CM

## 2023-01-18 DIAGNOSIS — E6609 Other obesity due to excess calories: Secondary | ICD-10-CM | POA: Diagnosis not present

## 2023-01-18 DIAGNOSIS — E291 Testicular hypofunction: Secondary | ICD-10-CM

## 2023-01-18 HISTORY — DX: Pure hyperglyceridemia: E78.1

## 2023-01-18 MED ORDER — ZEPBOUND 5 MG/0.5ML ~~LOC~~ SOAJ: 5.0000 mg | SUBCUTANEOUS | 0 refills | Status: DC

## 2023-01-18 MED ORDER — TRIAMCINOLONE ACETONIDE 0.5 % EX OINT: 1.0000 | TOPICAL_OINTMENT | Freq: Two times a day (BID) | CUTANEOUS | 0 refills | Status: DC

## 2023-01-18 MED ORDER — TRIAMCINOLONE ACETONIDE 0.5 % EX OINT: 1.0000 | TOPICAL_OINTMENT | Freq: Two times a day (BID) | CUTANEOUS | 6 refills | Status: AC

## 2023-01-18 NOTE — Patient Instructions (Addendum)
WEIGHT LOSS PLANNING Your progress today shows:     01/18/2023   10:48 AM 11/06/2022   11:33 AM 06/26/2022   10:42 AM  Vitals with BMI  Weight 210 lbs 10 oz 208 lbs 212 lbs 3 oz  Systolic 124 116 045  Diastolic 80 72 80  Pulse 86 80 70    For best management of weight, it is vital to balance intake versus output. This means the number of calories burned per day must be less than the calories you take in with food and drink.   I recommend trying to follow a diet with the following: Calories: 1200-1500 calories per day Carbohydrates: 150-180 grams of carbohydrates per day  Why: Gives your body enough "quick fuel" for cells to maintain normal function without sending them into starvation mode.  Protein: At least 90 grams of protein per day- 30 grams with each meal Why: Protein takes longer and uses more energy than carbohydrates to break down for fuel. The carbohydrates in your meals serves as quick energy sources and proteins help use some of that extra quick energy to break down to produce long term energy. This helps you not feel hungry as quickly and protein breakdown burns calories.  Water: Drink AT LEAST 64 ounces of water per day  Why: Water is essential to healthy metabolism. Water helps to fill the stomach and keep you fuller longer. Water is required for healthy digestion and filtering of waste in the body.  Fat: Limit fats in your diet- when choosing fats, choose foods with lower fats content such as lean meats (chicken, fish, [place]).  Why: Increased fat intake leads to storage "for later". Once you burn your carbohydrate energy, your body goes into fat and protein breakdown mode to help you loose weight.  Cholesterol: Fats and oils that are LIQUID at room temperature are best. Choose vegetable oils (olive oil, avocado oil, nuts). Avoid fats that are SOLID at room temperature (animal fats, processed meats). Healthy fats are often found in whole grains, beans, nuts, seeds, and  berries.  Why: Elevated cholesterol levels lead to build up of cholesterol on the inside of your blood vessels. This will eventually cause the blood vessels to become hard and can lead to high blood pressure and damage to your organs. When the blood flow is reduced, but the pressure is high from cholesterol buildup, parts of the cholesterol can break off and form clots that can go to the brain or heart leading to a stroke or heart attack.  Fiber: Increase amount of SOLUBLE the fiber in your diet. This helps to fill you up, lowers cholesterol, and helps with digestion. Some foods high in soluble fiber are oats, peas, beans, apples, carrots, barley, and citrus fruits.   Why: Fiber fills you up, helps remove excess cholesterol, and aids in healthy digestion which are all very important in weight management.   I recommend the following as a minimum activity routine: Purposeful walk or other physical activity at least 20 minutes every single day. This means purposefully taking a walk, jog, bike, swim, treadmill, elliptical, dance, etc.  This activity should be ABOVE your normal daily activities, such as walking at work. Goal exercise should be at least 150 minutes a week- work your way up to this.   Heart Rate: Your maximum exercise heart rate should be 220 - Your Age in Years. When exercising, get your heart rate up, but avoid going over the maximum targeted heart rate.  60-70%  of your maximum heart rate is where you tend to burn the most fat. To find this number:  220 - Age In Years= Max HR  Max HR x 0.6 (or 0.7) = Fat Burning HR The Fat Burning HR is your goal heart rate while working out to burn the most fat.  NEVER exercise to the point your feel lightheaded, weak, nauseated, dizzy. If you experience ANY of these symptoms- STOP exercise! Allow yourself to cool down and your heart rate to come down. Then restart slower next time.  If at ANY TIME you feel chest pain or chest pressure during  exercise, STOP IMMEDIATELY and seek medical attention.     Tirzepatide Injection (Weight Management) What is this medication? TIRZEPATIDE (tir ZEP a tide) promotes weight loss. It may also be used to maintain weight loss.  It works by decreasing appetite. Changes to diet and exercise are often combined with this medication. This medicine may be used for other purposes; ask your health care provider or pharmacist if you have questions. COMMON BRAND NAME(S): Zepbound What should I tell my care team before I take this medication? They need to know if you have any of these conditions: Eye disease caused by diabetes Gallbladder disease History of depression Pancreatic disease Kidney disease Stomach or intestine problems, such as problems digesting food Suicidal thoughts, plans, or attempt by you or a family member Personal or family history of MEN 2, a condition that causes endocrine gland tumors Personal or family history of thyroid cancer An unusual or allergic reaction to tirzepatide, other medications, foods, dyes, or preservatives Pregnant or trying to get pregnant Breastfeeding How should I use this medication? This medication is injected under the skin. You will be taught how to prepare and give it. Take it as directed on the prescription label. Keep taking it unless your care team tells you to stop. It is important that you put your used needles and syringes in a special sharps container. Do not put them in a trash can. If you do not have a sharps container, call your pharmacist or care team to get one. A special MedGuide will be given to you by the pharmacist with each prescription and refill. Be sure to read this information carefully each time. This medication comes with INSTRUCTIONS FOR USE. Ask your pharmacist for directions on how to use this medication. Read the information carefully. Talk to your pharmacist or care team if you have questions. Talk to your care team about the use  of this medication in children. Special care may be needed. Overdosage: If you think you have taken too much of this medicine contact a poison control center or emergency room at once. NOTE: This medicine is only for you. Do not share this medicine with others. What if I miss a dose? If you miss a dose, take it as soon as you can unless it is more than 4 days (96 hours) late. If it is more than 4 days late, skip the missed dose. Take the next dose at the normal time. Do not take 2 doses within 3 days (72 hours) of each other. What may interact with this medication? Certain medications for diabetes, such as insulin, glyburide, glipizide This medication may affect how other medications work. Talk with your care team about all of the medications you take. They may suggest changes to your treatment plan to lower the risk of side effects and to make sure your medications work as intended. This list may not  describe all possible interactions. Give your health care provider a list of all the medicines, herbs, non-prescription drugs, or dietary supplements you use. Also tell them if you smoke, drink alcohol, or use illegal drugs. Some items may interact with your medicine. What should I watch for while using this medication? Visit your care team for regular checks on your progress. It may be some time before you see the benefit from this medication. Check with your care team if you have severe diarrhea, nausea, and vomiting, or if you sweat a lot. The loss of too much body fluid may make it dangerous for you to take this medication. Tell your care team if you are taking medications to treat diabetes, such as insulin or sulfonylureas. This may increase your risk of low blood sugar. Know the symptoms of low blood sugar and how to treat it. Talk to your care team about your risk of cancer. You may be more at risk for certain types of cancer if you take this medication. Estrogen and progestin hormones may not work  as well while you are taking this medication. If you take these as pills by mouth, your care team may recommend another type of contraception for 4 weeks after you start this medication and for 4 weeks after each dose increase. Talk to your care team about contraceptive options. They can help you find the option that works for you. What side effects may I notice from receiving this medication? Side effects that you should report to your care team as soon as possible: Allergic reactions or angioedema--skin rash, itching or hives, swelling of the face, eyes, lips, tongue, arms, or legs, trouble swallowing or breathing Bowel blockage--stomach cramping, unable to have a bowel movement or pass gas, loss of appetite, vomiting Change in vision Dehydration--increased thirst, dry mouth, feeling faint or lightheaded, headache, dark yellow or brown urine Gallbladder problems--severe stomach pain, nausea, vomiting, fever Kidney injury--decrease in the amount of urine, swelling of the ankles, hands, or feet Pancreatitis--severe stomach pain that spreads to your back or gets worse after eating or when touched, fever, nausea, vomiting Thoughts of suicide or self-harm, worsening mood, feelings of depression Thyroid cancer--new mass or lump in the neck, pain or trouble swallowing, trouble breathing, hoarseness Side effects that usually do not require medical attention (report these to your care team if they continue or are bothersome): Diarrhea Loss of appetite Nausea Upset stomach This list may not describe all possible side effects. Call your doctor for medical advice about side effects. You may report side effects to FDA at 1-800-FDA-1088. Where should I keep my medication? Keep out of the reach of children and pets. Store in a refrigerator or at room temperature up to 30 degrees C (86 degrees F). Keep it in the original container. Protect from light. Refrigeration (preferred): Store in the refrigerator. Do  not freeze. Get rid of any unused medication after the expiration date. Room temperature: This medication may be stored at room temperature for up to 21 days. If it is stored at room temperature, get rid of any unused medication after 21 days or after it expires, whichever is first. To get rid of medications that are no longer needed or have expired: Take the medication to a medication take-back program. Check with your pharmacy or law enforcement to find a location. If you cannot return the medication, ask your pharmacist or care team how to get rid of this medication safely. NOTE: This sheet is a summary. It may not  cover all possible information. If you have questions about this medicine, talk to your doctor, pharmacist, or health care provider.  2024 Elsevier/Gold Standard (2022-09-17 00:00:00)

## 2023-01-18 NOTE — Progress Notes (Signed)
Shawna Clamp, DNP, AGNP-c [facility]  [address] [place], [place] [address] [phone]  ESTABLISHED PATIENT- Chronic Health and/or Follow-Up Visit  Blood pressure 124/80, pulse 86, weight 210 lb 9.6 oz (95.5 kg).    Gerald [name] is a 52 y.o. year old male presenting today for evaluation and management of chronic conditions.   Weight Gerald [name] has previously been seen by a weight management clinic and was prescribed semaglutide for management. It sounds as though this was compounded with B12, based on description. He would like to restart GLP-1 for weight management today. He has been working on diet and exercise for more than 3 months and has not had success with management of his BMI. Given his history of cancer, he is concerned for his overall health status and would like to be proactive for protection against weight induced conditions.   He was on the max dose of compounded medication. He has been on this for at least 10 months, but possibly longer. He has noticed that his appetite is suppressed, and he has been working on reducing his meal sizes. The current medication does give him indigestion and if he eats a big meal  He started on the semaglutide at the weight of 240lb. His max weight was 269lbs. Today he is down to 210 lb. He is walking a mile a day and going to the gym three times a week.   He also tells me that he has finally found a dose of testosterone that is working well for him. He would like to continue on this. He is able to achieve and maintain and erection and has no concerns with increased fatigue on this dose.   He also requests an increase in triamcinolone dosage for rash management as the current dose is not effective.    All ROS negative with exception of what is listed above.   PHYSICAL EXAM Physical Exam Vitals and nursing note reviewed.  Constitutional:      Appearance: Normal appearance.  HENT:     Head: Normocephalic.  Eyes:      Pupils: Pupils are equal, round, and reactive to light.  Cardiovascular:     Rate and Rhythm: Normal rate and regular rhythm.     Pulses: Normal pulses.     Heart sounds: Normal heart sounds.  Pulmonary:     Effort: Pulmonary effort is normal.     Breath sounds: Normal breath sounds.  Musculoskeletal:        General: Normal range of motion.     Cervical back: Normal range of motion.  Skin:    General: Skin is warm.  Neurological:     General: No focal deficit present.     Mental Status: He is alert and oriented to person, place, and time.  Psychiatric:        Mood and Affect: Mood normal.     PLAN Problem List Items Addressed This Visit     Testosterone deficiency in male    Current testosterone dose appears to be effective for symptom management. We will plan to continue at the current dose and monitor labs in about 6 months to ensure that testosterone levels remain stable and appropriate.  Plan: - Continue current testosterone regimen      Class 1 obesity due to excess calories with serious comorbidity and body mass index (BMI) of 34.0 to 34.9 in adult - Primary    Gerald [name] has been on semaglutide with B12 for approximately 6-10 months and has lost around  30 pounds. Currently plateauing at 210-205 lbs. His previous medication was compounded. We discussed transitioning to Zepbound today for improvement of weight management. I strongly recommend continuation of diet and exercise and eating small, frequent meals to help reduce indigestion and keep blood sugars at a stable level.  Plan:  - Discontinue semaglutide and switch to ZepBound (tirzepatide) for better weight loss results. - Provide a printed prescription for [facility] to shop around due to availability concerns. Prior authorization from insurance may be needed. - Consume 20-30 grams of protein per meal (60-90 grams per day) for better blood sugar stability and weight loss. - Recommend reducing soda intake and increasing water  consumption.      Flexural eczema    Izic reports eczema on the back, chest, and underarms. Triamcinolone 0.1% has not been effective in complete management.  Plan: Will send triamcinolone 0.5%      Relevant Medications   triamcinolone ointment (KENALOG) 0.5 %   Mixed hyperlipidemia   High blood triglycerides    Return in about 4 months (around 05/20/2023) for Med Management 45.  Time: 39 minutes, >50% spent counseling, care coordination, chart review, and documentation.   Shawna Clamp, DNP, AGNP-c

## 2023-01-19 ENCOUNTER — Telehealth: Payer: Self-pay | Admitting: Nurse Practitioner

## 2023-01-19 IMAGING — XA IR PERC PLACEMENT GASTROSTOMY
2 series · 2 of 2 positions shown · non-contrast
Comparison: none

CLINICAL DATA: Tonsillar carcinoma, needs enteral feeding support

EXAM:
PERC PLACEMENT GASTROSTOMY
FLUOROSCOPY TIME:  2 minutes 24 seconds; 58 mGy
TECHNIQUE: The procedure, risks, benefits, and alternatives were explained to
the patient. Questions regarding the procedure were encouraged and
answered. The patient understands and consents to the procedure.

[Series 1: fl (-) angio · 1 of 1 slices shown (1 of 2)]
[im 1/1]
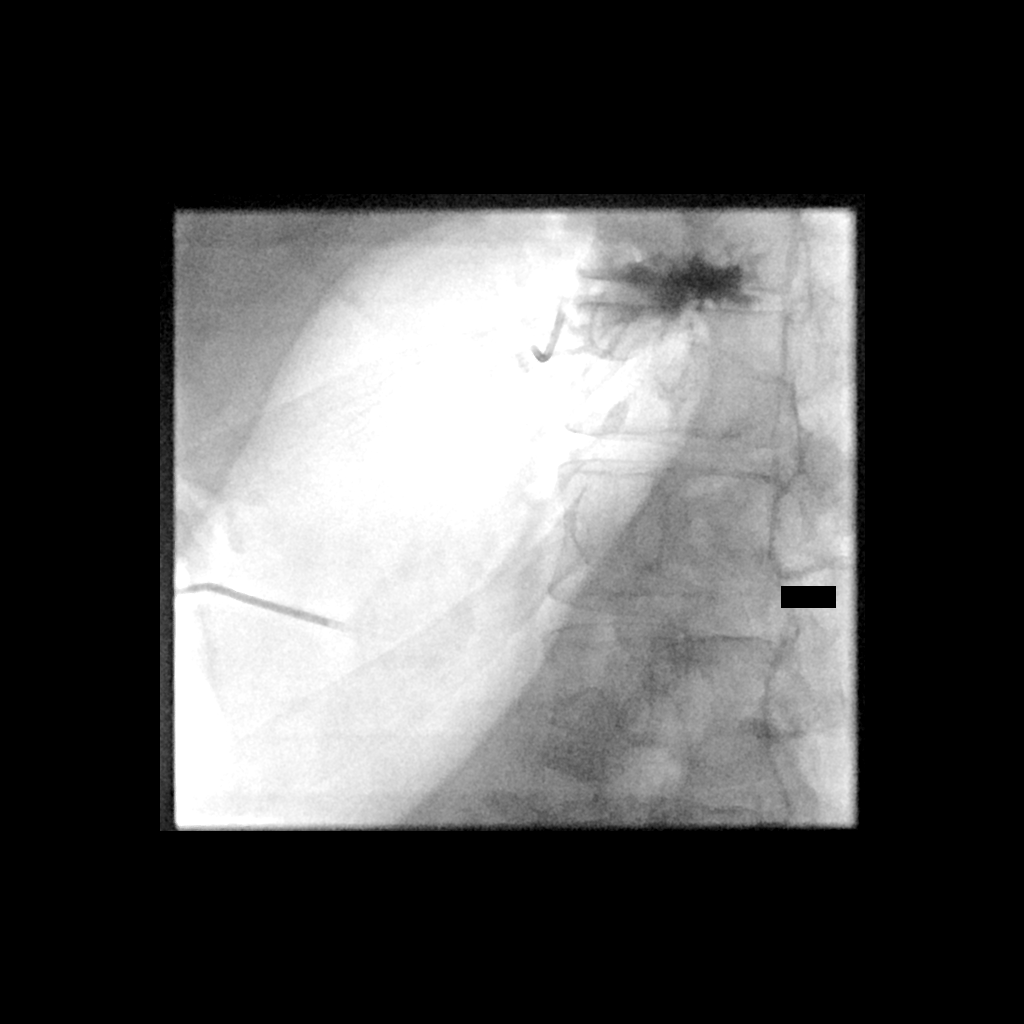

[Series 2: fl (-) angio · 1 of 1 slices shown (2 of 2)]
[im 1/1]
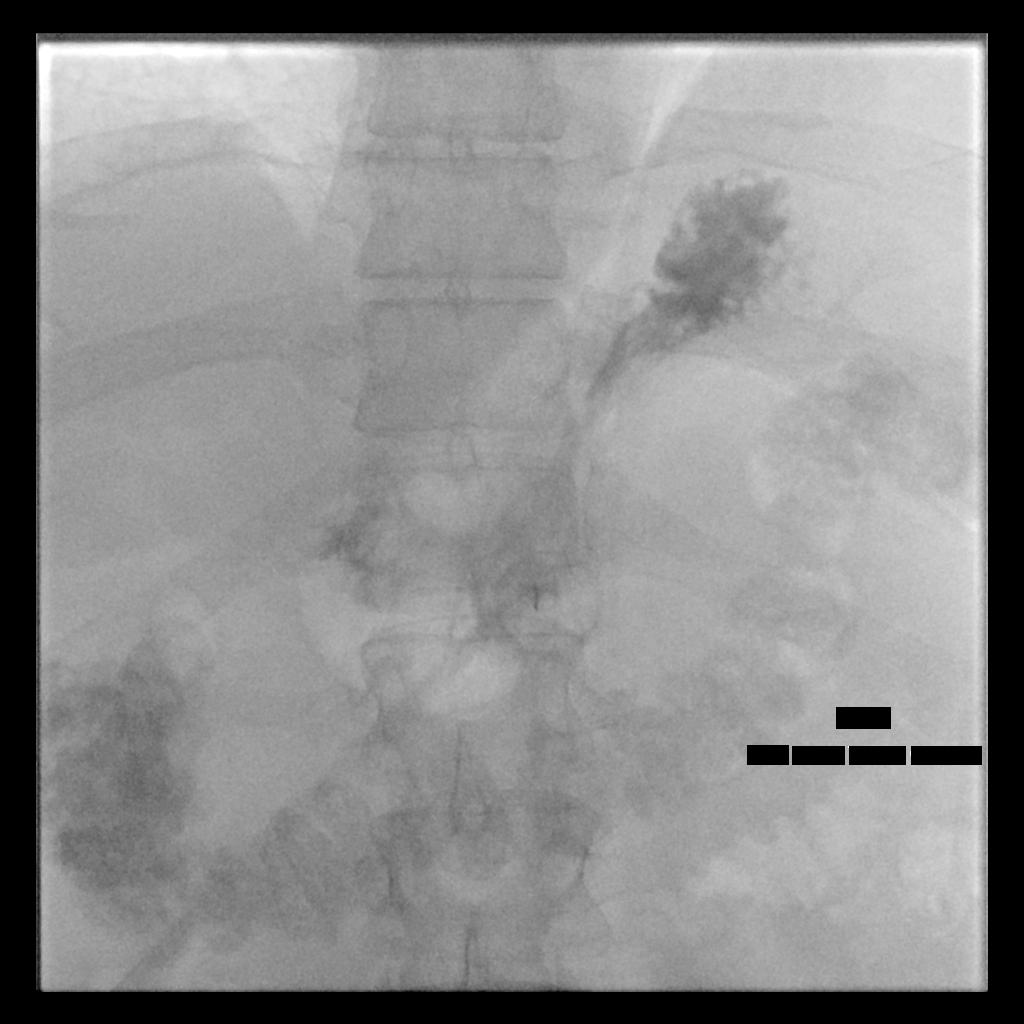

[2 of 2 positions shown; findings below may reference images not displayed]

As antibiotic prophylaxis, cefazolin 2 g was ordered pre-procedure
and administered intravenously within one hour of
incision.Progression of previously administered oral barium into the
colon was confirmed fluoroscopically. A 5 French angiographic
catheter was placed as orogastric tube. The upper abdomen was
prepped with Betadine, draped in usual sterile fashion, and
infiltrated locally with 1% lidocaine.

Intravenous Fentanyl 666mcg and Versed 4mg were administered as
conscious sedation during continuous monitoring of the patient's
level of consciousness and physiological / cardiorespiratory status
by the radiology RN, with a total moderate sedation time of 71
minutes. Stomach was insufflated using air through the orogastric
tube. An 18 French sheath needle was advanced percutaneously into
the gastric lumen under fluoroscopy. Gas could be aspirated and a
small contrast injection confirmed intraluminal spread. The sheath
was exchanged over a guidewire for a 9 French vascular sheath,
through which the snare device was advanced and used to snare a
guidewire passed through the orogastric tube. This was withdrawn,
and the snare attached to the 20 French pull-through gastrostomy
tube, which was advanced antegrade, positioned with the internal
bumper securing the anterior gastric wall to the anterior abdominal
wall. Small contrast injection confirms appropriate positioning. The
external bumper was applied and the catheter was flushed.

COMPLICATIONS:
COMPLICATIONS
none
IMPRESSION: 1. Technically successful 20 French pull-through gastrostomy
placement under fluoroscopy.

## 2023-01-19 NOTE — Telephone Encounter (Unsigned)
Pt called & states Zepbound requiring P.A. he has the written Rx & found medication at [facility]. This is new start, has been on Ozempic in past.

## 2023-01-20 NOTE — Telephone Encounter (Signed)
PA approved sent my chart message

## 2023-02-12 ENCOUNTER — Telehealth: Payer: Self-pay | Admitting: Nurse Practitioner

## 2023-02-12 NOTE — Telephone Encounter (Signed)
Pt called to report he has completed 4th week of the first month of Zepbound 5mg  & has gained a couple pounds, he had lost 25 lbs on Wegovy & trying to get to the right dose on Zepbound, he had no issues with side effects, he would like to go up to Zepbound 7.5mg  please send to [facility].

## 2023-02-13 ENCOUNTER — Other Ambulatory Visit: Payer: Self-pay | Admitting: Nurse Practitioner

## 2023-02-13 DIAGNOSIS — E66811 Obesity, class 1: Secondary | ICD-10-CM

## 2023-02-13 DIAGNOSIS — E782 Mixed hyperlipidemia: Secondary | ICD-10-CM

## 2023-02-13 DIAGNOSIS — E6609 Other obesity due to excess calories: Secondary | ICD-10-CM

## 2023-02-13 DIAGNOSIS — E781 Pure hyperglyceridemia: Secondary | ICD-10-CM

## 2023-02-13 MED ORDER — ZEPBOUND 7.5 MG/0.5ML ~~LOC~~ SOAJ: 7.5000 mg | SUBCUTANEOUS | 0 refills | Status: DC

## 2023-02-16 ENCOUNTER — Other Ambulatory Visit: Payer: Self-pay | Admitting: Nurse Practitioner

## 2023-02-16 NOTE — Telephone Encounter (Signed)
Refill request last apt 01/18/23.

## 2023-02-21 NOTE — Assessment & Plan Note (Deleted)
Gerald [name] has been on semaglutide with B12 for approximately 6-10 months and has lost around 30 pounds. Currently plateauing at 210-205 lbs. His previous medication was compounded. We discussed transitioning to Zepbound today for improvement of weight management. I strongly recommend continuation of diet and exercise and eating small, frequent meals to help reduce indigestion and keep blood sugars at a stable level.  Plan:  - Discontinue semaglutide and switch to ZepBound (tirzepatide) for better weight loss results. - Provide a printed prescription for [name] to shop around due to availability concerns. Prior authorization from insurance may be needed. - Consume 20-30 grams of protein per meal (60-90 grams per day) for better blood sugar stability and weight loss. - Recommend reducing soda intake and increasing water consumption.

## 2023-02-21 NOTE — Assessment & Plan Note (Signed)
Ericberto has been on semaglutide with B12 for approximately 6-10 months and has lost around 30 pounds. Currently plateauing at 210-205 lbs. His previous medication was compounded. We discussed transitioning to Zepbound today for improvement of weight management. I strongly recommend continuation of diet and exercise and eating small, frequent meals to help reduce indigestion and keep blood sugars at a stable level.  Plan:  - Discontinue semaglutide and switch to ZepBound (tirzepatide) for better weight loss results. - Provide a printed prescription for [name] to shop around due to availability concerns. Prior authorization from insurance may be needed. - Consume 20-30 grams of protein per meal (60-90 grams per day) for better blood sugar stability and weight loss. - Recommend reducing soda intake and increasing water consumption.

## 2023-02-21 NOTE — Assessment & Plan Note (Signed)
Current testosterone dose appears to be effective for symptom management. We will plan to continue at the current dose and monitor labs in about 6 months to ensure that testosterone levels remain stable and appropriate.  Plan: - Continue current testosterone regimen

## 2023-02-21 NOTE — Assessment & Plan Note (Signed)
Seger reports eczema on the back, chest, and underarms. Triamcinolone 0.1% has not been effective in complete management.  Plan: Will send triamcinolone 0.5%

## 2023-02-21 NOTE — Assessment & Plan Note (Signed)
>>  ASSESSMENT AND PLAN FOR FLEXURAL ECZEMA WRITTEN ON 02/21/2023 10:53 PM BY Mega Kinkade E, NP  Gerald [name] reports eczema on the back, chest, and underarms. Triamcinolone  0.1% has not been effective in complete management.  Plan: Will send triamcinolone  0.5%

## 2023-03-10 ENCOUNTER — Other Ambulatory Visit: Payer: Self-pay | Admitting: Family Medicine

## 2023-03-15 ENCOUNTER — Telehealth: Payer: Self-pay

## 2023-03-15 DIAGNOSIS — E6609 Other obesity due to excess calories: Secondary | ICD-10-CM

## 2023-03-15 DIAGNOSIS — E781 Pure hyperglyceridemia: Secondary | ICD-10-CM

## 2023-03-15 DIAGNOSIS — E782 Mixed hyperlipidemia: Secondary | ICD-10-CM

## 2023-03-15 NOTE — Telephone Encounter (Signed)
Outcome: Clinical Override not needed Drug: Zepbound 7.5MG /0.5ML pen-injectors Form: [employer] Electronic PA Form [phone] NCPDP)

## 2023-03-15 NOTE — Telephone Encounter (Signed)
Spoke to the pharmacist bc PA did not need clinical override. States the pt has already had the starter dose and needs to be increased. Insurance will not allow coverage but one time for starter dose.

## 2023-03-16 MED ORDER — TIRZEPATIDE 10 MG/0.5ML ~~LOC~~ SOAJ: 10.0000 mg | SUBCUTANEOUS | 0 refills | Status: DC

## 2023-03-16 NOTE — Telephone Encounter (Signed)
Can you help me determine what they mean by this? Starter dose is 2.5mg . He has been on 5mg  and we moved up to 7.5mg  last month. Do we need to go up to 10mg  or is he OK staying on the 7.5? He has been unable to get the 10mg , which is why we have stayed at the same dose.

## 2023-03-16 NOTE — Telephone Encounter (Signed)
According to the pharmacist, his insurance will only approve a one month dosage one time. Since he has already had the 5 mg and 7.5 mg, he may need to increase the dosage.   According to insurance, he has exceeded the plan limitation of 2 mL in 365 days. If he would like to stay on 7.5, a quantity limits exception form would need to be completed.   If he would like to increase to 10 mg, the test claim has been approved.

## 2023-03-19 ENCOUNTER — Telehealth: Payer: Self-pay | Admitting: Nurse Practitioner

## 2023-03-19 MED ORDER — ZEPBOUND 10 MG/0.5ML ~~LOC~~ SOAJ: 10.0000 mg | SUBCUTANEOUS | 0 refills | Status: DC

## 2023-03-19 NOTE — Telephone Encounter (Signed)
I corrected Rx back to Zepbound

## 2023-03-19 NOTE — Telephone Encounter (Signed)
Pt needed to be switched to Zepbound 10mg  not Mounjaro 10 mg, he states insurance also confirmed that the 10mg  is covered & there wasn't a limit on how many months he could stay on the 10mg 

## 2023-04-26 LAB — TESTOSTERONE: Testosterone: 1362.29

## 2023-04-26 LAB — CBC AND DIFFERENTIAL
HCT: 44 (ref 41–53)
Hemoglobin: 16.1 (ref 13.5–17.5)
Platelets: 211 10*3/uL (ref 150–400)
WBC: 7

## 2023-04-26 LAB — CBC: RBC: 4.57 (ref 3.87–5.11)

## 2023-04-27 ENCOUNTER — Other Ambulatory Visit: Payer: Self-pay | Admitting: Nurse Practitioner

## 2023-04-27 DIAGNOSIS — Z1211 Encounter for screening for malignant neoplasm of colon: Secondary | ICD-10-CM

## 2023-04-27 DIAGNOSIS — Z1212 Encounter for screening for malignant neoplasm of rectum: Secondary | ICD-10-CM

## 2023-05-21 ENCOUNTER — Encounter: Payer: 59 | Admitting: Nurse Practitioner

## 2023-06-05 ENCOUNTER — Encounter: Payer: 59 | Admitting: Nurse Practitioner

## 2023-06-08 ENCOUNTER — Encounter: Payer: Self-pay | Admitting: Nurse Practitioner

## 2023-06-08 ENCOUNTER — Ambulatory Visit: Payer: 59 | Admitting: Nurse Practitioner

## 2023-06-08 VITALS — BP 120/76 | HR 82 | Ht 69.0 in | Wt 194.0 lb

## 2023-06-08 DIAGNOSIS — D61818 Other pancytopenia: Secondary | ICD-10-CM

## 2023-06-08 DIAGNOSIS — E559 Vitamin D deficiency, unspecified: Secondary | ICD-10-CM | POA: Diagnosis not present

## 2023-06-08 DIAGNOSIS — E66811 Obesity, class 1: Secondary | ICD-10-CM

## 2023-06-08 DIAGNOSIS — R5383 Other fatigue: Secondary | ICD-10-CM | POA: Diagnosis not present

## 2023-06-08 DIAGNOSIS — E538 Deficiency of other specified B group vitamins: Secondary | ICD-10-CM

## 2023-06-08 DIAGNOSIS — E6609 Other obesity due to excess calories: Secondary | ICD-10-CM

## 2023-06-08 DIAGNOSIS — Z6834 Body mass index (BMI) 34.0-34.9, adult: Secondary | ICD-10-CM

## 2023-06-08 DIAGNOSIS — E039 Hypothyroidism, unspecified: Secondary | ICD-10-CM

## 2023-06-08 MED ORDER — LEVOTHYROXINE SODIUM 112 MCG PO TABS: 112.0000 ug | ORAL_TABLET | Freq: Every day | ORAL | 3 refills | Status: DC

## 2023-06-08 NOTE — Progress Notes (Signed)
Shawna Clamp, DNP, AGNP-c [facility]  [address] [place], [place] [address] [phone]  ESTABLISHED PATIENT- Chronic Health and/or Follow-Up Visit  Blood pressure 120/76, pulse 82, height 5\' 9"  (1.753 m), weight 194 lb (88 kg).    Gerald [name] is a 52 y.o. year old male presenting today for evaluation and management of chronic conditions.   History of Present Illness Jabreel reports a positive response to Zepbound for weight management. He notes a weight loss of two to three pounds per month over the last couple of months, with reducing his portions and excluding soda from his diet. For exercise routine he is walking daily. The patient reports eating once a day, with protein shakes in the morning, and often leaves half of his meal uneaten. He expresses concern about potential muscle loss due to the weight loss, but overall, he feels well and is closely monitoring his health.   The patient reports a decrease in energy levels, often feeling exhausted despite getting ample sleep. He notes that he often sleeps for about ten hours a night but still wakes up feeling tired. He denies any symptoms of sleep apnea and has a sleep number bed that monitors his sleep quality. He does have a history of testosterone deficiency but is on replacement therapy at this time.   The patient also reports a recent fall due to slipping on a tile floor while wearing slick shoes. He notes some tenderness in the area of the fall but is keeping an eye on it.  The patient has a history of cancer and is currently in full remission. He has been managing his health proactively, with regular screenings and check-ups. He also reports a history of low B12 levels and is considering starting B12 supplements again. 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  The patient has been on Synthroid and reports that his thyroid levels have been normal. He denies any issues  with his Synthroid or Zepbound medications, except for slight constipation, which he manages with fiber supplements. He also reports occasional heartburn. All ROS negative with exception of what is listed above.   PHYSICAL EXAM Physical Exam Vitals and nursing note reviewed.  Constitutional:      Appearance: Normal appearance.  HENT:     Head: Normocephalic.  Eyes:     Pupils: Pupils are equal, round, and reactive to light.  Cardiovascular:     Rate and Rhythm: Normal rate and regular rhythm.     Pulses: Normal pulses.     Heart sounds: Normal heart sounds.  Pulmonary:     Effort: Pulmonary effort is normal.     Breath sounds: Normal breath sounds.  Abdominal:     General: Bowel sounds are normal.     Palpations: Abdomen is soft.  Musculoskeletal:        General: Normal range of motion.     Cervical back: Normal range of motion.     Right lower leg: No edema.     Left lower leg: No edema.  Skin:    General: Skin is warm.  Neurological:     General: No focal deficit present.     Mental Status: He is alert and oriented to person, place, and time.     Motor: No weakness.  Psychiatric:        Mood and Affect: Mood normal.      PLAN Problem List Items Addressed This Visit     Pancytopenia (HCC)  Post-cancer treatment hyperchromic, microcytic anemia with normal iron levels but abnormal red cell indices. Previous hematology evaluations inconclusive. Discussed erythropoietin treatment if anemia significantly contributes to fatigue, though current levels not critically low. - Monitor red cell indices - Consider hematology referral if symptoms persist or worsen      Relevant Orders   Reticulocytes (Completed)   Vitamin B6 (Completed)   Vitamin B2, Whole Blood (Completed)   Hypothyroidism    Chronic with management with levothyroxine with no alarm symptoms present, aside from fatigue. Labs pending.       Relevant Medications   levothyroxine (SYNTHROID) 112 MCG tablet    Class 1 obesity due to excess calories with serious comorbidity and body mass index (BMI) of 34.0 to 34.9 in adult    Patient on Zepbound for weight loss, experiencing significant reduction. Reduced meal portions, eating once daily with protein shakes. Current weight 192 lbs, aiming for BMI ~25. Considering transitioning to maintenance dose of Zepbound. Discussed risks of muscle catabolism and importance of monitoring muscle mass. Patient prefers self-managing transition, extending interval between doses based on hunger levels. - Continue current Zepbound regimen - Transition to maintenance dose if weight <190 lbs - Monitor muscle mass, adjust diet/exercise as needed      Fatigue - Primary    Persistent fatigue despite adequate sleep. Testosterone levels high (1300 in trough), normal thyroid function (TSH, free T4). Abnormal red cell indices, post-cancer treatment hyperchromic, microcytic anemia. B12 levels not checked in over a year, history of low B12. Discussed potential benefits of B12 supplementation and evaluation of other B vitamins (B2, B6). - Order B12 level - Consider B12 supplementation based on results - Evaluate B2, B6 levels for deficiencies       Relevant Orders   Vitamin B12 (Completed)   VITAMIN D 25 Hydroxy (Vit-D Deficiency, Fractures)   Vitamin B6 (Completed)   Vitamin B2, Whole Blood (Completed)   B12 deficiency    Post-cancer treatment hyperchromic, microcytic anemia with normal iron levels but abnormal red cell indices. Previous hematology evaluations inconclusive. Discussed erythropoietin treatment if anemia significantly contributes to fatigue, though current levels not critically low. - Monitor red cell indices - Consider hematology referral if symptoms persist or worsen      Relevant Orders   Vitamin B12 (Completed)   Vitamin B6 (Completed)   Vitamin B2, Whole Blood (Completed)   Vitamin D deficiency    Repeat labs today for evaluation.       Relevant  Orders   VITAMIN D 25 Hydroxy (Vit-D Deficiency, Fractures)    Return in about 6 months (around 12/06/2023) for CPE.  Shawna Clamp, DNP, AGNP-c

## 2023-06-12 ENCOUNTER — Other Ambulatory Visit: Payer: Self-pay | Admitting: Nurse Practitioner

## 2023-06-12 ENCOUNTER — Encounter: Payer: Self-pay | Admitting: Nurse Practitioner

## 2023-06-14 LAB — VITAMIN B6: Vitamin B6: 16.2 ug/L (ref 3.4–65.2)

## 2023-06-14 LAB — RETICULOCYTES: Retic Ct Pct: 1.4 % (ref 0.6–2.6)

## 2023-06-14 LAB — VITAMIN B12: Vitamin B-12: 598 pg/mL (ref 232–1245)

## 2023-06-14 LAB — VITAMIN B2, WHOLE BLOOD: Vitamin B2, Whole Blood: 218 ug/L (ref 137–370)

## 2023-06-17 DIAGNOSIS — R5383 Other fatigue: Secondary | ICD-10-CM

## 2023-06-17 DIAGNOSIS — E538 Deficiency of other specified B group vitamins: Secondary | ICD-10-CM | POA: Insufficient documentation

## 2023-06-17 DIAGNOSIS — E559 Vitamin D deficiency, unspecified: Secondary | ICD-10-CM | POA: Insufficient documentation

## 2023-06-17 HISTORY — DX: Other fatigue: R53.83

## 2023-06-17 NOTE — Assessment & Plan Note (Signed)
Post-cancer treatment hyperchromic, microcytic anemia with normal iron levels but abnormal red cell indices. Previous hematology evaluations inconclusive. Discussed erythropoietin treatment if anemia significantly contributes to fatigue, though current levels not critically low. - Monitor red cell indices - Consider hematology referral if symptoms persist or worsen

## 2023-06-17 NOTE — Assessment & Plan Note (Signed)
Repeat labs today for evaluation

## 2023-06-17 NOTE — Assessment & Plan Note (Signed)
Patient on Zepbound for weight loss, experiencing significant reduction. Reduced meal portions, eating once daily with protein shakes. Current weight 192 lbs, aiming for BMI ~25. Considering transitioning to maintenance dose of Zepbound. Discussed risks of muscle catabolism and importance of monitoring muscle mass. Patient prefers self-managing transition, extending interval between doses based on hunger levels. - Continue current Zepbound regimen - Transition to maintenance dose if weight <190 lbs - Monitor muscle mass, adjust diet/exercise as needed

## 2023-06-17 NOTE — Assessment & Plan Note (Signed)
Persistent fatigue despite adequate sleep. Testosterone levels high (1300 in trough), normal thyroid function (TSH, free T4). Abnormal red cell indices, post-cancer treatment hyperchromic, microcytic anemia. B12 levels not checked in over a year, history of low B12. Discussed potential benefits of B12 supplementation and evaluation of other B vitamins (B2, B6). - Order B12 level - Consider B12 supplementation based on results - Evaluate B2, B6 levels for deficiencies

## 2023-06-17 NOTE — Assessment & Plan Note (Signed)
Chronic with management with levothyroxine with no alarm symptoms present, aside from fatigue. Labs pending.

## 2023-06-30 ENCOUNTER — Other Ambulatory Visit: Payer: Self-pay | Admitting: Nurse Practitioner

## 2023-06-30 DIAGNOSIS — E039 Hypothyroidism, unspecified: Secondary | ICD-10-CM

## 2023-08-02 ENCOUNTER — Other Ambulatory Visit: Payer: Self-pay | Admitting: Nurse Practitioner

## 2023-08-02 DIAGNOSIS — E039 Hypothyroidism, unspecified: Secondary | ICD-10-CM

## 2023-08-08 ENCOUNTER — Other Ambulatory Visit: Payer: Self-pay | Admitting: Nurse Practitioner

## 2023-08-21 ENCOUNTER — Other Ambulatory Visit (HOSPITAL_COMMUNITY): Payer: Self-pay

## 2023-08-21 ENCOUNTER — Telehealth: Payer: Self-pay

## 2023-08-21 NOTE — Telephone Encounter (Signed)
Pharmacy Patient Advocate Encounter   Received notification from CoverMyMeds that prior authorization for Zepbound 5mg  is required/requested.   Insurance verification completed.   The patient is insured through [employer] .   Per test claim: PA required; PA submitted to above mentioned insurance via CoverMyMeds Key/confirmation #/EOC (Key: [id number]) Status is pending

## 2023-08-22 ENCOUNTER — Other Ambulatory Visit (HOSPITAL_COMMUNITY): Payer: Self-pay

## 2023-08-22 NOTE — Telephone Encounter (Signed)
Pharmacy Patient Advocate Encounter  Received notification from [employer] that Prior Authorization for Zepbound 5mg  has been APPROVED from 12.29.24 to 1.28.26. Ran test claim, Copay is $35.00. This test claim was processed through [facility]- copay amounts may vary at other pharmacies due to pharmacy/plan contracts, or as the patient moves through the different stages of their insurance plan.

## 2023-09-17 ENCOUNTER — Encounter: Payer: Self-pay | Admitting: Nurse Practitioner

## 2023-09-18 ENCOUNTER — Other Ambulatory Visit: Payer: Self-pay

## 2023-09-18 MED ORDER — ZEPBOUND 10 MG/0.5ML ~~LOC~~ SOAJ: 10.0000 mg | SUBCUTANEOUS | 1 refills | Status: DC

## 2023-11-04 ENCOUNTER — Other Ambulatory Visit: Payer: Self-pay | Admitting: Nurse Practitioner

## 2023-11-05 NOTE — Telephone Encounter (Signed)
 Last apt 06/08/23.

## 2023-11-19 ENCOUNTER — Encounter: Payer: Self-pay | Admitting: Nurse Practitioner

## 2023-11-19 ENCOUNTER — Ambulatory Visit: Admitting: Nurse Practitioner

## 2023-11-19 VITALS — BP 120/82 | HR 78 | Wt 184.8 lb

## 2023-11-19 DIAGNOSIS — E039 Hypothyroidism, unspecified: Secondary | ICD-10-CM

## 2023-11-19 DIAGNOSIS — R5382 Chronic fatigue, unspecified: Secondary | ICD-10-CM | POA: Diagnosis not present

## 2023-11-19 DIAGNOSIS — E66811 Obesity, class 1: Secondary | ICD-10-CM

## 2023-11-19 DIAGNOSIS — N529 Male erectile dysfunction, unspecified: Secondary | ICD-10-CM

## 2023-11-19 DIAGNOSIS — E559 Vitamin D deficiency, unspecified: Secondary | ICD-10-CM | POA: Diagnosis not present

## 2023-11-19 DIAGNOSIS — G4733 Obstructive sleep apnea (adult) (pediatric): Secondary | ICD-10-CM

## 2023-11-19 DIAGNOSIS — Z6834 Body mass index (BMI) 34.0-34.9, adult: Secondary | ICD-10-CM

## 2023-11-19 DIAGNOSIS — E6609 Other obesity due to excess calories: Secondary | ICD-10-CM

## 2023-11-19 DIAGNOSIS — M1711 Unilateral primary osteoarthritis, right knee: Secondary | ICD-10-CM | POA: Insufficient documentation

## 2023-11-19 MED ORDER — TADALAFIL 5 MG PO TABS: ORAL_TABLET | ORAL | 3 refills | Status: AC

## 2023-11-19 MED ORDER — VITAMIN D (ERGOCALCIFEROL) 1.25 MG (50000 UNIT) PO CAPS: 50000.0000 [IU] | ORAL_CAPSULE | ORAL | 3 refills | Status: AC

## 2023-11-19 MED ORDER — VITAMIN D (ERGOCALCIFEROL) 1.25 MG (50000 UNIT) PO CAPS: 50000.0000 [IU] | ORAL_CAPSULE | ORAL | 3 refills | Status: DC

## 2023-11-19 MED ORDER — TIRZEPATIDE-WEIGHT MANAGEMENT 15 MG/0.5ML ~~LOC~~ SOAJ: 15.0000 mg | SUBCUTANEOUS | 3 refills | Status: AC

## 2023-11-19 NOTE — Assessment & Plan Note (Signed)
 He avoids sun exposure due to a history of throat cancer, likely resulting in low vitamin D  levels. Previous levels were critically low. - Prescribe vitamin D  supplementation refill

## 2023-11-19 NOTE — Patient Instructions (Addendum)
 I have sent in the refill of the Zepbound  at the 15mg  dose for you.   I have sent the referral to Urology for you. They will contact you to schedule this.   I sent the tadalafil  in for you at 5mg  once a day. You can take this at 2.5mg  or 5mg  once daily, whatever dose works best for you.   I sent in a refill of the vitamin D  and we will recheck the labs today.   Let me know what your thyroid  levels show and we can change this medication based on the labs.

## 2023-11-19 NOTE — Progress Notes (Signed)
 [name], DNP, AGNP-c [facility]  [address] [place], [place] [address] [phone]  ESTABLISHED PATIENT- Chronic Health and/or Follow-Up Visit  Blood pressure 120/82, pulse 78, weight 184 lb 12.8 oz (83.8 kg).    Gerald [name] is a 53 y.o. year old male presenting today for evaluation and management of chronic conditions.   History of Present Illness Gerald [name] is a 53 year old male who presents for weight management and concerns about vitamin D  deficiency.  He has experienced significant weight loss, reducing from 270 pounds before his cancer diagnosis to between 175 and 180 pounds currently. This weight loss is attributed to the use of Zepbound , diet, and exercise regimen. He is ready to increase to the 15mg  dose as he is experiencing some hunger towards the end of his current dose. He is satisfied with his current weight and mentions that his BMI is at a low point.  He is concerned about his vitamin D  levels, noting that he has been off vitamin D  supplements for some time and suspects his levels are low due to limited sun exposure following his cancer treatment. He recalls having critically low vitamin D  levels in the past and experiences persistent fatigue, which he attributes to his low vitamin D  levels and possibly his thyroid  function. He has a history of throat cancer, which has led him to avoid sun exposure to prevent further complications. He also experiences cold hands and feet, which he associates with possible abnormality in his thyroid  condition.  He reports ongoing fatigue despite using a CPAP machine for sleep apnea, which has not significantly improved his energy levels. He has previously undergone a sleep study and is currently using testosterone  therapy, which initially helped but no longer seems effective. His testosterone  levels were last noted to be on the high end of normal at a recent check with the [place].   He discusses erectile  dysfunction, noting that he requires medication to perform sexually. He is interested in trying a daily low dose of daily Cialis  to manage this issue, as he finds the current situation frustrating for both him and his partner.  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  All ROS negative with exception of what is listed above.   PHYSICAL EXAM Physical Exam Vitals and nursing note reviewed.  Constitutional:      Appearance: Normal appearance. He is normal weight.  HENT:     Head: Normocephalic.  Eyes:     Conjunctiva/sclera: Conjunctivae normal.  Cardiovascular:     Rate and Rhythm: Normal rate and regular rhythm.     Pulses: Normal pulses.     Heart sounds: Normal heart sounds.  Pulmonary:     Effort: Pulmonary effort is normal.     Breath sounds: Normal breath sounds.  Abdominal:     General: Bowel sounds are normal.     Palpations: Abdomen is soft.  Musculoskeletal:     Right lower leg: No edema.     Left lower leg: No edema.  Skin:    General: Skin is warm and dry.     Capillary Refill: Capillary refill takes less than 2 seconds.  Neurological:     Mental Status: He is alert and oriented to person, place, and time.  Psychiatric:        Mood and Affect: Mood normal.        Behavior:  Behavior normal.      PLAN Problem List Items Addressed This Visit     Impotence due to erectile dysfunction   He experiences erectile dysfunction and prefers a daily low-dose tadalafil  (Cialis ) regimen to avoid timing issues with partner. Previous use of Viagra  resulted in headaches. He is interested in a more spontaneous option and is considering a urology consult for further evaluation. Daily tadalafil  should help maintain readiness without the need for timing doses. - Prescribe tadalafil  5 mg daily, with option to adjust dose. - Consider  referral to urology for further evaluation if needed.       Relevant Medications   tadalafil  (CIALIS ) 5 MG tablet   Other Relevant Orders   Ambulatory referral to Urology   Erectile disorder   He experiences erectile dysfunction and prefers a daily low-dose tadalafil  (Cialis ) regimen to avoid timing issues with partner. Previous use of Viagra  resulted in headaches. He is interested in a more spontaneous option and is considering a urology consult for further evaluation. Daily tadalafil  should help maintain readiness without the need for timing doses. - Prescribe tadalafil  5 mg daily, with option to adjust dose. - Consider referral to urology for further evaluation if needed.       Hypothyroidism   Hypothyroidism is well-controlled with levothyroxine . Recent labs at the [place], not back yet. He experiences cold hands and feet, which may be related to thyroid  function. Awaiting recent lab results from [place] for further assessment. - Review recent thyroid  function test results from [place] when available. - Continue current levothyroxine  therapy, consider adding liothyronine if needed for T4 management.       Class 1 obesity due to excess calories with serious comorbidity and body mass index (BMI) of 34.0 to 34.9 in adult - Primary   He has successfully reduced weight from 270 lbs to 175-180 lbs using Zepbound  (a GLP-1 receptor agonist), achieving a loss of approximately 95 lbs, which exceeds the typical 30-40 lbs. He is satisfied with his current weight. Insurance coverage will not be affected as it is based on starting weight and BMI. He experiences hunger at the end of the current dose cycle and plans to increase to 15 mg to manage hunger better. - Continue Zepbound , increase dose to 15 mg and space doses as needed.       Relevant Medications   tirzepatide  (ZEPBOUND ) 15 MG/0.5ML Pen   Fatigue   Persistent fatigue despite treatment for hypothyroidism and use of CPAP for sleep apnea. Testosterone   levels are on high end of normal with replacement therapy. Vitamin D  deficiency may be a contributing factor. We also discussed the sequela of cancer and treatment as a possible cause. Will monitor closely.        Vitamin D  deficiency   He avoids sun exposure due to a history of throat cancer, likely resulting in low vitamin D  levels. Previous levels were critically low. - Prescribe vitamin D  supplementation refill       Relevant Medications   Vitamin D , Ergocalciferol , (DRISDOL) 1.25 MG (50000 UNIT) CAPS capsule   Obstructive sleep apnea syndrome   He has been using a CPAP machine for a few months but reports no significant improvement in fatigue. He will continue to use he CPAP to see if this is helpful.        Return for CPE at convenience.  [name], DNP, AGNP-c

## 2023-11-19 NOTE — Assessment & Plan Note (Signed)
 Persistent fatigue despite treatment for hypothyroidism and use of CPAP for sleep apnea. Testosterone  levels are on high end of normal with replacement therapy. Vitamin D  deficiency may be a contributing factor. We also discussed the sequela of cancer and treatment as a possible cause. Will monitor closely.

## 2023-11-19 NOTE — Assessment & Plan Note (Signed)
 He has been using a CPAP machine for a few months but reports no significant improvement in fatigue. He will continue to use he CPAP to see if this is helpful.

## 2023-11-19 NOTE — Assessment & Plan Note (Signed)
 He experiences erectile dysfunction and prefers a daily low-dose tadalafil  (Cialis ) regimen to avoid timing issues with partner. Previous use of Viagra  resulted in headaches. He is interested in a more spontaneous option and is considering a urology consult for further evaluation. Daily tadalafil  should help maintain readiness without the need for timing doses. - Prescribe tadalafil  5 mg daily, with option to adjust dose. - Consider referral to urology for further evaluation if needed.

## 2023-11-19 NOTE — Assessment & Plan Note (Signed)
 Hypothyroidism is well-controlled with levothyroxine . Recent labs at the [place], not back yet. He experiences cold hands and feet, which may be related to thyroid  function. Awaiting recent lab results from [place] for further assessment. - Review recent thyroid  function test results from [place] when available. - Continue current levothyroxine  therapy, consider adding liothyronine if needed for T4 management.

## 2023-11-19 NOTE — Assessment & Plan Note (Addendum)
>>  ASSESSMENT AND PLAN FOR ERECTILE DISORDER WRITTEN ON 11/19/2023  2:33 PM BY Ardella Chhim E, NP  He experiences erectile dysfunction and prefers a daily low-dose tadalafil  (Cialis ) regimen to avoid timing issues with partner. Previous use of Viagra  resulted in headaches. He is interested in a more spontaneous option and is considering a urology consult for further evaluation. Daily tadalafil  should help maintain readiness without the need for timing doses. - Prescribe tadalafil  5 mg daily, with option to adjust dose. - Consider referral to urology for further evaluation if needed.    >>ASSESSMENT AND PLAN FOR IMPOTENCE DUE TO ERECTILE DYSFUNCTION WRITTEN ON 11/19/2023  2:33 PM BY Velia Pamer E, NP  He experiences erectile dysfunction and prefers a daily low-dose tadalafil  (Cialis ) regimen to avoid timing issues with partner. Previous use of Viagra  resulted in headaches. He is interested in a more spontaneous option and is considering a urology consult for further evaluation. Daily tadalafil  should help maintain readiness without the need for timing doses. - Prescribe tadalafil  5 mg daily, with option to adjust dose. - Consider referral to urology for further evaluation if needed.

## 2023-11-19 NOTE — Assessment & Plan Note (Signed)
 He has successfully reduced weight from 270 lbs to 175-180 lbs using Zepbound  (a GLP-1 receptor agonist), achieving a loss of approximately 95 lbs, which exceeds the typical 30-40 lbs. He is satisfied with his current weight. Insurance coverage will not be affected as it is based on starting weight and BMI. He experiences hunger at the end of the current dose cycle and plans to increase to 15 mg to manage hunger better. - Continue Zepbound , increase dose to 15 mg and space doses as needed.

## 2023-11-30 ENCOUNTER — Encounter: Payer: Self-pay | Admitting: Nurse Practitioner

## 2023-12-10 ENCOUNTER — Other Ambulatory Visit (HOSPITAL_COMMUNITY): Payer: Self-pay

## 2023-12-10 ENCOUNTER — Telehealth: Payer: Self-pay

## 2023-12-10 NOTE — Telephone Encounter (Signed)
 Pharmacy Patient Advocate Encounter   Received notification from CoverMyMeds that prior authorization for Testosterone  Cypionate 200MG /ML intramuscular solution is required/requested.   Insurance verification completed.   The patient is insured through [employer] .   Per test claim: PRIOR AUTH IS NOT NEEDED AT THIS TIME

## 2023-12-19 NOTE — Progress Notes (Signed)
 Chief Complaint: No chief complaint on file.   History of Present Illness:  Gerald [name] is a 53 y.o. male who is seen in consultation from Early, Adriane Albe, NP for evaluation of erectile dysfunction.  He has a history of obesity as well as oropharyngeal carcinoma.  He underwent tonsillectomy followed by radiation/chemotherapy in 2022 for this.  He had significant weight loss following that.  He was initially tried on sildenafil  for his ED.  This caused a headache.  Recently, it was suggested he try low-dose tadalafil  on a daily basis.  He does have a history of low testosterone  level.  Most recently he has been on 200 mg of testosterone  cypionate administered every week.  He had had symptomatic improvement of his low energy levels.  Last testosterone  level from April 26, 2023--1362.  This was 1 to 2 days before his injection.  Apparently, following his cancer therapy his testosterone  level is low.  He has been told that he has a large prostate.  Because he was symptomatic in the past, he is on tamsulosin.  He has found that taking the tadalafil  has made his erections adequate and more spontaneous.  No significant side effects although some of his lower urinary tract symptoms have improved.  Current IPSS-6/3.   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   Dehydration 01/29/2021   ED (erectile dysfunction)    Mucositis due to antineoplastic therapy 03/31/2021   Oropharyngeal cancer (HCC) 09/29/2021   Sleep apnea    Tonsillar mass 11/16/2020   Weight loss, unintentional 01/04/2021    Past Surgical History:  Past Surgical History:  Procedure Laterality Date   DIRECT LARYNGOSCOPY N/A 11/22/2020   Procedure: DIRECT LARYNGOSCOPY WITH BIOPSY;  Surgeon: Reynold Caves, MD;  Location: [facility];  Service: ENT;  Laterality: N/A;   FOOT SURGERY  2000   extra nivicular removal   IR  GASTROSTOMY TUBE MOD SED  12/24/2020   IR GASTROSTOMY TUBE REMOVAL  04/11/2021   IR IMAGING GUIDED PORT INSERTION  12/24/2020   IR REMOVAL TUN ACCESS W/ PORT W/O FL MOD SED  08/25/2021   SPINE SURGERY     TONSILLECTOMY Left 11/22/2020   Procedure: LEFT TONSILLECTOMY;  Surgeon: Reynold Caves, MD;  Location: [facility];  Service: ENT;  Laterality: Left;    Allergies:  No Known Allergies  Family History:  No family history on file.  Social History:  Social History   Tobacco Use   Smoking status: Never   Smokeless tobacco: Never  Vaping Use   Vaping status: Never Used  Substance Use Topics   Alcohol use: Not Currently    Comment: rare   Drug use: Never    Review of symptoms:  Constitutional:  Negative for unexplained weight loss, night sweats, fever, chills ENT:  Negative for nose bleeds, sinus pain, painful swallowing CV:  Negative for chest pain, shortness of breath, exercise intolerance, palpitations, loss of consciousness Resp:  Negative for cough, wheezing, shortness of breath GI:  Negative for nausea, vomiting, diarrhea, bloody stools GU:  Positives noted in HPI; otherwise negative for gross hematuria, dysuria, urinary incontinence Neuro:  Negative for seizures, poor balance, limb weakness, slurred speech Psych:  Negative for lack of energy, depression, anxiety Endocrine:  Negative for polydipsia, polyuria, symptoms of hypoglycemia (dizziness, hunger, sweating) Hematologic:  Negative for anemia, purpura, petechia, prolonged or excessive bleeding, use of anticoagulants  Allergic:  Negative for difficulty breathing or choking as a result of exposure to anything; no shellfish allergy; no allergic response (rash/itch) to materials, foods  Physical exam: There were no vitals taken for this visit. GENERAL APPEARANCE:  Well appearing, well developed, well nourished, NAD HEENT: Atraumatic, Normocephalic. NECK: Normal appearance LUNGS: Normal inspiratory and expiratory  excursion HEART: Regular Rate ABDOMEN: No inguinal hernias GU: Phallus normal, no lesions. Scrotal skin normal. Testicles atrophic bilaterally meatus normal. Normal anal sphincter tone, prostate 50 mL, symmetric, non nodular, non tender. EXTREMITIES: Moves all extremities well.  Without clubbing, cyanosis, or edema. NEUROLOGIC:  Alert and oriented x 3, normal gait, CN II-XII grossly intact.  MENTAL STATUS:  Appropriate. SKIN:  Warm, dry and intact.    Results: No results found for this or any previous visit (from the past 24 hours).  I have reviewed referring/prior physicians notes  I have reviewed urinalysis  I have reviewed prior testosterone  levels  Assessment: -ED, longstanding.  Patient has adequate management with daily Cialis   -BPH, on both Cialis  and tamsulosin.  He has been voiding better since he started the Cialis   -Low testosterone  level.  Managed elsewhere.  Last trough testosterone  level was supra therapeutic.  Last hemoglobin checked in October 2024 was normal.  I do not see prolactin levels in the past.   Plan: -I would suggest the patient decrease his testosterone  level to 100 mg IM weekly.  I will leave this up to him and his PCP to do this  -I think it is fine for him to try to wean off the tamsulosin, being on 2 medications for BPH  -I would recommend a prolactin level in addition to peak and trough testosterone  levels while on testosterone .  Also, would recommend close follow-up of hemoglobin/hematocrit as he is on testosterone  and has mild sleep apnea  -He is comfortable coming back here on an as needed basis

## 2023-12-24 ENCOUNTER — Ambulatory Visit: Admitting: Urology

## 2023-12-24 VITALS — BP 123/83 | HR 84 | Ht 69.0 in | Wt 174.0 lb

## 2023-12-24 DIAGNOSIS — E291 Testicular hypofunction: Secondary | ICD-10-CM | POA: Diagnosis not present

## 2023-12-24 DIAGNOSIS — N5201 Erectile dysfunction due to arterial insufficiency: Secondary | ICD-10-CM | POA: Diagnosis not present

## 2023-12-24 DIAGNOSIS — N400 Enlarged prostate without lower urinary tract symptoms: Secondary | ICD-10-CM

## 2024-01-28 ENCOUNTER — Encounter: Admitting: Nurse Practitioner

## 2024-02-26 ENCOUNTER — Telehealth: Payer: Self-pay | Admitting: Radiation Oncology

## 2024-02-26 NOTE — Telephone Encounter (Signed)
 Received medical record request from [facility] . Forwarded request to [employer].

## 2024-03-10 ENCOUNTER — Other Ambulatory Visit (HOSPITAL_COMMUNITY): Payer: Self-pay

## 2024-03-14 ENCOUNTER — Telehealth: Payer: Self-pay

## 2024-03-14 NOTE — Telephone Encounter (Signed)
 Pharmacy Patient Advocate Encounter  Received notification from [employer] that Prior Authorization for Testosterone  Cypionate 200MG /ML intramuscular solution has been APPROVED from 7.19.25 to 8.18.26. Ran test claim,  This test claim was processed through [facility]- copay amounts may vary at other pharmacies due to pharmacy/plan contracts, or as the patient moves through the different stages of their insurance plan.   PA #/Case ID/Reference #: [id number]

## 2024-04-15 ENCOUNTER — Encounter: Payer: Self-pay | Admitting: Nurse Practitioner

## 2024-04-15 ENCOUNTER — Other Ambulatory Visit: Payer: Self-pay | Admitting: Nurse Practitioner

## 2024-04-15 DIAGNOSIS — E291 Testicular hypofunction: Secondary | ICD-10-CM

## 2024-04-16 NOTE — Telephone Encounter (Signed)
 Last apt 11/19/23

## 2024-04-17 MED ORDER — TESTOSTERONE CYPIONATE 200 MG/ML IM SOLN: 200.0000 mg | INTRAMUSCULAR | 2 refills | Status: DC

## 2024-05-15 ENCOUNTER — Other Ambulatory Visit: Payer: Self-pay | Admitting: Nurse Practitioner

## 2024-05-15 DIAGNOSIS — E039 Hypothyroidism, unspecified: Secondary | ICD-10-CM

## 2024-05-27 ENCOUNTER — Encounter: Payer: Self-pay | Admitting: Nurse Practitioner

## 2024-05-27 ENCOUNTER — Ambulatory Visit: Payer: Self-pay | Admitting: Nurse Practitioner

## 2024-05-27 VITALS — BP 120/84 | HR 72 | Ht 69.0 in | Wt 174.2 lb

## 2024-05-27 DIAGNOSIS — R110 Nausea: Secondary | ICD-10-CM

## 2024-05-27 DIAGNOSIS — Z0000 Encounter for general adult medical examination without abnormal findings: Secondary | ICD-10-CM | POA: Diagnosis not present

## 2024-05-27 DIAGNOSIS — E782 Mixed hyperlipidemia: Secondary | ICD-10-CM

## 2024-05-27 DIAGNOSIS — E6609 Other obesity due to excess calories: Secondary | ICD-10-CM

## 2024-05-27 DIAGNOSIS — E039 Hypothyroidism, unspecified: Secondary | ICD-10-CM | POA: Diagnosis not present

## 2024-05-27 DIAGNOSIS — E291 Testicular hypofunction: Secondary | ICD-10-CM

## 2024-05-27 DIAGNOSIS — E66811 Obesity, class 1: Secondary | ICD-10-CM | POA: Diagnosis not present

## 2024-05-27 DIAGNOSIS — Z6834 Body mass index (BMI) 34.0-34.9, adult: Secondary | ICD-10-CM

## 2024-05-27 MED ORDER — ONDANSETRON 8 MG PO TBDP: 8.0000 mg | ORAL_TABLET | Freq: Three times a day (TID) | ORAL | 2 refills | Status: AC | PRN

## 2024-05-27 MED ORDER — TESTOSTERONE CYPIONATE 200 MG/ML IM SOLN
200.0000 mg | INTRAMUSCULAR | 1 refills | Status: AC
Start: 2024-05-27 — End: ?

## 2024-05-27 NOTE — Progress Notes (Signed)
 VISIT TYPE: CPE on 05/27/2024 Today's Vitals   05/27/24 1334  BP: 120/84  Pulse: 72  Weight: 174 lb 3.2 oz (79 kg)  Height: 5' 9 (1.753 m)   Body mass index is 25.72 kg/m. BP 120/84   Pulse 72   Ht 5' 9 (1.753 m)   Wt 174 lb 3.2 oz (79 kg)   BMI 25.72 kg/m    Subjective:    Patient ID: Gerald [name], male    DOB: 11-13-1970, 54 y.o.   MRN: [id number]  HPI:  History of Present Illness Gerald [name] is a 53 year old male who presents for an annual physical exam.  Recent lab work conducted at the [facility] shows traces of ketones in his urine and low LDL levels. He is not on any special restrictive diet but is taking Zepbound , which he attributes to the ketones. He has maintained his goal weight of 170-175 pounds for almost a year by spacing his medication doses and monitoring his weight closely.  He experiences intermittent nausea once or twice a week, described as 'waves of nausea.' He has been using Zofran  to manage these symptoms but has run out and requests a prescription. He estimates needing four to six tablets a month.  He reports ongoing issues with his elbow, experiencing pain that radiates into his hands. He is awaiting an appointment with a physiatrist and has been using a brace, ointments, and hot/cold packs without significant relief.  He mentions experiencing pain in his groin area when coughing and notes that he was constipated at the time. He monitors his bowel movements and notes that constipation correlates with the pain. He manages constipation with hydration and laxatives.  He is on testosterone  therapy, injecting 200 mg weekly, and has encountered issues with pharmacy refills. He is also taking 15 mg of meloxicam  daily for pain management.  He has a history of back surgery with fusion from S1 to L4, which he has recovered well from, maintaining good mobility without significant limitations. No chest pain, shortness of breath, or new lumps, bumps, or moles  noted. Pertinent items are noted in HPI.  Most Recent Depression Screen:     05/27/2024    1:44 PM 05/27/2024    1:31 PM 06/08/2023    9:37 AM 06/26/2022   10:44 AM 11/07/2021   10:37 AM  Depression screen PHQ 2/9  Decreased Interest 0 0 0 0 0  Down, Depressed, Hopeless 0 0 0 0 0  PHQ - 2 Score 0 0 0 0 0  Altered sleeping 0      Tired, decreased energy 0      Change in appetite 0      Feeling bad or failure about yourself  0      Trouble concentrating 0      Moving slowly or fidgety/restless 0      Suicidal thoughts 0      PHQ-9 Score 0          Data saved with a previous flowsheet row definition   Most Recent Anxiety Screen:     No data to display         Most Recent Falls Screen:    05/27/2024    1:31 PM 06/08/2023    9:37 AM 06/26/2022   10:44 AM 11/07/2021   10:36 AM  Fall Risk   Falls in the past year? 0 1 0 0  Number falls in past yr: 0 0 0 0  Injury with Fall? 0 1  0 0  Risk for fall due to : No Fall Risks No Fall Risks No Fall Risks   Follow up Falls evaluation completed Falls evaluation completed Falls evaluation completed  Falls evaluation completed      Data saved with a previous flowsheet row definition    Past medical history, surgical history, medications, allergies, family history and social history reviewed with patient today and changes made to appropriate areas of the chart.  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   Dehydration 01/29/2021   ED (erectile dysfunction)    Fatigue 06/17/2023   High blood triglycerides 01/18/2023   Left elbow pain 07/12/2022   Mucositis due to antineoplastic therapy 03/31/2021   Oropharyngeal cancer (HCC) 09/29/2021   Sleep apnea    Tonsillar mass 11/16/2020   Weight loss, unintentional 01/04/2021   Medications:  Current Outpatient Medications on File Prior to Visit  Medication Sig   levothyroxine  (SYNTHROID ) 112 MCG  tablet TAKE 1 TABLET DAILY   meloxicam  (MOBIC ) 15 MG tablet TAKE ONE TABLET BY MOUTH DAILY AS NEEDED PAIN   mupirocin  ointment (BACTROBAN ) 2 % Apply 1 Application topically 2 (two) times daily.   tadalafil  (CIALIS ) 5 MG tablet 5mg  once daily.   tirzepatide  (ZEPBOUND ) 15 MG/0.5ML Pen Inject 15 mg into the skin once a week.   triamcinolone  ointment (KENALOG ) 0.5 % Apply 1 Application topically 2 (two) times daily.   Vitamin D , Ergocalciferol , (DRISDOL ) 1.25 MG (50000 UNIT) CAPS capsule Take 1 capsule (50,000 Units total) by mouth every 7 (seven) days.   [DISCONTINUED] fluticasone  (FLONASE ) 50 MCG/ACT nasal spray Place 2 sprays into both nostrils daily.   [DISCONTINUED] sildenafil  (VIAGRA ) 100 MG tablet Take 1 tablet (100 mg total) by mouth daily as needed.   No current facility-administered medications on file prior to visit.   Surgical History:  Past Surgical History:  Procedure Laterality Date   DIRECT LARYNGOSCOPY N/A 11/22/2020   Procedure: DIRECT LARYNGOSCOPY WITH BIOPSY;  Surgeon: Karis Clunes, MD;  Location: [facility];  Service: ENT;  Laterality: N/A;   FOOT SURGERY  2000   extra nivicular removal   IR GASTROSTOMY TUBE MOD SED  12/24/2020   IR GASTROSTOMY TUBE REMOVAL  04/11/2021   IR IMAGING GUIDED PORT INSERTION  12/24/2020   IR REMOVAL TUN ACCESS W/ PORT W/O FL MOD SED  08/25/2021   SPINE SURGERY     TONSILLECTOMY Left 11/22/2020   Procedure: LEFT TONSILLECTOMY;  Surgeon: Karis Clunes, MD;  Location: [facility];  Service: ENT;  Laterality: Left;   Allergies:  No Known Allergies Social History:  Social History   Socioeconomic History   Marital status: Married    Spouse name: Not on file   Number of children: Not on file   Years of education: Not on file   Highest education level: Not on file  Occupational History   Not on file  Tobacco Use   Smoking status: Never   Smokeless tobacco: Never  Vaping Use   Vaping status: Never Used  Substance and Sexual  Activity   Alcohol use: Not Currently    Comment: rare   Drug use: Never   Sexual activity: Not Currently  Other Topics Concern   Not on file  Social History Narrative   Not on file   Social Drivers of Health   Financial Resource Strain: Low Risk  (05/27/2024)   Overall Financial Resource  Strain (CARDIA)    Difficulty of Paying Living Expenses: Not very hard  Food Insecurity: No Food Insecurity (05/27/2024)   Hunger Vital Sign    Worried About Running Out of Food in the Last Year: Never true    Ran Out of Food in the Last Year: Never true  Transportation Needs: No Transportation Needs (05/27/2024)   PRAPARE - Administrator, Civil Service (Medical): No    Lack of Transportation (Non-Medical): No  Physical Activity: Inactive (05/27/2024)   Exercise Vital Sign    Days of Exercise per Week: 0 days    Minutes of Exercise per Session: 0 min  Stress: Stress Concern Present (05/27/2024)   [employer] of Occupational Health - Occupational Stress Questionnaire    Feeling of Stress: To some extent  Social Connections: Unknown (05/27/2024)   Social Connection and Isolation Panel    Frequency of Communication with Friends and Family: Once a week    Frequency of Social Gatherings with Friends and Family: Once a week    Attends Religious Services: Never    Database Administrator or Organizations: Patient declined    Attends Banker Meetings: Patient declined    Marital Status: Married  Catering Manager Violence: Not At Risk (05/27/2024)   Humiliation, Afraid, Rape, and Kick questionnaire    Fear of Current or Ex-Partner: No    Emotionally Abused: No    Physically Abused: No    Sexually Abused: No   Social History   Tobacco Use  Smoking Status Never  Smokeless Tobacco Never   Social History   Substance and Sexual Activity  Alcohol Use Not Currently   Comment: rare   Family History:  History reviewed. No pertinent family history.     Objective:     BP 120/84   Pulse 72   Ht 5' 9 (1.753 m)   Wt 174 lb 3.2 oz (79 kg)   BMI 25.72 kg/m   Wt Readings from Last 3 Encounters:  05/27/24 174 lb 3.2 oz (79 kg)  12/24/23 174 lb (78.9 kg)  11/19/23 184 lb 12.8 oz (83.8 kg)    Physical Exam Vitals and nursing note reviewed.  Constitutional:      Appearance: Normal appearance.  HENT:     Head: Normocephalic and atraumatic.     Right Ear: Hearing, tympanic membrane, ear canal and external ear normal.     Left Ear: Hearing, tympanic membrane, ear canal and external ear normal.     Nose: Nose normal.     Right Sinus: No maxillary sinus tenderness or frontal sinus tenderness.     Left Sinus: No maxillary sinus tenderness or frontal sinus tenderness.     Mouth/Throat:     Lips: Pink.     Mouth: Mucous membranes are moist.     Pharynx: Oropharynx is clear.  Eyes:     General: Lids are normal. Vision grossly intact.     Extraocular Movements: Extraocular movements intact.     Pupils: Pupils are equal, round, and reactive to light.     Funduscopic exam:    Right eye: No hemorrhage. Red reflex present.        Left eye: No hemorrhage. Red reflex present.    Visual Fields: Right eye visual fields normal and left eye visual fields normal.  Neck:     Thyroid : No thyromegaly.     Vascular: No carotid bruit or JVD.  Cardiovascular:     Rate and Rhythm: Normal rate and  regular rhythm.     Chest Wall: PMI is not displaced.     Pulses: Normal pulses.          Dorsalis pedis pulses are 2+ on the right side and 2+ on the left side.       Posterior tibial pulses are 2+ on the right side and 2+ on the left side.     Heart sounds: Normal heart sounds. No murmur heard. Pulmonary:     Effort: Pulmonary effort is normal. No respiratory distress.     Breath sounds: Normal breath sounds.  Chest:  Breasts:    Breasts are symmetrical.  Abdominal:     General: Bowel sounds are normal. There is no distension or abdominal bruit.     Palpations: Abdomen  is soft. There is no hepatomegaly, splenomegaly or mass.     Tenderness: There is no abdominal tenderness. There is no right CVA tenderness, left CVA tenderness, guarding or rebound.     Hernia: There is no hernia in the left inguinal area or right inguinal area.  Genitourinary:    Penis: Normal.      Testes: Normal.     Epididymis:     Right: Normal.     Left: Normal.  Musculoskeletal:        General: Normal range of motion.     Cervical back: Full passive range of motion without pain and neck supple. No tenderness. No spinous process tenderness or muscular tenderness.     Right lower leg: No edema.     Left lower leg: No edema.  Feet:     Right foot:     Toenail Condition: Right toenails are normal.     Left foot:     Toenail Condition: Left toenails are normal.  Lymphadenopathy:     Cervical: No cervical adenopathy.     Upper Body:     Right upper body: No supraclavicular adenopathy.     Left upper body: No supraclavicular adenopathy.     Lower Body: No right inguinal adenopathy. No left inguinal adenopathy.  Skin:    General: Skin is warm and dry.     Capillary Refill: Capillary refill takes less than 2 seconds.     Nails: There is no clubbing.  Neurological:     General: No focal deficit present.     Mental Status: He is alert and oriented to person, place, and time.     Cranial Nerves: No cranial nerve deficit.     Sensory: Sensation is intact. No sensory deficit.     Motor: Motor function is intact. No weakness.     Coordination: Coordination is intact. Coordination normal.     Gait: Gait is intact. Gait normal.  Psychiatric:        Attention and Perception: Attention normal.        Mood and Affect: Mood normal.        Speech: Speech normal.        Behavior: Behavior normal. Behavior is cooperative.        Cognition and Memory: Cognition and memory normal.      Results for orders placed or performed in visit on 06/12/23  CBC and differential   Collection Time:  04/26/23 12:00 AM  Result Value Ref Range   Hemoglobin 16.1 13.5 - 17.5   HCT 44 41 - 53   Platelets 211 150 - 400 K/uL   WBC 7.0   CBC   Collection Time: 04/26/23 12:00 AM  Result Value  Ref Range   RBC 4.57 3.87 - 5.11  Testosterone    Collection Time: 04/26/23 12:00 AM  Result Value Ref Range   Testosterone  1,362.29       Assessment & Plan:   Problem List Items Addressed This Visit     Hypothyroidism   Currently managed with levothyroxine  112mcg daily with no concerning symptoms present. [facility] will [facility]. Monitor closely with weight loss to ensure appropriate dosing is maintained.       Testosterone  deficiency in male   Significant deficiency requiring testosterone  injections once a week. Recent evaluation with urology recommended consider spacing out the dosage. Labs are steady. Will continue to monitor.       Relevant Medications   testosterone  cypionate (DEPOTESTOSTERONE CYPIONATE) 200 MG/ML injection   Class 1 obesity due to excess calories with serious comorbidity and body mass index (BMI) of 34.0 to 34.9 in adult   BMI today 25.72. Excellent results with diet, exercise, and use of zepbound  for weight management. He is spacing his dosing apart to help maintain his weight and avoid losing excessive amounts. No alarm symptoms at this time. Continue current therapy and consider lowering dosage if additional weight loss occurs.       Mixed hyperlipidemia   [facility] show [facility] LDL levels. Weight management with zepbound  in addition to diet and activity management appear to be effective. Will plan to continue at this time and monitor closely      Other Visit Diagnoses       Nausea    -  Primary   Relevant Medications   ondansetron  (ZOFRAN -ODT) 8 MG disintegrating tablet       .[email or web address]  Follow up plan: NEXT PREVENTATIVE PHYSICAL DUE IN 1 YEAR.   LABORATORY TESTING:  Health maintenance labs ordered today, if  applicable.    PATIENT COUNSELING:   For all adult patients, I recommend A well balanced diet low in saturated fats, cholesterol, and moderation in carbohydrates.  This can be as simple as monitoring portion sizes and cutting back on sugary beverages such as soda and juice to start with.    Daily water  consumption of at least 64 ounces.  Physical activity at least 180 minutes per week, if just starting out.  This can be as simple as taking the stairs instead of the elevator and walking 2-3 laps around the office  purposefully every day.   STD protection, partner selection, and regular testing if high risk.  Limited consumption of alcoholic beverages if alcohol is consumed. For men, I recommend no more than 14 alcoholic beverages per week, spread out throughout the week (max 2 per day). Avoid binge drinking or consuming large quantities of alcohol in one setting.  Please let me know if you feel you may need help with reduction or quitting alcohol consumption.   Avoidance of nicotine, if used. Please let me know if you feel you may need help with reduction or quitting nicotine use.   Daily mental health attention. This can be in the form of 5 minute daily meditation, prayer, journaling, yoga, reflection, etc.  Purposeful attention to your emotions and mental state can significantly improve your overall wellbeing and Health.  Please know that I am here to help you with all of your health care goals and am happy to work with you to find a solution that works best for you.  The greatest advice I have received with any changes in life are to take  it one step at a time, that even means if all you can focus on is the next 60 seconds, then do that and celebrate your victories.  With any changes in life, you will have set backs, and that is OK. The important thing to remember is, if you have a set back, it is not a failure, it is an opportunity to try again!  Health Maintenance  Recommendations Screening Testing Mammogram Every 1 -2 years based on history and risk factors Starting at age 18 Pap Smear Ages 21-39 every 3 years Ages 4-65 every 5 years with HPV testing More frequent testing may be required based on results and history Colon Cancer Screening Every 1-10 years based on test performed, risk factors, and history Starting at age 84 Bone Density Screening Every 2-10 years based on history Starting at age 1 for women Recommendations for men differ based on medication usage, history, and risk factors AAA Screening One time ultrasound Men 26-73 years old who have every smoked Lung Cancer Screening Low Dose Lung CT every 12 months Age 25-80 years with a 30 pack-year smoking history who still smoke or who have quit within the last 15 years   Screening Labs Routine  Labs: Complete Blood Count (CBC), Complete Metabolic Panel (CMP), Cholesterol (Lipid Panel) Every 6-12 months based on history and medications May be recommended more frequently based on current conditions or previous results Hemoglobin A1c Lab Every 3-12 months based on history and previous results Starting at age 18 or earlier with diagnosis of diabetes, high cholesterol, BMI >26, and/or risk factors Frequent monitoring for patients with diabetes to ensure blood sugar control Thyroid  Panel (TSH) Every 6 months based on history, symptoms, and risk factors May be repeated more often if on medication HIV One time testing for all patients 26 and older May be repeated more frequently for patients with increased risk factors or exposure Hepatitis C One time testing for all patients 75 and older May be repeated more frequently for patients with increased risk factors or exposure Gonorrhea, Chlamydia Every 12 months for all sexually active persons 13-24 years Additional monitoring may be recommended for those who are considered high risk or who have symptoms Every 12 months for any woman  on birth control, regardless of sexual activity PSA Men 18-24 years old with risk factors Additional screening may be recommended from age 88-69 based on risk factors, symptoms, and history  Vaccine Recommendations Tetanus Booster All adults every 10 years Flu Vaccine All patients 6 months and older every year COVID Vaccine All patients 12 years and older Initial dosing with booster May recommend additional booster based on age and health history HPV Vaccine 2 doses all patients age 27-26 Dosing may be considered for patients over 26 Shingles Vaccine (Shingrix) 2 doses all adults 55 years and older Pneumonia (Pneumovax 39) All adults 65 years and older May recommend earlier dosing based on health history One year apart from Prevnar 61 Pneumonia (Prevnar 83) All adults 65 years and older Dosed 1 year after Pneumovax 23 Pneumonia (Prevnar 20) One time alternative to the two dosing of 13 and 23 For all adults with initial dose of 23, 20 is recommended 1 year later For all adults with initial dose of 13, 23 is still recommended as second option 1 year later  Additional Screening, Testing, and Vaccinations may be recommended on an individualized basis based on family history, health history, risk factors, and/or exposure.

## 2024-05-27 NOTE — Patient Instructions (Signed)

## 2024-06-02 ENCOUNTER — Encounter: Payer: Self-pay | Admitting: Nurse Practitioner

## 2024-06-02 NOTE — Assessment & Plan Note (Signed)
 BMI today 25.72. Excellent results with diet, exercise, and use of zepbound  for weight management. He is spacing his dosing apart to help maintain his weight and avoid losing excessive amounts. No alarm symptoms at this time. Continue current therapy and consider lowering dosage if additional weight loss occurs.

## 2024-06-02 NOTE — Assessment & Plan Note (Signed)
 Significant deficiency requiring testosterone  injections once a week. Recent evaluation with urology recommended consider spacing out the dosage. Labs are steady. Will continue to monitor.

## 2024-06-02 NOTE — Assessment & Plan Note (Signed)
 Currently managed with levothyroxine  112mcg daily with no concerning symptoms present. [facility] will [facility]. Monitor closely with weight loss to ensure appropriate dosing is maintained.

## 2024-06-02 NOTE — Assessment & Plan Note (Signed)
 [facility] show [facility] LDL levels. Weight management with zepbound  in addition to diet and activity management appear to be effective. Will plan to continue at this time and monitor closely

## 2024-07-28 ENCOUNTER — Telehealth: Payer: Self-pay | Admitting: Pharmacy Technician

## 2024-07-28 ENCOUNTER — Other Ambulatory Visit (HOSPITAL_COMMUNITY): Payer: Self-pay

## 2024-07-28 NOTE — Telephone Encounter (Signed)
 Pharmacy Patient Advocate Encounter  Received notification from [employer] that Prior Authorization for Zepbound  has been closed.       PA #/Case ID/Reference #: [id number]   He must enroll in virtual care program before PA can be submitted. If he has any questions about enrollment he should contact his health plan directly.

## 2024-07-28 NOTE — Telephone Encounter (Signed)
 Pharmacy Patient Advocate Encounter   Received notification from [employer] that prior authorization for Zepbound  is due for renewal.   Insurance verification completed.   The patient is insured through [employer].  Action: PA required; PA submitted to above mentioned insurance via Latent Key/confirmation #/[id number] [employer] Status is pending

## 2024-07-30 ENCOUNTER — Other Ambulatory Visit (HOSPITAL_COMMUNITY): Payer: Self-pay

## 2024-07-30 NOTE — Telephone Encounter (Signed)
 Pharmacy Patient Advocate Encounter  Received notification from [employer] that Prior Authorization for Zepbound  has been APPROVED from 06/30/2024 to 07/30/2025. Ran test claim, Copay is $24.99. This test claim was processed through [facility]- copay amounts may vary at other pharmacies due to pharmacy/plan contracts, or as the patient moves through the different stages of their insurance plan.   PA #/Case ID/Reference #: [id number]

## 2024-07-30 NOTE — Telephone Encounter (Signed)
 Pharmacy Patient Advocate Encounter   Received notification from [employer]My[employer]eds that prior authorization for Zepbound  is required/requested.   Insurance verification completed.   The patient is insured through [employer].   Per test claim: PA required; PA submitted to above mentioned insurance via Latent Key/confirmation #/[id number] Status is pending

## 2025-06-01 ENCOUNTER — Encounter: Admitting: Nurse Practitioner
